# Patient Record
Sex: Female | Born: 1950 | Race: White | Hispanic: No | Smoking: Never smoker
Health system: Southern US, Community
[De-identification: ages and names within clinical notes are randomized; demographics above are authoritative.]

## PROBLEM LIST (undated history)

## (undated) DIAGNOSIS — T8859XA Other complications of anesthesia, initial encounter: Secondary | ICD-10-CM

## (undated) DIAGNOSIS — M797 Fibromyalgia: Secondary | ICD-10-CM

## (undated) DIAGNOSIS — I10 Essential (primary) hypertension: Secondary | ICD-10-CM

## (undated) DIAGNOSIS — G8929 Other chronic pain: Secondary | ICD-10-CM

## (undated) DIAGNOSIS — Z9889 Other specified postprocedural states: Secondary | ICD-10-CM

## (undated) DIAGNOSIS — M549 Dorsalgia, unspecified: Secondary | ICD-10-CM

## (undated) DIAGNOSIS — T4145XA Adverse effect of unspecified anesthetic, initial encounter: Secondary | ICD-10-CM

## (undated) DIAGNOSIS — R112 Nausea with vomiting, unspecified: Secondary | ICD-10-CM

## (undated) DIAGNOSIS — M545 Low back pain, unspecified: Secondary | ICD-10-CM

## (undated) DIAGNOSIS — M542 Cervicalgia: Secondary | ICD-10-CM

## (undated) DIAGNOSIS — D7589 Other specified diseases of blood and blood-forming organs: Secondary | ICD-10-CM

## (undated) DIAGNOSIS — K5090 Crohn's disease, unspecified, without complications: Secondary | ICD-10-CM

## (undated) DIAGNOSIS — D649 Anemia, unspecified: Secondary | ICD-10-CM

## (undated) DIAGNOSIS — K219 Gastro-esophageal reflux disease without esophagitis: Secondary | ICD-10-CM

## (undated) DIAGNOSIS — M8580 Other specified disorders of bone density and structure, unspecified site: Secondary | ICD-10-CM

## (undated) DIAGNOSIS — C4490 Unspecified malignant neoplasm of skin, unspecified: Secondary | ICD-10-CM

## (undated) HISTORY — DX: Fibromyalgia: M79.7

## (undated) HISTORY — DX: Unspecified malignant neoplasm of skin, unspecified: C44.90

## (undated) HISTORY — PX: BREAST EXCISIONAL BIOPSY: SUR124

## (undated) HISTORY — DX: Other specified disorders of bone density and structure, unspecified site: M85.80

## (undated) HISTORY — DX: Other specified diseases of blood and blood-forming organs: D75.89

## (undated) HISTORY — DX: Anemia, unspecified: D64.9

## (undated) HISTORY — DX: Low back pain: M54.5

## (undated) HISTORY — DX: Crohn's disease, unspecified, without complications: K50.90

## (undated) HISTORY — DX: Low back pain, unspecified: M54.50

## (undated) HISTORY — PX: SMALL INTESTINE SURGERY: SHX150

---

## 1988-02-12 HISTORY — PX: APPENDECTOMY: SHX54

## 2003-05-02 ENCOUNTER — Encounter: Admission: RE | Admit: 2003-05-02 | Discharge: 2003-05-02 | Payer: Self-pay | Admitting: Neurological Surgery

## 2004-08-27 ENCOUNTER — Ambulatory Visit: Payer: Self-pay | Admitting: Internal Medicine

## 2004-09-27 ENCOUNTER — Ambulatory Visit: Payer: Self-pay | Admitting: Internal Medicine

## 2005-01-29 ENCOUNTER — Ambulatory Visit: Payer: Self-pay | Admitting: Internal Medicine

## 2005-02-11 HISTORY — PX: OTHER SURGICAL HISTORY: SHX169

## 2005-04-08 ENCOUNTER — Inpatient Hospital Stay (HOSPITAL_COMMUNITY): Admission: EM | Admit: 2005-04-08 | Discharge: 2005-04-16 | Payer: Self-pay

## 2005-04-08 DIAGNOSIS — A4901 Methicillin susceptible Staphylococcus aureus infection, unspecified site: Secondary | ICD-10-CM | POA: Insufficient documentation

## 2005-04-08 DIAGNOSIS — M8618 Other acute osteomyelitis, other site: Secondary | ICD-10-CM | POA: Insufficient documentation

## 2005-04-09 ENCOUNTER — Ambulatory Visit: Payer: Self-pay | Admitting: Internal Medicine

## 2005-05-07 ENCOUNTER — Ambulatory Visit: Payer: Self-pay | Admitting: Infectious Diseases

## 2005-05-13 ENCOUNTER — Ambulatory Visit (HOSPITAL_COMMUNITY): Admission: RE | Admit: 2005-05-13 | Discharge: 2005-05-13 | Payer: Self-pay | Admitting: Anesthesiology

## 2005-06-13 ENCOUNTER — Ambulatory Visit: Payer: Self-pay | Admitting: Internal Medicine

## 2005-06-26 ENCOUNTER — Ambulatory Visit: Payer: Self-pay | Admitting: Infectious Diseases

## 2005-07-01 ENCOUNTER — Encounter: Admission: RE | Admit: 2005-07-01 | Discharge: 2005-07-01 | Payer: Self-pay | Admitting: Neurological Surgery

## 2005-08-05 ENCOUNTER — Ambulatory Visit: Payer: Self-pay | Admitting: Infectious Diseases

## 2005-08-19 ENCOUNTER — Ambulatory Visit: Payer: Self-pay | Admitting: Internal Medicine

## 2005-11-04 ENCOUNTER — Ambulatory Visit: Payer: Self-pay | Admitting: Infectious Diseases

## 2005-11-07 ENCOUNTER — Encounter: Admission: RE | Admit: 2005-11-07 | Discharge: 2005-11-07 | Payer: Self-pay | Admitting: Neurological Surgery

## 2005-11-12 ENCOUNTER — Ambulatory Visit: Payer: Self-pay | Admitting: Infectious Diseases

## 2005-11-12 DIAGNOSIS — M545 Low back pain: Secondary | ICD-10-CM

## 2005-11-19 DIAGNOSIS — IMO0001 Reserved for inherently not codable concepts without codable children: Secondary | ICD-10-CM

## 2005-11-19 DIAGNOSIS — D539 Nutritional anemia, unspecified: Secondary | ICD-10-CM

## 2005-11-19 DIAGNOSIS — R7989 Other specified abnormal findings of blood chemistry: Secondary | ICD-10-CM | POA: Insufficient documentation

## 2006-01-29 ENCOUNTER — Ambulatory Visit: Payer: Self-pay | Admitting: Infectious Diseases

## 2006-03-18 ENCOUNTER — Ambulatory Visit: Payer: Self-pay | Admitting: Infectious Diseases

## 2006-03-18 LAB — CONVERTED CEMR LAB
ALT: 20 units/L (ref 0–35)
AST: 17 units/L (ref 0–37)
Albumin: 4.1 g/dL (ref 3.5–5.2)
Alkaline Phosphatase: 117 units/L (ref 39–117)
BUN: 12 mg/dL (ref 6–23)
Basophils Absolute: 0 10*3/uL (ref 0.0–0.1)
Basophils Relative: 0 % (ref 0–1)
CO2: 27 meq/L (ref 19–32)
Calcium: 9.1 mg/dL (ref 8.4–10.5)
Chloride: 105 meq/L (ref 96–112)
Creatinine, Ser: 0.78 mg/dL (ref 0.40–1.20)
Eosinophils Absolute: 0.1 10*3/uL (ref 0.0–0.7)
Eosinophils Relative: 1 % (ref 0–5)
Glucose, Bld: 106 mg/dL — ABNORMAL HIGH (ref 70–99)
HCT: 36 % (ref 36.0–46.0)
Hemoglobin: 11.6 g/dL — ABNORMAL LOW (ref 12.0–15.0)
Lymphocytes Relative: 26 % (ref 12–46)
Lymphs Abs: 1.9 10*3/uL (ref 0.7–3.3)
MCV: 88.9 fL (ref 78.0–100.0)
Monocytes Absolute: 0.4 10*3/uL (ref 0.2–0.7)
Monocytes Relative: 6 % (ref 3–11)
Neutro Abs: 5.1 10*3/uL (ref 1.7–7.7)
Neutrophils Relative %: 68 % (ref 43–77)
Platelets: 272 10*3/uL (ref 150–400)
Potassium: 3.8 meq/L (ref 3.5–5.3)
RBC: 4.05 M/uL (ref 3.87–5.11)
RDW: 13.7 % (ref 11.5–14.0)
Sed Rate: 48 mm/hr — ABNORMAL HIGH (ref 0–22)
Sodium: 143 meq/L (ref 135–145)
Total Bilirubin: 0.9 mg/dL (ref 0.3–1.2)
Total Protein: 7.3 g/dL (ref 6.0–8.3)
WBC: 7.5 10*3/uL (ref 4.0–10.5)

## 2006-04-16 ENCOUNTER — Ambulatory Visit: Payer: Self-pay | Admitting: Infectious Diseases

## 2006-07-24 ENCOUNTER — Ambulatory Visit: Payer: Self-pay | Admitting: Infectious Diseases

## 2006-07-24 LAB — CONVERTED CEMR LAB
ALT: 14 units/L (ref 0–35)
AST: 15 units/L (ref 0–37)
Albumin: 4.1 g/dL (ref 3.5–5.2)
Alkaline Phosphatase: 98 units/L (ref 39–117)
BUN: 12 mg/dL (ref 6–23)
Basophils Absolute: 0 10*3/uL (ref 0.0–0.1)
Basophils Relative: 0 % (ref 0–1)
CO2: 27 meq/L (ref 19–32)
Calcium: 9.7 mg/dL (ref 8.4–10.5)
Chloride: 104 meq/L (ref 96–112)
Creatinine, Ser: 0.63 mg/dL (ref 0.40–1.20)
Eosinophils Absolute: 0 10*3/uL (ref 0.0–0.7)
Eosinophils Relative: 1 % (ref 0–5)
Glucose, Bld: 93 mg/dL (ref 70–99)
HCT: 36.5 % (ref 36.0–46.0)
Hemoglobin: 11.8 g/dL — ABNORMAL LOW (ref 12.0–15.0)
Lymphocytes Relative: 23 % (ref 12–46)
Lymphs Abs: 1.3 10*3/uL (ref 0.7–3.3)
MCHC: 32.3 g/dL (ref 30.0–36.0)
MCV: 91 fL (ref 78.0–100.0)
Monocytes Absolute: 0.5 10*3/uL (ref 0.2–0.7)
Monocytes Relative: 9 % (ref 3–11)
Neutro Abs: 3.9 10*3/uL (ref 1.7–7.7)
Platelets: 290 10*3/uL (ref 150–400)
Potassium: 3.7 meq/L (ref 3.5–5.3)
RBC: 4.01 M/uL (ref 3.87–5.11)
RDW: 13.9 % (ref 11.5–14.0)
Sed Rate: 41 mm/hr — ABNORMAL HIGH (ref 0–22)
Sodium: 141 meq/L (ref 135–145)
Total Bilirubin: 1.3 mg/dL — ABNORMAL HIGH (ref 0.3–1.2)
Total Protein: 7.3 g/dL (ref 6.0–8.3)
WBC: 5.8 10*3/uL (ref 4.0–10.5)

## 2006-07-30 ENCOUNTER — Ambulatory Visit: Payer: Self-pay | Admitting: Infectious Diseases

## 2006-08-21 ENCOUNTER — Ambulatory Visit: Payer: Self-pay | Admitting: Urgent Care

## 2006-09-11 ENCOUNTER — Ambulatory Visit: Payer: Self-pay | Admitting: Internal Medicine

## 2006-09-11 ENCOUNTER — Ambulatory Visit (HOSPITAL_COMMUNITY): Admission: RE | Admit: 2006-09-11 | Discharge: 2006-09-11 | Payer: Self-pay | Admitting: Internal Medicine

## 2006-09-11 ENCOUNTER — Encounter: Payer: Self-pay | Admitting: Internal Medicine

## 2006-09-11 HISTORY — PX: COLONOSCOPY: SHX174

## 2006-11-11 ENCOUNTER — Ambulatory Visit: Payer: Self-pay | Admitting: Internal Medicine

## 2006-11-11 LAB — CONVERTED CEMR LAB
Basophils Absolute: 0 10*3/uL (ref 0.0–0.1)
Basophils Relative: 0 % (ref 0–1)
Eosinophils Absolute: 0.1 10*3/uL (ref 0.0–0.7)
Eosinophils Relative: 1 % (ref 0–5)
HCT: 37.6 % (ref 36.0–46.0)
Hemoglobin: 12.3 g/dL (ref 12.0–15.0)
Lymphocytes Relative: 23 % (ref 12–46)
Lymphs Abs: 1.4 10*3/uL (ref 0.7–3.3)
MCHC: 32.7 g/dL (ref 30.0–36.0)
MCV: 91.3 fL (ref 78.0–100.0)
Monocytes Absolute: 0.5 10*3/uL (ref 0.2–0.7)
Monocytes Relative: 8 % (ref 3–11)
Neutro Abs: 4.3 10*3/uL (ref 1.7–7.7)
Neutrophils Relative %: 68 % (ref 43–77)
Platelets: 261 10*3/uL (ref 150–400)
RBC: 4.12 M/uL (ref 3.87–5.11)
RDW: 14.1 % — ABNORMAL HIGH (ref 11.5–14.0)
Sed Rate: 34 mm/hr — ABNORMAL HIGH (ref 0–22)
WBC: 6.3 10*3/uL (ref 4.0–10.5)

## 2006-12-01 ENCOUNTER — Ambulatory Visit: Payer: Self-pay | Admitting: Internal Medicine

## 2007-02-03 ENCOUNTER — Ambulatory Visit: Payer: Self-pay | Admitting: Internal Medicine

## 2007-03-12 ENCOUNTER — Ambulatory Visit: Payer: Self-pay | Admitting: Internal Medicine

## 2007-05-15 ENCOUNTER — Ambulatory Visit: Payer: Self-pay | Admitting: Internal Medicine

## 2007-06-16 ENCOUNTER — Ambulatory Visit: Payer: Self-pay | Admitting: Internal Medicine

## 2007-09-11 DIAGNOSIS — L989 Disorder of the skin and subcutaneous tissue, unspecified: Secondary | ICD-10-CM | POA: Insufficient documentation

## 2007-09-11 DIAGNOSIS — K5000 Crohn's disease of small intestine without complications: Secondary | ICD-10-CM

## 2007-09-11 DIAGNOSIS — R197 Diarrhea, unspecified: Secondary | ICD-10-CM

## 2007-09-11 DIAGNOSIS — Z8719 Personal history of other diseases of the digestive system: Secondary | ICD-10-CM | POA: Insufficient documentation

## 2007-09-11 DIAGNOSIS — G061 Intraspinal abscess and granuloma: Secondary | ICD-10-CM | POA: Insufficient documentation

## 2007-09-11 DIAGNOSIS — I10 Essential (primary) hypertension: Secondary | ICD-10-CM | POA: Insufficient documentation

## 2007-09-21 ENCOUNTER — Encounter: Payer: Self-pay | Admitting: Family Medicine

## 2008-04-29 ENCOUNTER — Ambulatory Visit: Payer: Self-pay | Admitting: Internal Medicine

## 2008-04-29 DIAGNOSIS — K219 Gastro-esophageal reflux disease without esophagitis: Secondary | ICD-10-CM

## 2008-04-29 DIAGNOSIS — M949 Disorder of cartilage, unspecified: Secondary | ICD-10-CM

## 2008-04-29 DIAGNOSIS — M899 Disorder of bone, unspecified: Secondary | ICD-10-CM | POA: Insufficient documentation

## 2008-05-03 ENCOUNTER — Encounter: Payer: Self-pay | Admitting: Internal Medicine

## 2008-05-25 ENCOUNTER — Telehealth (INDEPENDENT_AMBULATORY_CARE_PROVIDER_SITE_OTHER): Payer: Self-pay

## 2008-05-26 ENCOUNTER — Telehealth: Payer: Self-pay | Admitting: Internal Medicine

## 2008-06-20 ENCOUNTER — Encounter: Payer: Self-pay | Admitting: Gastroenterology

## 2008-10-03 ENCOUNTER — Encounter (INDEPENDENT_AMBULATORY_CARE_PROVIDER_SITE_OTHER): Payer: Self-pay

## 2008-10-12 ENCOUNTER — Encounter (INDEPENDENT_AMBULATORY_CARE_PROVIDER_SITE_OTHER): Payer: Self-pay | Admitting: *Deleted

## 2008-11-08 ENCOUNTER — Encounter: Payer: Self-pay | Admitting: Internal Medicine

## 2008-11-15 ENCOUNTER — Encounter: Payer: Self-pay | Admitting: Internal Medicine

## 2008-11-15 ENCOUNTER — Ambulatory Visit (HOSPITAL_COMMUNITY): Admission: RE | Admit: 2008-11-15 | Discharge: 2008-11-15 | Payer: Self-pay | Admitting: Internal Medicine

## 2008-11-23 ENCOUNTER — Encounter: Payer: Self-pay | Admitting: Internal Medicine

## 2008-11-23 LAB — CONVERTED CEMR LAB
Basophils Absolute: 0 10*3/uL (ref 0.0–0.1)
Basophils Relative: 0 % (ref 0–1)
Eosinophils Relative: 0 % (ref 0–5)
Lymphocytes Relative: 13 % (ref 12–46)
MCHC: 32.2 g/dL (ref 30.0–36.0)
Monocytes Absolute: 0.6 10*3/uL (ref 0.1–1.0)
Neutro Abs: 7.7 10*3/uL (ref 1.7–7.7)
Platelets: 288 10*3/uL (ref 150–400)
RDW: 14.3 % (ref 11.5–15.5)
Vitamin B-12: 331 pg/mL (ref 211–911)

## 2008-12-26 ENCOUNTER — Encounter: Payer: Self-pay | Admitting: Urgent Care

## 2008-12-28 ENCOUNTER — Encounter (INDEPENDENT_AMBULATORY_CARE_PROVIDER_SITE_OTHER): Payer: Self-pay

## 2009-01-13 ENCOUNTER — Encounter: Payer: Self-pay | Admitting: Internal Medicine

## 2009-01-18 ENCOUNTER — Telehealth (INDEPENDENT_AMBULATORY_CARE_PROVIDER_SITE_OTHER): Payer: Self-pay

## 2009-02-01 ENCOUNTER — Encounter: Payer: Self-pay | Admitting: Internal Medicine

## 2009-02-13 ENCOUNTER — Encounter: Payer: Self-pay | Admitting: Gastroenterology

## 2009-02-15 ENCOUNTER — Encounter: Payer: Self-pay | Admitting: Gastroenterology

## 2009-02-16 ENCOUNTER — Encounter: Payer: Self-pay | Admitting: Gastroenterology

## 2009-02-17 ENCOUNTER — Encounter: Payer: Self-pay | Admitting: Internal Medicine

## 2009-02-17 LAB — CONVERTED CEMR LAB
Basophils Absolute: 0 10*3/uL (ref 0.0–0.1)
Basophils Relative: 0 % (ref 0–1)
Eosinophils Absolute: 0.1 10*3/uL (ref 0.0–0.7)
Eosinophils Relative: 1 % (ref 0–5)
HCT: 32 % — ABNORMAL LOW (ref 36.0–46.0)
Lymphs Abs: 1.6 10*3/uL (ref 0.7–4.0)
MCV: 88.9 fL (ref 78.0–100.0)
Neutrophils Relative %: 63 % (ref 43–77)
Platelets: 260 10*3/uL (ref 150–400)
RDW: 14.1 % (ref 11.5–15.5)
WBC: 5.7 10*3/uL (ref 4.0–10.5)

## 2009-02-27 ENCOUNTER — Encounter: Payer: Self-pay | Admitting: Internal Medicine

## 2009-02-27 LAB — CONVERTED CEMR LAB
Ferritin: 5 ng/mL — ABNORMAL LOW (ref 10–291)
Folate: >20 ng/mL

## 2009-03-20 ENCOUNTER — Encounter: Payer: Self-pay | Admitting: Internal Medicine

## 2009-03-27 LAB — CONVERTED CEMR LAB
Basophils Absolute: 0.1 10*3/uL (ref 0.0–0.1)
Eosinophils Relative: 9 % — ABNORMAL HIGH (ref 0–5)
HCT: 31.3 % — ABNORMAL LOW (ref 36.0–46.0)
Hemoglobin: 10 g/dL — ABNORMAL LOW (ref 12.0–15.0)
Lymphocytes Relative: 22 % (ref 12–46)
Lymphs Abs: 1.4 10*3/uL (ref 0.7–4.0)
Monocytes Absolute: 0.3 10*3/uL (ref 0.1–1.0)
Monocytes Relative: 5 % (ref 3–12)
Neutro Abs: 4 10*3/uL (ref 1.7–7.7)
RBC: 3.54 M/uL — ABNORMAL LOW (ref 3.87–5.11)
RDW: 14.1 % (ref 11.5–15.5)
WBC: 6.2 10*3/uL (ref 4.0–10.5)

## 2009-03-30 ENCOUNTER — Ambulatory Visit: Payer: Self-pay | Admitting: Internal Medicine

## 2009-03-30 DIAGNOSIS — R109 Unspecified abdominal pain: Secondary | ICD-10-CM | POA: Insufficient documentation

## 2009-03-31 ENCOUNTER — Encounter: Payer: Self-pay | Admitting: Internal Medicine

## 2009-03-31 LAB — CONVERTED CEMR LAB
Chloride: 103 meq/L (ref 96–112)
Glucose, Bld: 93 mg/dL (ref 70–99)
Potassium: 3.9 meq/L (ref 3.5–5.3)
Sodium: 138 meq/L (ref 135–145)

## 2009-04-03 ENCOUNTER — Ambulatory Visit (HOSPITAL_COMMUNITY): Admission: RE | Admit: 2009-04-03 | Discharge: 2009-04-03 | Payer: Self-pay | Admitting: Internal Medicine

## 2009-04-07 ENCOUNTER — Encounter: Payer: Self-pay | Admitting: Internal Medicine

## 2009-04-07 ENCOUNTER — Telehealth (INDEPENDENT_AMBULATORY_CARE_PROVIDER_SITE_OTHER): Payer: Self-pay | Admitting: *Deleted

## 2009-04-11 ENCOUNTER — Ambulatory Visit (HOSPITAL_COMMUNITY): Admission: RE | Admit: 2009-04-11 | Discharge: 2009-04-11 | Payer: Self-pay | Admitting: Internal Medicine

## 2009-04-11 HISTORY — PX: ESOPHAGOGASTRODUODENOSCOPY: SHX1529

## 2009-04-13 ENCOUNTER — Encounter: Payer: Self-pay | Admitting: Internal Medicine

## 2009-04-14 ENCOUNTER — Encounter: Payer: Self-pay | Admitting: Internal Medicine

## 2009-04-17 ENCOUNTER — Encounter: Payer: Self-pay | Admitting: Internal Medicine

## 2009-04-21 ENCOUNTER — Ambulatory Visit: Payer: Self-pay | Admitting: Internal Medicine

## 2009-04-25 ENCOUNTER — Ambulatory Visit (HOSPITAL_COMMUNITY): Admission: RE | Admit: 2009-04-25 | Discharge: 2009-04-25 | Payer: Self-pay | Admitting: Internal Medicine

## 2009-05-11 ENCOUNTER — Encounter: Payer: Self-pay | Admitting: Internal Medicine

## 2009-05-16 ENCOUNTER — Encounter (HOSPITAL_COMMUNITY): Admission: RE | Admit: 2009-05-16 | Discharge: 2009-06-15 | Payer: Self-pay | Admitting: Internal Medicine

## 2009-05-16 LAB — CONVERTED CEMR LAB
ALT: 12 units/L (ref 0–35)
AST: 17 units/L (ref 0–37)
Albumin: 4.1 g/dL (ref 3.5–5.2)
Alkaline Phosphatase: 77 units/L (ref 39–117)
Total Bilirubin: 0.8 mg/dL (ref 0.3–1.2)

## 2009-05-25 ENCOUNTER — Encounter (INDEPENDENT_AMBULATORY_CARE_PROVIDER_SITE_OTHER): Payer: Self-pay | Admitting: *Deleted

## 2009-07-11 ENCOUNTER — Telehealth (INDEPENDENT_AMBULATORY_CARE_PROVIDER_SITE_OTHER): Payer: Self-pay | Admitting: *Deleted

## 2009-07-20 ENCOUNTER — Encounter: Payer: Self-pay | Admitting: Gastroenterology

## 2009-07-21 ENCOUNTER — Encounter (INDEPENDENT_AMBULATORY_CARE_PROVIDER_SITE_OTHER): Payer: Self-pay

## 2009-08-16 ENCOUNTER — Encounter: Payer: Self-pay | Admitting: Gastroenterology

## 2009-08-29 LAB — CONVERTED CEMR LAB
ALT: 14 units/L (ref 0–35)
Basophils Absolute: 0 10*3/uL (ref 0.0–0.1)
Eosinophils Absolute: 0.1 10*3/uL (ref 0.0–0.7)
Eosinophils Relative: 2 % (ref 0–5)
HCT: 37 % (ref 36.0–46.0)
Lymphocytes Relative: 24 % (ref 12–46)
MCV: 96.6 fL (ref 78.0–100.0)
Neutrophils Relative %: 65 % (ref 43–77)
Platelets: 253 10*3/uL (ref 150–400)
RDW: 14.1 % (ref 11.5–15.5)
Total Protein: 7.2 g/dL (ref 6.0–8.3)

## 2009-09-12 ENCOUNTER — Ambulatory Visit: Payer: Self-pay | Admitting: Internal Medicine

## 2009-12-29 ENCOUNTER — Encounter (INDEPENDENT_AMBULATORY_CARE_PROVIDER_SITE_OTHER): Payer: Self-pay

## 2010-01-29 ENCOUNTER — Encounter: Payer: Self-pay | Admitting: Internal Medicine

## 2010-01-29 LAB — CONVERTED CEMR LAB
ALT: 14 units/L (ref 0–35)
AST: 15 units/L (ref 0–37)
BUN: 17 mg/dL (ref 6–23)
Basophils Relative: 0 % (ref 0–1)
Bilirubin, Direct: 0.4 mg/dL — ABNORMAL HIGH (ref 0.0–0.3)
CO2: 26 meq/L (ref 19–32)
Chloride: 103 meq/L (ref 96–112)
Glucose, Bld: 96 mg/dL (ref 70–99)
Lymphocytes Relative: 21 % (ref 12–46)
Lymphs Abs: 1.2 10*3/uL (ref 0.7–4.0)
Monocytes Relative: 10 % (ref 3–12)
Neutro Abs: 4 10*3/uL (ref 1.7–7.7)
Neutrophils Relative %: 68 % (ref 43–77)
Potassium: 3.8 meq/L (ref 3.5–5.3)
RBC: 3.76 M/uL — ABNORMAL LOW (ref 3.87–5.11)
WBC: 5.8 10*3/uL (ref 4.0–10.5)

## 2010-02-15 ENCOUNTER — Encounter: Payer: Self-pay | Admitting: Gastroenterology

## 2010-03-13 ENCOUNTER — Encounter (INDEPENDENT_AMBULATORY_CARE_PROVIDER_SITE_OTHER): Payer: Self-pay | Admitting: *Deleted

## 2010-03-13 NOTE — Letter (Signed)
Summary: Recall, Labs Needed  [facility]  [address]   [place], [place] [address]   Phone: [phone]  Fax: [phone]    December 29, 2009  Sheri [name] [address] [place], [place]  [address] 1950/07/16   Dear Ms. Fatheree,   [place] records indicate it is time to repeat your blood work.  You can take the enclosed form to the lab on or near the date indicated.  Please make note of the new location of the lab:   [place], 2nd floor   [place] office will call you within a week to ten business days with the results.  If you do not hear from [place] in 10 business days, you should call the office.  If you have any questions regarding this, call the office at [phone], and ask for the nurse.  Labs are due on 01/15/2010.   Sincerely,    Burnadette Peter LPN  [facility] Ph: [phone]   Fax: [phone]

## 2010-03-13 NOTE — Progress Notes (Signed)
  Phone Note From Other Clinic   Summary of Call: pt showed up at 1145 and said she had a 1130 appt w/RMR. Her appt was at 1045 and was marked as a no show. Pt argued that it was at 1130 and she had to wait 7 weeks to see RMR. KJ offered to see her at 2pm today, but patient said no. I was getting ready to offer her appt with RMR for tomorrow, but patient got mad and left. Initial call taken by: Zeb Comfort,  Jul 11, 2009 12:03 PM

## 2010-03-13 NOTE — Medication Information (Signed)
Summary: Visual merchandiser   Imported By: Zeb Comfort 02/15/2009 08:34:01  _____________________________________________________________________  External Attachment:    Type:   Image     Comment:   External Document  Appended Document: RX Folder azathiopurine    Prescriptions: AZATHIOPRINE 50 MG TABS (AZATHIOPRINE) take 2 by mouth daily  #60 x 5   Entered and Authorized by:   Laureen Ochs. Bernarda Caffey   Signed by:   Laureen Ochs Bernarda Caffey on 02/15/2009   Method used:   Electronically to        [facility]* (retail)       [address], [place]  [address]       Ph: [phone]       Fax: [phone]   RxID:   [id number]

## 2010-03-13 NOTE — Letter (Signed)
Summary: RELEASE OF INFO  RELEASE OF INFO   Imported By: Zeb Comfort 03/31/2009 14:13:36  _____________________________________________________________________  External Attachment:    Type:   Image     Comment:   External Document

## 2010-03-13 NOTE — Miscellaneous (Signed)
Summary: Orders Update  Clinical Lists Changes  Problems: Added new problem of ENCOUNTER FOR LONG-TERM USE OF OTHER MEDICATIONS (ICD-V58.69) Orders: Added new Test order of T-CBC w/Diff [phone]) - Signed Added new Test order of T-Hepatic Function [phone]) - Signed

## 2010-03-13 NOTE — Letter (Signed)
Summary: Recall, Labs Needed  [facility]  [address]   [place], [place] [address]   Phone: [phone]  Fax: [phone]    July 21, 2009  Sheri [name] [address] [place], [place]  [address] November 24, 1950   Dear [name],   [place] records indicate it is time to repeat your blood work.  You can take the enclosed form to the lab on or near the date indicated.  Please make note of the new location of the lab:   [place], 2nd floor   [place] office will call you within a week to ten business days with the results.  If you do not hear from [place] in 10 business days, you should call the office.  If you have any questions regarding this, call the office at [phone], and ask for the nurse.  Labs are due on 07/27/2009.   Sincerely,    Burnadette Peter LPN  [facility] Ph: [phone]   Fax: [phone]

## 2010-03-13 NOTE — Letter (Signed)
Summary: PROMETHEUS TPMT  PROMETHEUS TPMT   Imported By: Zeb Comfort 04/14/2009 09:14:46  _____________________________________________________________________  External Attachment:    Type:   Image     Comment:   External Document

## 2010-03-13 NOTE — Medication Information (Signed)
Summary: Visual merchandiser   Imported By: Zeb Comfort 02/16/2009 09:47:04  _____________________________________________________________________  External Attachment:    Type:   Image     Comment:   External Document  Appended Document: RX Folder I gave 11 RFs on 02/13/09.  Please find out what's going on with this. This is third request.  Appended Document: RX Folder spoke to pharmacist- he has already filled this for pt, not sure why we keep getting request, but he will check on it.

## 2010-03-13 NOTE — Progress Notes (Signed)
Summary: EGD procedure   ---- Converted from flag ---- ---- 04/06/2009 12:41 PM, Sheri Meuse FNP-BC wrote: PLease set up EGD w/ RMR EV:[id number] nausea, anemia, hx Crohn's.THX ------------------------------

## 2010-03-13 NOTE — Assessment & Plan Note (Signed)
Summary: fu ov with RMR per LSL,monitor lft's,hx crohn's/ams   Visit Type:  Follow-up Visit Primary Care Provider:  Dr. Rowe Pavy  Chief Complaint:  F/U crohn's.  History of Present Illness: 60 year old with ileocolonic Crohn's disease now in remission on azathioprine and Pentasa. Has had some nausea and right-sided abdominal pain workup with ultrasound and HIDA both negative EGD also negative (non-critical  Schatzki's ring) doing very well now having about 4 nonbloody bowel movements daily; no abdominal pain; no vomiting no fever no chills. Reflux symptoms well-controlled with Prilosec 20 mg orally daily    Current Medications (verified): 1)  Percocet 10-325 Mg Tabs (Oxycodone-Acetaminophen) .... One or Two Every 6 Hours As Needed 2)  Pentasa 500 Mg  Cpcr (Mesalamine) .... Take 2 Capsules By Mouth Four Times A Day 3)  Azathioprine 50 Mg Tabs (Azathioprine) .... Take 2 By Mouth Daily 4)  Prilosec 20 Mg  Cpdr (Omeprazole) .... Take 1 Capsule By Mouth Once A Day 5)  Flexeril 10 Mg  Tabs (Cyclobenzaprine Hcl) .... Take 1 Tablet By Mouth Three Times A Day As Needed 6)  Celexa 20 Mg  Tabs (Citalopram Hydrobromide) .... Take 1 Tablet By Mouth Two Times A Day 7)  B12 Injection .... Once Every Month 8)  Fish Oil   Oil (Fish Oil) .... Once Daily 9)  Zinc 50 Mg  Tabs (Zinc) .... Once Daily 10)  Alprazolam 1 Mg  Tabs (Alprazolam) .... Take 1 Tablet By Mouth Two Times A Day 11)  Alendronate Sodium 70 Mg  Tabs (Alendronate Sodium) .... Every Week 12)  Calcium 600 Mg With D .... Take 1 Tablet Twice Daily 13)  Lisinopril-Hydrochlorothiazide 20-12.5 Mg  Tabs (Lisinopril-Hydrochlorothiazide) .... Take 1 Tablet By Mouth Once A Day 14)  Florajen 3 .... Once Daily 15)  Loratadine 10 Mg Tabs (Loratadine) .... Once Daily 16)  Multi Vitamin For Women Over 98 .... Take 1 Tablet By Mouth Once A Day 17)  Vitamin E .... Once Daily 18)  Zofran 4 Mg Tabs (Ondansetron Hcl) .... One By Mouth Q4-6 Hrs As Needed  N/v  Allergies (verified): 1)  ! Codeine Sulfate (Codeine Sulfate)  Past History:  Past Medical History: Last updated: May 29, 2008 Crohn's Disease Anemia-NOS Low back pain Fibromyalgia  Past Surgical History: Last updated: May 29, 2008 Status post small bowel resection in 1984 and 1995 Status post C-section in 1990 and 1992 Status post appendectomy in 1990 Status post drainage of back abscess on 2007  Family History: Last updated: 29-May-2008 Father: deceased  MI Mother: deceased  Stomach cancer Siblings: 0  Social History: Last updated: 2008-05-29 Marital Status: separated Children: 2 Occupation: Disabled Patient is a former smoker.  Alcohol Use - yes  Vital Signs:  Patient profile:   59 year old female Height:      70 inches Weight:      200.50 pounds BMI:     28.87 Temp:     98.7 degrees F oral Pulse rate:   64 / minute BP sitting:   140 / 78  (left arm) Cuff size:   regular  Vitals Entered By: Waldon Merl LPN (September 12, 4429 11:07 AM)  Physical Exam  General:  pleasant alert lady in no acute distress Eyes:  no scleral icterus Lungs:  clear to auscultation Heart:  regular rate and rhythm without murmur gallop rub Abdomen:  nondistended positive bowel sounds soft nontender without appreciable mass or organomegaly  Impression & Recommendations: Impression: Ileocolonic Crohn's disease in remission on current regimen.  Intermittent nausea  about 2 days out of the month - nonspecific, but overall doing well. GERD symptoms well controlled on Prilosec negative gallbladder evaluation  Recommendations: Continue azathioprine and Pentasa  Continue Prilosec  labs in 4 months; office visit in 6 months.  Other Orders: Future Orders: T-Hepatic Function [phone]) ... 01/15/2010 T-CBC w/Diff ([id number]) ... 82/99/3716 T-Basic Metabolic Panel ([id number]) ... 01/15/2010  Appended Document: Orders Update    Clinical Lists Changes  Orders: Added new  Service order of Est. Patient Level III ([address]) - Signed      Appended Document: fu ov with RMR per LSL,monitor lft's,hx crohn's/ams reminder in computer

## 2010-03-13 NOTE — Miscellaneous (Signed)
Summary: Orders Update  Clinical Lists Changes  Orders: Added new Test order of T-CBC w/Diff [phone]) - Signed

## 2010-03-13 NOTE — Letter (Signed)
Summary: ABD U/S ORDER  ABD U/S ORDER   Imported By: Sofie Rower 04/17/2009 09:47:19  _____________________________________________________________________  External Attachment:    Type:   Image     Comment:   External Document

## 2010-03-13 NOTE — Miscellaneous (Signed)
Summary: Orders Update  Clinical Lists Changes  Orders: Added new Test order of T-Hepatic Function ([id number]) - Signed 

## 2010-03-13 NOTE — Letter (Signed)
Summary: EGD ORDER  EGD ORDER   Imported By: Sofie Rower 04/07/2009 11:35:41  _____________________________________________________________________  External Attachment:    Type:   Image     Comment:   External Document

## 2010-03-13 NOTE — Letter (Signed)
Summary: CT SCAN ORDER  CT SCAN ORDER   Imported By: Sofie Rower 03/30/2009 12:31:51  _____________________________________________________________________  External Attachment:    Type:   Image     Comment:   External Document

## 2010-03-13 NOTE — Medication Information (Signed)
Summary: Visual merchandiser   Imported By: Sherry Ruffing 07/20/2009 13:32:28  _____________________________________________________________________  External Attachment:    Type:   Image     Comment:   External Document  Appended Document: RX Folder-azathioprine    Prescriptions: AZATHIOPRINE 50 MG TABS (AZATHIOPRINE) take 2 by mouth daily  #60 x 5   Entered and Authorized by:   Laureen Ochs. Bernarda Caffey   Signed by:   Laureen Ochs Bernarda Caffey on 07/20/2009   Method used:   Electronically to        [facility] (retail)       [address], [place]  [address]       Ph: [phone]       Fax: [phone]   RxID:   [id number]   Needs CBC, LFTs for monitoring (high risk med) Needs OV with RMR  Appended Document: RX Folder lab order mailed to pt  Appended Document: RX Folder Ov sch for 09/12/2009 w/ RMR.  Pt aware.  AS

## 2010-03-13 NOTE — Letter (Signed)
Summary: bone density  bone density   Imported By: Stephan Minister 03/20/2009 15:06:07  _____________________________________________________________________  External Attachment:    Type:   Image     Comment:   External Document

## 2010-03-13 NOTE — Letter (Signed)
Summary: HIDA SCAN ORDER  HIDA SCAN ORDER   Imported By: Sofie Rower 05/11/2009 14:06:16  _____________________________________________________________________  External Attachment:    Type:   Image     Comment:   External Document

## 2010-03-13 NOTE — Medication Information (Signed)
Summary: Visual merchandiser   Imported By: Zeb Comfort 02/13/2009 09:00:29  _____________________________________________________________________  External Attachment:    Type:   Image     Comment:   External Document  Appended Document: RX Folder - pentasa    Prescriptions: PENTASA 500 MG  CPCR (MESALAMINE) Take 2 capsules by mouth four times a day  #240 x 11   Entered and Authorized by:   Laureen Ochs. Bernarda Caffey   Signed by:   Laureen Ochs Bernarda Caffey on 02/14/2009   Method used:   Electronically to        [facility] (retail)       [address], [place]  [address]       Ph: [phone]       Fax: [phone]   RxID:   [id number]

## 2010-03-13 NOTE — Miscellaneous (Signed)
Summary: Orders Update  Clinical Lists Changes  Orders: Added new Test order of T-B12, Serum Total Only [phone]) - Signed Added new Test order of T-Ferritin [phone]) - Signed Added new Test order of T-Folate [id number]) - Signed Added new Test order of T-Iron [phone]) - Signed Added new Test order of T-Iron Binding Capacity (TIBC) ([id number]) - Signed

## 2010-03-13 NOTE — Medication Information (Signed)
Summary: Visual merchandiser   Imported By: Orma Flaming 08/16/2009 09:09:50  _____________________________________________________________________  External Attachment:    Type:   Image     Comment:   External Document

## 2010-03-13 NOTE — Assessment & Plan Note (Signed)
Summary: fu in one month with extenders/anemia/ss   Visit Type:  Follow-up Visit Primary Care Provider:  Rowe Pavy  Chief Complaint:  F/U anemia.  History of Present Illness: 60 y/o caucasian female w/ hx ileocolonic crohn's.  c/o fatigue.  Hgb 10 grams, down from 10.4.  BM 4-6 per day, loose to partially formed.  No blood or mucous.  c/o extreme bloating.  Chronic pain RLQ 3-5/10, intermittant.  About 2 weeks ago, 2 week hx of mid-abd pain 10/10.  6 month hx worsening,  c/o nausea, denies vomiting.  Appetite ok.  Weight  stable.  Denies fever. Taking pentasa &  azathioprine.  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 One or Two Every 6 Hours As Needed 2)  Pentasa 500 Mg  Cpcr (Mesalamine) .... Take 2 Capsules By Mouth Four Times A Day 3)  Azathioprine 50 Mg Tabs (Azathioprine) .... Take 2 By Mouth Daily 4)  Prilosec 20 Mg  Cpdr (Omeprazole) .... Take 1 Capsule By Mouth Once A Day 5)  Flexeril 10 Mg  Tabs (Cyclobenzaprine Hcl) .... Take 1 Tablet By Mouth Three Times A Day As Needed 6)  Celexa 20 Mg  Tabs (Citalopram Hydrobromide) .... Take 1 Tablet By Mouth Two Times A Day 7)  B12 Injection .... Once Every Month 8)  Fish Oil   Oil (Fish Oil) .... Once Daily 9)  Zinc 50 Mg  Tabs (Zinc)  .... Once Daily 10)  Alprazolam 1 Mg  Tabs (Alprazolam) .... Take 1 Tablet By Mouth Two Times A Day 11)  Alendronate Sodium 70 Mg  Tabs (Alendronate Sodium) .... Every Week 12)  Calcium 600 Mg With D .... Take 1 Tablet Twice Daily 13)  Lisinopril-Hydrochlorothiazide 20-12.5 Mg  Tabs (Lisinopril-Hydrochlorothiazide) .... Take 1 Tablet By Mouth Once A Day 14)  Florajen 3 .... Once Daily 15)  Loratadine 10 Mg Tabs (Loratadine) .... Once Daily 16)  Multi Vitamin For Women Over 70 .... Take 1 Tablet By Mouth Once A Day 17)  Vitamin E .... Once Daily 18)  Hemocyte 324 Mg Tabs (Ferrous Fumarate) .... One By Mouth Daily  Allergies (verified): 1)  ! Codeine Sulfate (Codeine Sulfate)  Past History:  Past Medical History: Last updated: 04/29/2008 Crohn's Disease Anemia-NOS Low back pain Fibromyalgia  Past Surgical History: Last updated: 04/29/2008 Status post small bowel resection in 1984 and 1995 Status post C-section in 1990 and 1992 Status post appendectomy in 1990 Status post drainage of back abscess on 2007  Review of Systems      See HPI General:  Complains of fatigue; denies fever, chills, sweats, anorexia, weakness, malaise, weight loss, and sleep disorder. CV:  Denies chest pains, angina, palpitations, syncope, dyspnea on exertion, orthopnea, PND, peripheral edema, and claudication. Resp:  Denies dyspnea at rest, dyspnea with exercise, cough,  sputum, wheezing, coughing up blood, and pleurisy. GI:  Complains of gas/bloating; denies jaundice, constipation, and fecal incontinence. GU:  Denies urinary burning, blood in urine, nocturnal urination, urinary frequency, and urinary incontinence. Derm:  Denies rash, itching, dry skin, hives, moles, warts, and unhealing ulcers. Psych:  Denies depression, anxiety, memory loss, suicidal ideation, hallucinations, paranoia, phobia, and confusion. Heme:  Denies bruising and enlarged lymph nodes.  Vital Signs:  Patient profile:   60 year old  female Height:      70 inches Weight:      199 pounds BMI:     28.66 Temp:     98.7 degrees F oral Pulse rate:   56 / minute BP sitting:   120 / 62  (left arm) Cuff size:   regular  Vitals Entered By: Waldon Merl LPN (March 30, 3417 11:43 AM)  Physical Exam  General:  Well developed, well nourished, no acute distress. Head:  Normocephalic and atraumatic. Eyes:  Sclera clear, no icterus. Ears:  Normal auditory acuity. Nose:  No deformity, discharge,  or lesions. Mouth:  No deformity or lesions, dentition normal. Neck:  Supple; no masses or thyromegaly. Heart:  Regular rate and rhythm; no murmurs, rubs,  or bruits. Abdomen:  normal bowel sounds, RLQ tenderness, without guarding, without rebound, no masses, no hepatomegally or splenomegaly, and periumbilical tenderness.   Msk:  Symmetrical with no gross deformities. Normal posture. Pulses:  Normal pulses noted. Extremities:  No clubbing, cyanosis, edema or deformities noted. Neurologic:  Alert and  oriented x4;  grossly normal neurologically. Skin:  Intact without significant lesions or rashes. Cervical Nodes:  No significant cervical adenopathy. Psych:  Alert and cooperative. Normal mood and affect.  Impression & Recommendations:  Problem # 1:  CROHN'S DISEASE, SMALL INTESTINE (ICD-555.0)  Recent worsening of symptoms w/ RLQ to mid-abd pain.  Anemia hgb 10g, with steady decline. ? Crohn's flare.  ?appropriate dosing azathioprine.  Proceed w/ CT abd/pelvis w/ IV/oral contrast for further evaluation.  Check TPMT enzyme activity.  Orders: Est. Patient Level III ([address]902)  Problem # 2:  ANEMIA, NORMOCHROMIC (ICD-281.9) See #1 Orders: T-Basic Metabolic Panel ([id number]) Est. Patient Level III ([address])  Problem # 3:  ABDOMINAL PAIN (ICD-789.00) see #1  Patient Instructions: 1)  I will call you w/ CT & labwork results 2)  Go get your labs today 3)  Begin iron 325 mg daily 4)  Continue calcium, vit D, Multivitamin &  fosamax 5)  The medication list was reviewed and reconciled.  All changed / newly prescribed medications were explained.  A complete medication list was provided to the patient / caregiver. Prescriptions: HEMOCYTE 324 MG TABS (FERROUS FUMARATE) one by mouth daily  #31 x 2   Entered and Authorized by:   Andria Meuse FNP-BC   Signed by:   Andria Meuse FNP-BC on 03/30/2009   Method used:   Electronically to        [facility] (retail)       [address], [place]  [address]       Ph: [phone]       Fax: [phone]   RxID:   [id number]   Appended Document: fu in one month with extenders/anemia/ss Pt CT ok.  With continued nausea.  Disc w/ Dr Gala Romney.  Given zofran.  Plan EGD w/ possible SB Bx CH:YIFOYD, nausea, Crohn's.  Appended Document: fu in one month  with extenders/anemia/ss EGD to be performed by Dr. Bridgette Habermann in the near future.  I have discussed risks and benefits which include, but are not limited to, bleeding, infection, perforation, or medication reaction.  The patient agrees with this plan and consent will be obtained.

## 2010-03-13 NOTE — Letter (Signed)
Summary: Appointment Reminder  [facility]  [address]   [place], [place] [address]   Phone: [phone]  Fax: [phone]       May 25, 2009   Sheri [name] [address] [facility] [place], [place]  [address] 1950/02/22    Dear Ms. Pogue,  We have been unable to reach you by phone to schedule a follow up   appointment that was recommended for you by Dr. Gala Romney. It is very   important that we reach you to schedule an appointment. We hope that you  allow [place] to participate in your health care needs. Please contact us at  [phone] at your earliest convenience to schedule your appointment.  Sincerely,    [facility] R. Garfield Cornea, M.D.    Barney Drain, M.D. Vickey Huger, FNP-BC    Neil Crouch, PA-C Phone: [phone]    Fax: [phone]

## 2010-03-13 NOTE — Medication Information (Signed)
Summary: Visual merchandiser   Imported By: Zeb Comfort 02/15/2009 08:35:35  _____________________________________________________________________  External Attachment:    Type:   Image     Comment:   External Document  Appended Document: RX Folder done yesterday

## 2010-03-15 NOTE — Miscellaneous (Signed)
Summary: Orders Update  Clinical Lists Changes  Orders: Added new Test order of T-CBC w/Diff [id number]) - Signed Added new Test order of T-Hepatic Function [phone]) - Signed

## 2010-03-15 NOTE — Medication Information (Signed)
Summary: AZATHIOPRINE 50MG  AZATHIOPRINE 50MG   Imported By: Hoy Morn 02/15/2010 08:46:53  _____________________________________________________________________  External Attachment:    Type:   Image     Comment:   External Document  Appended Document: AZATHIOPRINE 50MG was refilled in early December X 5. pleas inform pharmacy  Appended Document: AZATHIOPRINE 50MG pharmacy aware

## 2010-03-15 NOTE — Medication Information (Signed)
Summary: PENTASA 500MG  PENTASA 500MG   Imported By: Hoy Morn 02/15/2010 08:45:58  _____________________________________________________________________  External Attachment:    Type:   Image     Comment:   External Document  Appended Document: PENTASA 500MG refilled X 11 in early December. Please inform pharmacy.  Appended Document: PENTASA 500MG pharmacy aware

## 2010-03-21 NOTE — Letter (Signed)
Summary: Recall Office Visit  [facility]  [address]   [place], [place] [address]   Phone: [phone]  Fax: [phone]      March 13, 2010   Sheri [name] [address] [facility] [place], [place]  [address] Jul 20, 1950   Dear Ms. Pinedo,   According to our records, it is time for you to schedule a follow-up office visit with [place].   At your convenience, please call [phone] to schedule an office visit. If you have any questions, concerns, or feel that this letter is in error, we would appreciate your call.   Sincerely,    [facility] Ph: [phone]   Fax: [phone]

## 2010-03-30 ENCOUNTER — Encounter: Payer: Self-pay | Admitting: Internal Medicine

## 2010-04-04 NOTE — Letter (Signed)
Summary: DISABILITY DETERMINATION  DISABILITY DETERMINATION   Imported By: Hoy Morn 03/30/2010 11:20:36  _____________________________________________________________________  External Attachment:    Type:   Image     Comment:   External Document

## 2010-04-06 ENCOUNTER — Encounter: Payer: Self-pay | Admitting: Internal Medicine

## 2010-04-10 NOTE — Letter (Signed)
Summary: DISABILITY DETERMINATION SERVICES  DISABILITY DETERMINATION SERVICES   Imported By: Hoy Morn 04/06/2010 11:45:16  _____________________________________________________________________  External Attachment:    Type:   Image     Comment:   External Document

## 2010-04-12 ENCOUNTER — Encounter (INDEPENDENT_AMBULATORY_CARE_PROVIDER_SITE_OTHER): Payer: Self-pay | Admitting: *Deleted

## 2010-04-13 ENCOUNTER — Encounter: Payer: Self-pay | Admitting: Gastroenterology

## 2010-04-17 ENCOUNTER — Ambulatory Visit: Payer: Self-pay | Admitting: Gastroenterology

## 2010-04-18 ENCOUNTER — Encounter: Payer: Self-pay | Admitting: Gastroenterology

## 2010-04-18 ENCOUNTER — Ambulatory Visit (INDEPENDENT_AMBULATORY_CARE_PROVIDER_SITE_OTHER): Payer: Medicare Other | Admitting: Gastroenterology

## 2010-04-18 DIAGNOSIS — Z79899 Other long term (current) drug therapy: Secondary | ICD-10-CM

## 2010-04-18 DIAGNOSIS — K219 Gastro-esophageal reflux disease without esophagitis: Secondary | ICD-10-CM

## 2010-04-18 DIAGNOSIS — Z8719 Personal history of other diseases of the digestive system: Secondary | ICD-10-CM

## 2010-04-18 DIAGNOSIS — K5000 Crohn's disease of small intestine without complications: Secondary | ICD-10-CM

## 2010-04-19 NOTE — Letter (Signed)
Summary: Recall, Labs Needed  [facility]  [address]   [place], [place] [address]   Phone: [phone]  Fax: [phone]    April 12, 2010  Sheri [name] [address] [place], [place]  [address] 1951/01/09   Dear Ms. Mikelson,   [facility] records indicate it is time to repeat your blood work.  You can take the enclosed form to the lab on or near the date indicated.  Please make note of the new location of the lab:   [place], 2nd floor   [place] office will call you within a week to ten business days with the results.  If you do not hear from [place] in 10 business days, you should call the office.  If you have any questions regarding this, call the office at [phone], and ask for the nurse.  Labs are due on 30 April 2010  Sincerely,    [facility] Ph: [phone]   Fax: [phone]

## 2010-04-24 NOTE — Assessment & Plan Note (Addendum)
Summary: FU CROHNS   Vital Signs:  Patient profile:   60 year old female Height:      70 inches Weight:      208 pounds BMI:     29.95 Temp:     97.6 degrees F oral Pulse rate:   92 / minute BP sitting:   122 / 70  (left arm)  Vitals Entered By: Loney Loh LPN (April 17, 7060 3:76 PM)  Visit Type:  f/u Primary Care Provider:  none  Chief Complaint:  Crohn's.  History of Present Illness: Sheri [name] is here for f/u of Crohn's. She was last seen in 8/11. She has been doing well overall. BMs at baseline from 3-5 daily and rarely more. Two weeks ago she had few episodes of brbpr on toilet tissue. RLQ abd pain mild and intermittent. No n/v, heartburn is controlled. No weight loss. She is mostly concerned that she has not received her B12 shot since 11/11 when Dr. Rowe Pavy closed his practice. She has been unable to establish care with PCP. She is going to try to see Dr. Octavio Graves. She is also almost out of several RXs.   Current Medications (verified): 1)  Percocet 10-325 Mg Tabs (Oxycodone-Acetaminophen) .... One or Two Every 6 Hours As Needed 2)  Pentasa 500 Mg  Cpcr (Mesalamine) .... Take 2 Capsules By Mouth Four Times A Day 3)  Azathioprine 50 Mg Tabs (Azathioprine) .... Take 2 By Mouth Daily 4)  Prilosec 20 Mg  Cpdr (Omeprazole) .... Take 1 Capsule By Mouth Once A Day 5)  Flexeril 10 Mg  Tabs (Cyclobenzaprine Hcl) .... Take 1 Tablet By Mouth Three Times A Day As Needed 6)  Celexa 20 Mg  Tabs (Citalopram Hydrobromide) .... Take 1 Tablet By Mouth Two Times A Day 7)  B12 Injection .... Once Every Month 8)  Fish Oil   Oil (Fish Oil) .... Once Daily 9)  Zinc 50 Mg  Tabs (Zinc) .... Once Daily 10)  Alprazolam 1 Mg  Tabs (Alprazolam) .... Take 1 Tablet By Mouth Two Times A Day 11)  Alendronate Sodium 70 Mg  Tabs (Alendronate Sodium) .... Every Week 12)  Calcium 600 Mg With D .... Take 1 Tablet Twice Daily 13)  Lisinopril-Hydrochlorothiazide 20-12.5 Mg  Tabs  (Lisinopril-Hydrochlorothiazide) .... Take 1 Tablet By Mouth Once A Day 14)  Loratadine 10 Mg Tabs (Loratadine) .... Once Daily 15)  Multi Vitamin For Women Over 43 .... Take 1 Tablet By Mouth Once A Day 16)  Vitamin E .... Once Daily 17)  Zofran 4 Mg Tabs (Ondansetron Hcl) .... One By Mouth Q4-6 Hrs As Needed N/v  Allergies: 1)  ! Codeine Sulfate (Codeine Sulfate)  Review of Systems      See HPI  Physical Exam  General:  Well developed, well nourished, no acute distress. Head:  Normocephalic and atraumatic. Eyes:  sclera nonicteric Mouth:  op moist Abdomen:  Mild RLQ tenderness. No rebound or guarding. No HSM or masses. No abd bruit or hernia. Rectal:  hemocult negative.  Skin tag. Small palpable lesion within anal canal likely hemorrhoid. Extremities:  No clubbing, cyanosis, edema or deformities noted. Neurologic:  Alert and  oriented x4;  grossly normal neurologically. Skin:  Intact without significant lesions or rashes. Psych:  Alert and cooperative. Normal mood and affect.   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 Continue Imuran and Pentasa. She will have her CBC and LFTs today  for three month check.  Orders: Est. Patient Level III (37902) Hemoccult Guaiac-1 spec.(in office) ([address]2270)  Problem # 2:  RECTAL BLEEDING, HX OF (ICD-V12.79)  Likely from hemorrhoids. Last TCS 2008. Will discuss with Dr. Gala Romney regarding timing of next TCS. Treat hemorrhoids.   Orders: Est. Patient Level III (40973) Hemoccult Guaiac-1 spec.(in office) (82270)  Problem # 3:  GERD (ICD-530.81)  Well-controlled.  Orders: Est. Patient Level III (53299)  Problem # 4:  ENCOUNTER FOR LONG-TERM USE OF OTHER MEDICATIONS (ICD-V58.69)  She is out of her chronic meds. No PCP yet. Will give her one time refill. She also needs her B12 injection. She will check with [facility] to see if they can assist with injection otherwise  she let us know if she needs further help.  Orders: Est. Patient Level III (24268) Prescriptions: HYDROCORTISONE ACETATE 25 MG SUPP (HYDROCORTISONE ACETATE) one pr at bedtime X 14 days  #14 x 0   Entered and Authorized by:   Sheri [name]   Signed by:   Sheri Ochs Bernarda [name] on 04/18/2010   Method used:   Electronically to        [facility] (retail)       [address], [place]  [address]       Ph: [phone]       Fax: [phone]   RxID:   [id number] ALENDRONATE SODIUM 70 MG  TABS (ALENDRONATE SODIUM) once every week  #4 x 0   Entered and Authorized by:   Sheri [name]   Signed by:   Sheri Ochs Bernarda [name] on 04/18/2010   Method used:   Electronically to        [facility] (retail)       [address], [place]  [address]       Ph: [phone]       Fax: [phone]   RxID:   [id number] LISINOPRIL-HYDROCHLOROTHIAZIDE 20-12.5 MG  TABS (LISINOPRIL-HYDROCHLOROTHIAZIDE) Take 1 tablet by mouth once a day  #30 x 0   Entered and Authorized by:   Sheri [name]   Signed by:   Sheri Ochs Bernarda [name] on 04/18/2010   Method used:   Electronically to        [facility] (retail)       [address], [place]  [address]       Ph: [phone]       Fax: [phone]   RxID:   [id number] CELEXA 20 MG  TABS (CITALOPRAM HYDROBROMIDE) Take 1 tablet by mouth two times a day  #60 x 0   Entered and Authorized by:   Sheri [name]   Signed by:   Sheri Ochs Bernarda [name] on 04/18/2010   Method used:   Electronically to        [facility] (retail)       [address], [place]  [address]       Ph: [phone]       Fax: [phone]   RxID:   [id number] PRILOSEC 20 MG  CPDR (OMEPRAZOLE) Take 1 capsule by mouth  once a day  #30 x 11   Entered and Authorized by:   Sheri Ochs.  Bernarda [name]   Signed by:   Sheri Ochs Bernarda [name] on 04/18/2010   Method used:   Electronically to        [facility] (retail)       [address], [place]  [address]       Ph: [phone]       Fax: [phone]   RxID:   [id number] ALPRAZOLAM 1 MG  TABS (ALPRAZOLAM) Take 1 tablet by mouth two times a day  #60 x 0   Entered and Authorized by:   Sheri [name]   Signed by:   Sheri Ochs Luby Seamans PA-C on 04/18/2010   Method used:   Print then Give to Patient   RxID:   [id number]    Orders Added: 1)  Est. Patient Level III [50388] 2)  Hemoccult Guaiac-1 spec.(in office) [82270]  Appended Document: FU CROHNS See when she plans to have labs done. She also needs f/u ov in 4 months  Appended Document: FU CROHNS reminder in epic to fu with RMR in Sept 2012

## 2010-04-30 ENCOUNTER — Encounter: Payer: Self-pay | Admitting: Internal Medicine

## 2010-04-30 LAB — CONVERTED CEMR LAB
ALT: 16 units/L (ref 0–35)
AST: 19 units/L (ref 0–37)
Albumin: 4.2 g/dL (ref 3.5–5.2)
Eosinophils Absolute: 0.1 10*3/uL (ref 0.0–0.7)
Eosinophils Relative: 1 % (ref 0–5)
HCT: 36.2 % (ref 36.0–46.0)
Lymphs Abs: 1.4 10*3/uL (ref 0.7–4.0)
MCHC: 34 g/dL (ref 30.0–36.0)
MCV: 94.5 fL (ref 78.0–100.0)
Monocytes Relative: 9 % (ref 3–12)
Platelets: 231 10*3/uL (ref 150–400)
Total Bilirubin: 1.1 mg/dL (ref 0.3–1.2)
WBC: 6.1 10*3/uL (ref 4.0–10.5)

## 2010-05-10 NOTE — Letter (Signed)
Summary: Normal Results Letter  [facility]  [address]   [place], [place] [address]   Phone: [phone]  Fax: [phone]    April 30, 2010  Sheri [name] [address] [place], [place]  [address]  [place] 1950/02/27   Dear [name],    We just wanted to inform you that all of your tests were normal.  Please repeat in 6 months. We will send you that order in the mail. If you have any questions, please call the office at [phone].   Thank you,    Burnadette Peter, LPN [facility], [facility] Ph: [phone]   Fax: [phone]

## 2010-05-10 NOTE — Miscellaneous (Signed)
Summary: Orders Update  Clinical Lists Changes  Orders: Added new Test order of T-CBC w/Diff [phone]) - Signed Added new Test order of T-Hepatic Function [phone]) - Signed

## 2010-05-16 ENCOUNTER — Other Ambulatory Visit: Payer: Self-pay | Admitting: Gastroenterology

## 2010-05-16 ENCOUNTER — Telehealth: Payer: Self-pay

## 2010-05-16 NOTE — Telephone Encounter (Signed)
See prior note

## 2010-05-16 NOTE — Telephone Encounter (Signed)
RMR pt. Pt needs to see PCP about meds.

## 2010-05-16 NOTE — Telephone Encounter (Signed)
Pt called- stated [facility] refilled all her medications at her last ov, with the understanding pt was to find a new pcp. Pt said she is going to have pharmacy fax refill request to [place] because she is in the process of getting established with Dr. Melina Copa and is requesting we refill one more time. She said she has already filled out forms and is waiting for an appt at Dr. Marisa Hua office. Pt also said [facility] has been giving her the B-12 injections.

## 2010-05-17 MED ORDER — ALPRAZOLAM 1 MG PO TABS: 1.0000 mg | ORAL_TABLET | Freq: Two times a day (BID) | ORAL | Status: DC

## 2010-05-17 MED ORDER — CITALOPRAM HYDROBROMIDE 20 MG PO TABS: 20.0000 mg | ORAL_TABLET | Freq: Two times a day (BID) | ORAL | Status: DC

## 2010-05-17 MED ORDER — LISINOPRIL-HYDROCHLOROTHIAZIDE 20-12.5 MG PO TABS: 1.0000 | ORAL_TABLET | Freq: Every day | ORAL | Status: DC

## 2010-05-17 MED ORDER — ALENDRONATE SODIUM 70 MG PO TABS: 70.0000 mg | ORAL_TABLET | ORAL | Status: DC

## 2010-05-17 NOTE — Telephone Encounter (Signed)
Refills in [facility] from pharmacy. [facility] aware and will take care of.

## 2010-05-17 NOTE — Telephone Encounter (Signed)
Please let me know which medications she is in need of.

## 2010-06-19 ENCOUNTER — Telehealth: Payer: Self-pay

## 2010-06-19 ENCOUNTER — Other Ambulatory Visit: Payer: Self-pay | Admitting: Gastroenterology

## 2010-06-19 MED ORDER — CITALOPRAM HYDROBROMIDE 20 MG PO TABS: 20.0000 mg | ORAL_TABLET | Freq: Two times a day (BID) | ORAL | Status: DC

## 2010-06-19 MED ORDER — LISINOPRIL-HYDROCHLOROTHIAZIDE 20-12.5 MG PO TABS: 1.0000 | ORAL_TABLET | Freq: Every day | ORAL | Status: AC

## 2010-06-19 MED ORDER — ALPRAZOLAM 1 MG PO TABS: 1.0000 mg | ORAL_TABLET | Freq: Two times a day (BID) | ORAL | Status: DC

## 2010-06-19 MED ORDER — ALENDRONATE SODIUM 70 MG PO TABS: 70.0000 mg | ORAL_TABLET | ORAL | Status: DC

## 2010-06-19 NOTE — Telephone Encounter (Signed)
Please see separate note.

## 2010-06-19 NOTE — Telephone Encounter (Signed)
Pt called and stated she just got her medicaid card changed to Dr. Marisa Hua name and should have an appt soon. And is requesting that [facility] refill her meds "just one more time".  I called Dr. Marisa Hua office to see what was going on and they stated they have never heard from pt, that they do not have any paperwork either in their new pt file or their denied new pt file. They do not have any records on this pt at all. Please advise.

## 2010-06-19 NOTE — Telephone Encounter (Signed)
Please inform pt that she needs to get Rx for the above 4 meds from PCP/Dr Melina Copa. Thanks

## 2010-06-19 NOTE — Telephone Encounter (Signed)
Please let patient know that this is the last time. She needs to contact Dr. Carmie End office and clarify what is going on, sounds like there is no upcoming appt.

## 2010-06-19 NOTE — Telephone Encounter (Signed)
Pt aware- stated she got the paperwork and filled it out but did not turn it in. Said she was waiting for her updated medicaid card. Informed her she would not get anymore refills from [place] and that she needed to find a pcp soon.

## 2010-06-20 ENCOUNTER — Other Ambulatory Visit: Payer: Self-pay | Admitting: Gastroenterology

## 2010-06-21 ENCOUNTER — Other Ambulatory Visit: Payer: Self-pay | Admitting: Gastroenterology

## 2010-06-21 NOTE — Telephone Encounter (Signed)
Looks like this is a duplicate. [facility] already printed RX right?

## 2010-06-21 NOTE — Telephone Encounter (Signed)
Called [facility] they did not get rx electronic. Gave them refill that [facility] did and informed them that we will not be refilling any more of these meds for pt because she has to find a PCP.

## 2010-06-21 NOTE — Telephone Encounter (Signed)
It should have went electronic

## 2010-06-26 NOTE — Assessment & Plan Note (Signed)
NAME:  NORVELL, URESTE                  CHART#:  [id number]   DATE:                                   DOB:  [birth year]   Ms. Groft has occasional diarrhea and episodes of right lower quadrant  abdominal pain, perhaps three weekly, as she reports.  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  She has been on Imuran which we now know had her less than  therapeutic base with a 6TGN at 113.  However, it has been difficult to  contemplate increasing the Imuran, given ongoing Staphylococcus aureus  back abscess.  She is also on Pentasa 500 mg two tablets q.i.d.  She has  not had CBC or sedimentation rate recently.  Reflux symptoms are well-  controlled on Prilosec 20 mg orally daily.   CURRENT MEDICATIONS:  See updated list.   ALLERGIES:  CODEINE.   PHYSICAL EXAMINATION:  She appears for baseline.  Weight stable at 191, height 5 feet 10 inches, temperature 98.3, blood  pressure 130/82, pulse 80.  SKIN:  Warm and dry.  HEENT:  There is scleral icterus.  CHEST:  Lungs are clear to auscultation.  CARDIOVASCULAR:  Regular rate and rhythm without murmur, gallop, or rub.  ABDOMEN:  Flat.  Positive bowel sounds.  Soft.  She has right lower  quadrant tenderness to deep palpation.  No appreciable mass,  organomegaly, or hepatosplenomegaly.  EXTREMITIES:  No edema.   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  Subtherapeutic Imuran level in the setting of  Staphylococcus back abscess.  At this point it would really be good to  get some tertiary guidance in terms of which way to go with Imuran  versus another agent in the setting of a significant preexisting  infection.  I do feel that if we bumped her Imuran up into the  therapeutic range we would get a better result from a standpoint of her  Crohn's disease, but there are risks of producing what may be a  clinically significant immunosuppression.  To  the end I have taken the  liberty of recommending to Ms. Wojnarowski she see Dr. Rickard Rhymes over  at [facility] the first of the year at least in consultation  to get his input.  At this point we will not change her medical regimen  in any way.  Will check a CBC with differential and a sedimentation rate today and  make further recommendations in the very near future.       Bridgette Habermann, M.D.  Electronically Signed     RMR/MEDQ  D:  02/03/2007  T:  02/03/2007  Job:  [id number]   cc:   [name], MD

## 2010-06-26 NOTE — Assessment & Plan Note (Signed)
NAME:  Sheri [name], Sheri [name]                  CHART#:  [id number]   DATE:  12/01/2006                       DOB:  23-Sep-1950   REASON FOR VISIT:  Followup active ileocolonic Crohn's disease.   HISTORY OF PRESENT ILLNESS:  Ms. Werden returns. She has seen Dr. Jenny Reichmann  ________, her new ID physician down in [place] for staphylococcal  spine abscess. Apparently, I do have communication from him. It is felt  the abscess is likely under control and on a road to resolution but he  did recommend another 3 months worth of Keflex and okayed me to adjust  upward her dose of Imuran. Recent ileo-colonoscopy by me demonstrated  endoscopic and histologic active disease. There is no evidence of CMV.  She has not been taking any non-steroidal agents. She tells me that  without any change in her Crohn's regimen, her bowel dysfunction and  abdominal pain have actually improved. She is having less right lower  quadrant abdominal pain, having 4 to 7 non-bloody bowel movements daily.  She had been on a probiotic (Florajen) for a month but was not able to  afford it after that and has been off of it approximately a month and  feels that her symptoms are somewhat worsening. She does have some back  pain from time to time, sometimes related to bowel movements and others  not. Her Crohn's regimen at this time includes Imuran 75 mg daily and  Pentasa 1 gram q.i.d. We did do Prom[employer] TPMT enzyme activity and  enzyme level is normal at 34.9 EU. In addition, her 6 TGN level came  back at 113 with a normal 230 to 400, implying a lower likelihood of  response with this level of metabolite. She has not been on Anticort or  prednisone. She has never been given Entocort per her report. She feels  probiotic therapy helps her as much as anything did recently.   CURRENT MEDICATIONS:  See updated list. Reflux symptoms are well  controlled on Prilosec 20 mg daily.   ALLERGIES:  CODEINE.   PHYSICAL EXAMINATION:  GENERAL:  She  appears well today.  VITAL SIGNS:  Weight is 190. It was 194 back on August 21, 2006.  CHEST:  Lungs are clear to auscultation.  CARDIAC:  Regular rate and rhythm without murmur, rub, or gallop.  ABDOMEN:  Flat. Positive bowel sounds. Soft. She has very minimal right  lower quadrant tenderness to deep palpation. I do not appreciate a mass  or other organomegaly.  EXTREMITIES:  No edema.  BACK:  There is no percussion to the lower spine. Has a well healed  surgical scar, lumbar spine area.   IMPRESSION:  Active ileocolonic Crohn's disease, clinically more  improved over the past couple of months. Her 6 TG level is below the  therapeutic range. She has normal TPMT enzyme activity. I feel  clinically, her Crohn's disease is stable right now, although she may  not truly be in a good remission. Concern that I have at this point, is  she has been on Imuran since before coming to see me back in 2005 and  would have to be concerned that even though her 6 TG levels are low,  Imuran may be producing some degree of immunosuppression, although Dr.  Megan Salon has okayed it for me to bump  up her Imuran but I am somewhat  hesitant to do so, as he has still recommended continued antibiotic  treatment for another 3 months in dealing with her spine abscess. Latest  information coming out on Pentasa with primary ileal Crohn's disease is  that it does not appear to be particularly efficacious as well, although  Ms. Daniele strongly feels that this agent has helped her over the years.  At this point, I told Ms. Gheen that I would not increase her Imuran, as  I feel the risk of immunosuppression outweighs the risk. Alternatively,  I told her that we could consider stopping the Imuran and starting some  Entocort. Even though this is a corticosteroid, it has a significant  first pass effect and may well have very limited systemic  immunosuppressive properties. I do think it would be a good idea to get  her back on  Probiotic and will give her some samples from the office. I  will seek out a telephone consultation with one of our local experts at  the tertiary center, to get another opinion as to which way to go with  this nice lady. But overall, as she seems to be doing fairly well at  this time, I think we have some decision making time. Will plan to see  this nice lady back in 6 weeks.       Bridgette Habermann, M.D.  Electronically Signed     RMR/MEDQ  D:  12/01/2006  T:  12/01/2006  Job:  [id number]   cc:   [name], MD  Michel Bickers, M.D.

## 2010-06-26 NOTE — Op Note (Signed)
NAME:  Sheri [name], Sheri [name]                 ACCOUNT NO.:  [id number]   MEDICAL RECORD NO.:  [id number]          PATIENT TYPE:  AMB   LOCATION:  DAY                           FACILITY:  [facility]   PHYSICIAN:  Sheri [name], M.D. DATE OF BIRTH:  1950-06-06   DATE OF PROCEDURE:  09/11/2006  DATE OF DISCHARGE:                               OPERATIVE REPORT   PROCEDURE:  Colonoscopy with biopsy and stool sampling.   INDICATIONS FOR PROCEDURE:  The patient is a 60 year old lady with  ileocolonic Crohn's disease status post resection x2 previously with  worsening diarrhea and blood per rectum recently.  She has not been  taking any non-steroidal agents.  Initial stool studies came back  negative through our office.  She is on Imuran and Pentasa.  She is also  on antibiotics for a spinal abscess followed by Dr. Dellia [name] down in  [place].  Colonoscopy is now being done to further evaluate blood per  rectum and the increased diarrhea.  This approach has been discussed  with the patient at length.  The potential risks, benefits, and  alternatives have been reviewed, and questions answered.  Please see  documentation in the medical record.   DESCRIPTION OF PROCEDURE:  O2 saturation, blood pressure, and pulse rate  were monitored throughout the entire procedure.  Conscious sedation with  Versed 4 mg IV and Demerol 100 mg IV in divided doses.   INSTRUMENT USED:  Pentax video chip system.   FINDINGS:  Digital rectal examination revealed no abnormalities.  Endoscopic findings:  The prep was adequate.  Colon:  The colonic mucosa was surveyed from the rectosigmoid colon  through the left, transverse, right colon, to the area of the  anastomosis with the small bowel.  Examination of the anastomosis to the  small bowel was undertaken.  The patient had a couple of areas of what  appeared to be suture granulomas.  One was just at the orifice with the  anastomosis to small bowel.  There were some ulcerations  and a couple of  scattered tiny ulcerations on the opposite wall of the colon from the  anastomosis.  There was a polypoid area just at the mouth of the small  bowel opening where I did not see any suture protruding, it was somewhat  hard to palpation but likely a suture granuloma, please see photos.  The  aperture to the small bowel is approximately 3-4 mm and would not admit  the scope.  This area was biopsied for histologic study and the polypoid  area adjacent to it was biopsied separately.  From this level, the scope  was slowly withdrawn.  All previously mentioned mucosal surfaces were  again seen.  The patient had some scattered sigmoid diverticula;  however, the colonic mucosa all the way to the anastomosis appeared  normal.  The scope was pulled down in the rectum where a thorough  examination of the rectal mucosa including a retroflex view of the anal  verge demonstrated a 1.5 cm sessile pale appearing polyp just inside the  anal verge (no more than 3 cm)  that had what appeared to be some mucosal  retraction, although the surface had more features of a hyperplastic  appearing pale polyp than an adenoma, please see multiple photographs  taken.  There was also a single prominent external hemorrhoidal tag.  This lesion was engaged with the snare and completely resected and when  past the snare cautery, it was recovered.  The patient tolerated the  procedure well and was reacted in endoscopy.   IMPRESSION:  1.5 cm distal rectal polyp resected as described above.  Prominent external hemorrhoid tag (1).  Otherwise, normal rectum.  Left  sided diverticula.  Status post right hemicolectomy with ulceration and  stricturing of the neoterminal ileum consistent with active Crohn's  disease.  Polypoid area adjacent to this likely representing a suture  granuloma, biopsied separately.   Although the patient had a respectable polyp in the distal rectum, I  suspect the bright red blood per  rectum has come more from hemorrhoids  in the setting of worsening diarrhea.  She has active ileocolonic  Crohn's disease endoscopically.  Will follow up on biopsies.   Her normal intestinal flora has likely been disrupted with ongoing  antibiotic therapy for her spinal abscess.  Will double check her C.  diff status.  I am going to go ahead and give her some Anusol  suppositories for hemorrhoids and diverticulosis literature.   I will also add a Probiotic in the way of Alinia 1 capsule daily.   At some point, she ought to have imaging of her residual small bowel to  assess the degree of stricturing upstream.   Also consider doing a TNPT phenotype assay to see where we stand in  terms of azathioprine levels.   Further recommendations to follow.      Sheri [name], M.D.  Electronically Signed     RMR/MEDQ  D:  09/11/2006  T:  09/11/2006  Job:  [id number]   cc:   Sheri Cong, MD   Sheri [name], M.D.

## 2010-06-26 NOTE — Assessment & Plan Note (Signed)
NAME:  Sheri [name], Sheri [name]                  CHART#:  [id number]   DATE:  08/21/2006                       DOB:  08-Nov-1950   CHIEF COMPLAINT:  Rectal bleeding.   SUBJECTIVE:  The patient is a 60 year old female with history of  ileocolonic Crohn's disease.  She tells me approximately 2 weeks ago she  had a 4 to 5 day history of loose watery stools with bright red blood.  She was having about 8 to 10 stools a day.  She was having bilateral  lower quadrant abdominal pain, right greater than left.  She rates the  pain 3 out of 10 on pain scale currently.  The pain was at worse 6 out  of 10.  She has not seen any further blood in her stools.  She is on  Imuran 75 mg daily and potassium 1 gram q.i.d.  She is taking Percocet  for chronic pain.  She has been on Keflex with a history of spinal  abscess since February 2007.  She has history of osteopenia on recent  bone density test.   CURRENT MEDICATIONS:  See the list from August 21, 2006.   ALLERGIES:  CODEINE.   OBJECTIVE:  VITAL SIGNS:  Weight 194 pounds.  Height 69 inches.  Temp  98.1.  Blood pressure 132/88 and pulse 88.  GENERAL:  She is a well-developed, well-nourished Caucasian female in no  acute distress.  HEENT:  Sclera were clear, conjunctivae pink.  Oropharynx pink and moist  without any lesions.  NECK:  Supple without any masses or thyromegaly.  CHEST:  Heart regular rate and rhythm.  Normal S1, S2 without any  murmurs, rubs or gallops.  LUNGS:  Clear to auscultation bilaterally.  ABDOMEN:  Positive bowel sounds x4.  No bruits auscultated.  Soft,  nontender, nondistended without palpable masses or hepatosplenomegaly.  No rebound, tenderness or guarding.  EXTREMITIES:  Without clubbing or edema bilaterally.  SKIN:  Pink, warm and dry without any rash or jaundice.   ASSESSMENT:  The patient is a 60 year old female with history of  ileocolonic Crohn's disease.  She had a 4 to 5 day history of bloody  diarrhea.  Symptoms have  slacked off.  She is still having 2 to 5 bowel  movements a day; however, no blood in her stools.  She is on chronic  antibiotic therapy and therefore is at risk for C. diff colitis or  pseudomembranous colitis.  Recent lab work from Dr. Ruffin Frederick office  shows a mild anemia with a hemoglobin of 11.3, a sed rate of 41, and a  total bilirubin of 1.3, otherwise normal labs.  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  Her continued abdominal pain could be related to  pseudomembranous colitis, Crohn's flare and biliary etiology should be  ruled out as well given hyperbilirubinemia, although her symptoms are  definitely atypical, and this does not explain her rectal bleeding.   PLAN:  1. We will recheck LFTs today.  2. A full set of stool studies.  3. Pentasa 1 gram q.i.d.  4. Continue Imuran 75 mg daily.  5. If stool studies are negative and diarrhea persists, she is going      to need further evaluation with colonoscopy by Dr. Gala Romney.  6. If you will note, she is requesting refill on her  Percocet.  She      tells me she has a refill at Dr. Ruffin Frederick office in [place]      waiting; however, she does not want to have to drive to [facility]      to pick this up and is requesting I refill this today.  I have      asked her to follow up with Dr. Quentin Cornwall, as I would prefer she get      her chronic pain medications from one physician.       Vickey Huger, N.P.  Electronically Signed     R. Garfield Cornea, M.D.  Electronically Signed    KJ/MEDQ  D:  08/21/2006  T:  08/22/2006  Job:  [id number]   cc:   [name], MD  Arelia Longest. Michelle Nasuti., M.D.

## 2010-06-26 NOTE — Assessment & Plan Note (Signed)
NAME:  Sheri [name], COACHMAN                  CHART#:  [id number]   DATE:  05/15/2007                       DOB:  1950/05/11   FOLLOWUP:  Small bowel Crohn's disease now on Imuran 100 mg orally  daily.  This was bumped up to 75.  Saw Dr. Koleen Distance.  TMT enzyme  activity was normal at 34.9 e.u.  A 6-thioguanine level was recently  checked and found to be 113 with normals 203 to 400.  Dr. 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  She is having four to six bowel movements  daily, which she considers to be very good.  She is rarely having any  abdominal pain, no rectal bleeding, no nausea or vomiting.  Reflux  symptoms well controlled on Prilosec 20 mg early daily.  Overall she is  feeling better.  She has some small tiny little skin lesions that she is  concerned about and is using  a tanning bed for her fibromyalgia.  She  saw Dr. Rowe Pavy recently and he did some blood work.  We do not have those  for review.  He started lisinopril for hypertension.   CURRENT MEDICATIONS:  See updated list.  She also,in addition to the  above agents, takes Pentasa 1 gram q.i.d.   ALLERGIES:  CODEINE.   EXAMINATION:  She is tan.  She appears in no acute distress.  Weight  192.  Height 5 feet 9 inches.  Temperature 98.4.  Blood pressure 102/66,  pulse 88.  SKIN:  Warm and dry.  Tanned.  Pustular lesions in a couple of places on  her arms.  ABDOMEN:  With no rebound, tenderness or guarding.  Nondistended.  Positive bowel sounds.  Entirely nontender without appreciable mass,  splenomegaly.   ASSESSMENT:  Small bowel Crohn's disease now on an increasing dose of  Imuran.  Not mentioned above, Dr. Megan Salon, infectious disease, stopped  her antibiotics and her spinal abscess felt to be resolved.  We will  retrieve Dr. Inda Castle  labs to see what her CBC looks like and we will  plan to bump up her Imuran in step wise fashion, and make further  recommendations in the very near future.  As far as the little pustular  lesions on her skin are concerned, I recommended that she see a  dermatologist.      Bridgette Habermann, M.D.  Electronically Signed    RMR/MEDQ  D:  05/15/2007  T:  05/15/2007  Job:  [id number]   cc:   [name], MD

## 2010-06-29 NOTE — Discharge Summary (Signed)
NAME:  Sheri [name], Sheri [name] NO.:  [id number]   MEDICAL RECORD NO.:  [id number]          PATIENT TYPE:  INP   LOCATION:  5409                         FACILITY:  [facility]   PHYSICIAN:  Eustace Moore, MD     DATE OF BIRTH:  [birth year]   DATE OF ADMISSION:  04/08/2005  DATE OF DISCHARGE:  04/16/2005                                 DISCHARGE SUMMARY   ADMITTING DIAGNOSIS:  Lumbar epidural abscess.   HISTORY OF PRESENT ILLNESS:  Ms. Carta is a 60 year old female with a  history of Crohn's disease who presented to the emergency department at  [facility] on April 08, 2005, complaining of severe back  pain with right leg pain. She denied any significant numbness, tingling or  weakness in her legs but her pain was getting significantly worse. She had  been to the emergency department twice in previous days with similar  symptoms. She had MRI done with contrast which showed epidural abscess at L4-  L5 and L5-S1.  She was transferred for neurosurgical evaluation. She was  brought to the holding room expecting to undergo an operative procedure for  epidural abscess. She had been placed on Cipro for a urinary tract infection  over the previous week and mentioned a history of a respiratory tract  infection a few weeks before admission which was treated with antibiotics.  She denied any bowel or bladder changes or significant weakness, numbness or  tingling. On exam, she seemed to have good strength and fairly normal  reflexes to an in bed exam. The MRI showed a significant epidural abscess L4-  L5 and L5-S1 on the right and she was consented for urgent operative  intervention.   HOSPITAL COURSE:  The patient was taken to the operating room urgently for  evacuation of epidural abscess L4-L5 and L5-S1 on the right. For details of  the operative procedure, please see the dictated operative note. The  patient's hospital course after that was fairly routine without  significant  complication. She was placed on a PCA for pain control immediately  postoperatively which she kept for two days. She was afebrile  postoperatively and had good strength in her lower extremities. Her incision  was clean, dry, intact. Physical and occupational therapy were ordered. She  was placed on vancomycin and Fortaz empirically until cultures returned.  Infectious disease was consulted on postop day one.  They discontinued the  Fortaz at that time and continued the vancomycin pending cultures.  She  continued along this course.  She was transferred to the neurosurgical floor  on April 10, 2005, where she remained afebrile. Her incision remained  clean, dry, intact. Her Hemovac was removed on postop day two.  She had no  nuchal rigidity. She continued on her vancomycin. Her PCA was discontinued  on that day. Her culture showed Staph aureus. Sensitivities were pending. I  felt she was making the expected progress with PT and OT. She did have  significant pain but was well-controlled on oral pain medications. Her  vancomycin was discontinued on April 11, 2005,  by  infectious disease and she  was started on Ancef as she grew methicillin-sensitive Staph aureus. She  continued on this course. She remained afebrile with stable vital signs with  good dorsi and plantar flexion. She began ambulating in the halls without  difficulty.  She did complain of some continued pain which needed to be  treated but she continued to make progress and was discharged home in stable  condition on April 16, 2005.   DISCHARGE MEDICATIONS:  Ancef 1 gram IV q.8 hours per infectious disease  recommendations. She was also placed on Percocet 1-2 p.o. q.4-6h. p.r.n.  pain.   FOLLOW-UP:  Two weeks with Dr. Ronnald Ramp.   FINAL DIAGNOSIS:  Status post lumbar laminectomy for epidural abscess.      Eustace Moore, MD  Electronically Signed     DSJ/MEDQ  D:  05/31/2005  T:  06/01/2005  Job:  [id number]

## 2010-06-29 NOTE — Assessment & Plan Note (Signed)
NAME:  Sheri [name], Sheri [name]                  CHART#:  [id number]   DATE:  10/14/2006                       DOB:  08/21/1950   Follow-up of ileocolonic Crohn's disease.  Imuran levels came back.  Her  6 thioguanine level is 113 with normal range 230 to 400 on 75 mg daily.  Her TPMT enzyme activity is 34.9 international units which is in the  normal range.  I spoke to Dr. Megan Salon, [place] down in [place].  She  previously saw Dr. Everlene Balls but he is retiring, about the need for  her to have her dose of Imuran bumped up to get a better clinical  response, however she is known to have a spinal abscess and in between  her follow-up appointments in the ID clinic down there.  She continues  to take Keflex.  Dr. Megan Salon said he would be sure to get her in, in  the very near future and go everything and get back with me in regards  to recommendations about going up on the Imuran.  There is certainly a  risk of getting her Crohn's under better control but this could  certainly undermine our efforts at getting the abscess resolved.       Bridgette Habermann, M.D.  Electronically Signed     RMR/MEDQ  D:  10/14/2006  T:  10/14/2006  Job:  [id number]   cc:   [name], MD

## 2010-06-29 NOTE — Op Note (Signed)
NAME:  Sheri [name], Sheri [name] NO.:  [id number]   MEDICAL RECORD NO.:  [id number]          PATIENT TYPE:  INP   LOCATION:  2314                         FACILITY:  [facility]   PHYSICIAN:  Eustace Moore, MD     DATE OF BIRTH:  [birth year]   DATE OF PROCEDURE:  04/08/2005  DATE OF DISCHARGE:                                 OPERATIVE REPORT   PREOPERATIVE DIAGNOSIS:  Lumbar epidural abscess L4-5, L5-S1 on the right.   POSTOPERATIVE DIAGNOSIS:  Lumbar epidural abscess L4-5, L5-S1 on the right.   PROCEDURE:  Lumbar decompressive hemilaminectomy, medial facetectomy,  foraminotomies L4-5, L5-S1 on the right side for evacuation of spinal  epidural abscess and phlegmon.   SURGEON:  Eustace Moore, M.D.   ANESTHESIA:  General endotracheal.   COMPLICATIONS:  None apparent.   INDICATIONS FOR PROCEDURE:  Sheri [name] is a 60 year old white female with a  history of Crohn's disease who presented [facility]  with several-day history of significant back pain with right leg pain. She  had an MRI with contrast which showed an epidural enhancing mass at L4-5 and  L5-S1 on the right side consistent with epidural abscess. She was  transferred to [facility] for neurosurgical evaluation.  Upon arrival, seemed to have good strength in her lower extremities with  good muscle tone and bulk, but was in significant pain especially with any  movement.  Her MRI indeed showed an enhancing epidural mass at L4-5 and L5-  S1 on the right side with significant compression of the thecal sac and the  exiting nerve roots.  I  recommended a lumbar exploration with evacuation of  the epidural abscess. She understood the risks, benefits and expected  outcome and wished to proceed.   DESCRIPTION OF PROCEDURE:  The patient was taken to operating room where  after induction of adequate generalized endotracheal anesthesia, she was  rolled into the prone position on the  Wilson frame. All pressure points were  padded. Her lumbar region was prepped with DuraPrep and draped in the usual  sterile fashion.  We injected 10 mL of local anesthesia and then a dorsal  midline incision was made and carried down to the lumbosacral fascia. The  fascia on the patient's right side was opened taken down in the  subperiosteal fashion to expose L4-5 and L5-S1 on the right. Intraoperative  x-ray confirmed our level. The high-speed drill and Kerrison punch was then  used to perform a hemilaminectomy, medial facetectomy and foraminotomy at L4-  5 and L5-S1 on the right side.  The yellow ligament was removed. I was able  to find some liquid pus adjacent to the L5 pedicle. This was removed, it was  also cultured and sent for Gram stain and culture.  We then gave vancomycin  and Fortaz after the culture was taken.  The patient had been treated with  Cipro prior to surgery for presumed UTI.  I then was able to dissect down  and identify the L5 pedicle and a S1 pedicle. However, I still saw what  appeared to be a large phlegmon on the outside of the dura and did not find  what I felt was dura.  Therefore I stuck a nerve hook in this was able to  open this was able to get into the phlegmon, identify the back of the L5  body, identify the L5-S1 the interspace, was able to perform a diskectomy at  L5-S1 on the right side, removing what appeared to be abnormal disk.  Epidural phlegmon was removed from the axilla of the L5 nerve root and then  I was able to identify the underside of the L5 nerve root. I continued to  decompress into the lateral recesses to assure adequate decompression of the  thecal sac.  I also performed a sublaminar decompression to try to  decompress the central canal.  Once this was completed, I inspected the  nerve roots once again as best I could through the phlegmon.  I then  irrigated with saline solution containing bacitracin and then placed a  medium Hemovac  drain through a separate stab incision, lined the dura with  Gelfoam, dried all bleeding points and closed the fascia with interrupted #1  Vicryl, closing subcutaneous and subcuticular tissues with 2-0 and 3-0  Vicryl and closed skin with Benzoin and Steri-Strips. The drapes removed.  Sterile dressing was applied. The patient was awakened from general  anesthesia and transferred to the recovery room in stable condition.  At the  end of the procedure all sponge, needle and instrument counts were correct.      Eustace Moore, MD  Electronically Signed     DSJ/MEDQ  D:  04/08/2005  T:  04/09/2005  Job:  [id number]

## 2010-06-29 NOTE — Op Note (Signed)
NAME:  Sheri [name], Sheri [name] NO.:  [id number]   MEDICAL RECORD NO.:  [id number]          PATIENT TYPE:  INP   LOCATION:  NA                           FACILITY:  [facility]   PHYSICIAN:  Eustace Moore, MD     DATE OF BIRTH:  1951-02-06   DATE OF PROCEDURE:  DATE OF DISCHARGE:                                 OPERATIVE REPORT   CHIEF COMPLAINT:  Lumbar epidural abscess.   HISTORY OF PRESENT ILLNESS:  Ms. Sheri [name] is a 60 year old female with a  history of Crohn's disease who presented to the emergency department at  [facility] earlier today complaining of progressively  worsening low back pain that radiated into the right buttock and leg.  She  denied any significant numbness, tingling, or significant weakness but her  pain was getting significantly worse.  She had been to the emergency  department twice in the last few days with similar symptoms.  She had an MRI  done with contrast that showed epidural abscess at L4-5, L5-S1 and  neurosurgical consultation was recommended.  She was transferred to [facility] for neurosurgical evaluation.  She was brought to the holding  room expecting to have to go to the operating room emergently for epidural  abscess removal.  She had been placed on Cipro for a urinary tract infection  last week and she mentioned some history of a respiratory tract infection in  the last few weeks which was treated with antibiotics.  She is on Vicodin  for pain.  She denies any bowel and bladder changes or significant weakness  or numbness or tingling.   PAST MEDICAL HISTORY:  1.  Crohn's disease.  2.  Fibromyalgia.  3.  Nephrolithiasis.   MEDICATIONS:  1.  Pentasa.  2.  Imuran.  3.  Prilosec.  4.  Celexa.  5.  Vicodin.   ALLERGIES:  CODEINE.   SOCIAL HISTORY:  She denies use of tobacco products.   REVIEW OF SYSTEMS:  She has had some recent fevers and she has had some  myalgias.  No chest pain.  No difficulty  breathing.   PHYSICAL EXAMINATION:  VITAL SIGNS:  Temperature 101.8, respirations 16.  GENERAL:  Patient appears to be in some discomfort, but not in severe pain.  She is lying flat in a stretcher.  HEENT:  Normocephalic, atraumatic.  Extraocular movements are intact.  Her  lips are quite dry and chapped.  NECK:  Supple.  HEART:  Regular rate and rhythm.  EXTREMITIES:  No clubbing, cyanosis, edema.  NEUROLOGIC:  She is awake and alert.  She is oriented x4.  No aphasia.  Her  attention span is good.  She has no facial asymmetry.  Her tongue protrudes  in midline.  She has good shoulder shrug.  Her gait is not tested but she  seems to have good strength to an in bed examination in both lower  extremities with fairly normal reflexes.  She does have positive straight  leg raise test on the right side.  Sensation is grossly intact throughout.   IMAGING  STUDIES:  MRI of the lumbar spine I have reviewed as well as its  report.  I have also gone over the medical records from the outside  hospital.  The MRI shows some signal change within the posterior vertebral  bodies of L4 and L5.  There is then epidural enhancing lesion extending into  the L4-5 and L5-S1 foramina on the right side and causing some compression  of the thecal sac and right-sided lateral recess stenosis.  The disks  actually look okay.  I do not see any significant enhancement of the disks  to suggest diskitis.  There is some change within the joints at L4-5 and L5-  S1 on the right side.   LABORATORY DATA:  (From the outside hospital)  She is hypokalemic and  hyponatremic.  Her urinalysis actually looks fairly good without signs of  significant infection.  Her white count is normal.   ASSESSMENT/PLAN:  This is a 60 year old female with a history of Crohn's  disease who now has what looks like epidural abscess at L4-5 and L5-S1 on  the right side causing some canal stenosis.  The source of this infection is  unclear at this  point through there is a history of an upper respiratory  tract infection and a recent urinary tract infection per the patient's  report.  She is on Cipro at this point.  She is going to need to be on  vancomycin, Fortaz, and Flagyl.  Will do intraoperative cultures.  Have  recommended an urgent lumbar laminectomy at L4-5 and L5-S1 on the right side  for evacuation of the epidural abscess and decompression of the nerve roots.  This will also give [place] a chance to culture the bacteria.  Will get  infectious disease involved and try to find a primary source for this  infection.  She will likely need six weeks of IV antibiotics followed by six  weeks of oral antibiotics.      Eustace Moore, MD  Electronically Signed     DSJ/MEDQ  D:  04/08/2005  T:  04/09/2005  Job:  [id number]

## 2010-08-14 ENCOUNTER — Other Ambulatory Visit: Payer: Self-pay

## 2010-08-14 MED ORDER — AZATHIOPRINE 50 MG PO TABS: ORAL_TABLET | ORAL | Status: DC

## 2010-08-21 ENCOUNTER — Other Ambulatory Visit: Payer: Self-pay

## 2010-08-22 NOTE — Telephone Encounter (Signed)
Please call pt.  She needs OV prior to RFs.

## 2010-08-23 NOTE — Telephone Encounter (Signed)
PT informed. She will call to schedule. She is with daughter in [place] office now.

## 2010-08-27 NOTE — Telephone Encounter (Signed)
PT HAD AN APPT SCHEDULED W/LSL FOR 7/17 BUT CALLED /7/16 AND CANCELLED

## 2010-08-28 ENCOUNTER — Ambulatory Visit: Payer: Medicare Other | Admitting: Gastroenterology

## 2010-08-28 ENCOUNTER — Telehealth: Payer: Self-pay | Admitting: Gastroenterology

## 2010-08-28 NOTE — Telephone Encounter (Signed)
Patient needs f/u with RMR only regarding Crohn's and to determine when next TCS needs to be. Make appt for9/2012.

## 2010-08-28 NOTE — Telephone Encounter (Signed)
Called patient and left message.

## 2010-08-28 NOTE — Telephone Encounter (Signed)
Made patient an appointment for 10-26-10 to see Dr.Rourk

## 2010-09-03 ENCOUNTER — Encounter: Payer: Self-pay | Admitting: Internal Medicine

## 2010-09-12 NOTE — Telephone Encounter (Signed)
Noted  

## 2010-10-22 ENCOUNTER — Encounter: Payer: Self-pay | Admitting: Internal Medicine

## 2010-10-22 ENCOUNTER — Other Ambulatory Visit: Payer: Self-pay | Admitting: Gastroenterology

## 2010-10-22 ENCOUNTER — Ambulatory Visit (INDEPENDENT_AMBULATORY_CARE_PROVIDER_SITE_OTHER): Payer: Medicare Other | Admitting: Internal Medicine

## 2010-10-22 VITALS — BP 116/77 | HR 102 | Temp 98.1°F | Ht 70.0 in | Wt 203.2 lb

## 2010-10-22 DIAGNOSIS — K50813 Crohn's disease of both small and large intestine with fistula: Secondary | ICD-10-CM

## 2010-10-22 DIAGNOSIS — K5080 Crohn's disease of both small and large intestine without complications: Secondary | ICD-10-CM | POA: Insufficient documentation

## 2010-10-22 DIAGNOSIS — K625 Hemorrhage of anus and rectum: Secondary | ICD-10-CM

## 2010-10-22 NOTE — Assessment & Plan Note (Addendum)
30 year history of ileocolonic Crohn's disease. Last colonoscopy 2008. Intermittent rectal bleeding likely secondary to a benign anorectal source. However, with the longevity of her disease, interval surveillance/diagnostic colonoscopy now. I reviewed this approach with the patient at some length. The risks, benefits, limitations, alternatives and imponderables have been reviewed with the patient. Questions have been answered. All parties are agreeable.  Colonoscopy will be set up at the hospital in the near future.  She is also due to have a hepatic function profile and a CBC at this time.

## 2010-10-22 NOTE — Progress Notes (Signed)
Primary Care Physician:  Michel Bickers, MD, MD Primary Gastroenterologist:  Dr.   Pre-Procedure History & Physical: HPI:  Sheri [name] is a 60 y.o. female here for followup. History of ileocolonic Crohn's disease x30 years. Last colonoscopy 2008 she had benign polyps and active mucosal disease. She's been on Imuran and Pentasa. She's done fairly well; she has occasional diarrhea; intermittent rectal bleeding which has been felt to be duel to hemorrhoids.  Past Medical History  Diagnosis Date  . Crohn disease   . Anemia   . Low back pain   . Fibromyalgia     Past Surgical History  Procedure Date  . Small intestine surgery [id number]  . Cesarean section [id number]  . Appendectomy 1990  . Drainage of back abscess 2007  . Colonoscopy 09/11/2006  . Esophagogastroduodenoscopy 04/2009    Prior to Admission medications   Medication Sig Start Date End Date Taking? Authorizing Provider  alendronate (FOSAMAX) 70 MG tablet Take 1 tablet (70 mg total) by mouth once a week. Take with a full glass of water on an empty stomach. 06/19/10  Yes Neil Crouch, PA  ALPRAZolam Duanne Moron) 1 MG tablet Take 1 tablet (1 mg total) by mouth 2 (two) times daily. Take one tablet by mouth two times a day 06/19/10  Yes Neil Crouch, PA  azaTHIOprine [facility]) 50 MG tablet Take 2 by mouth daily 08/14/10  Yes Neil Crouch, PA  Calcium Carbonate-Vitamin D (CALCIUM 600+D PO) Take by mouth 2 (two) times daily.     Yes Historical Provider, MD  citalopram (CELEXA) 20 MG tablet Take 1 tablet (20 mg total) by mouth 2 (two) times daily. 06/19/10  Yes Neil Crouch, PA  Cyanocobalamin (VITAMIN B-12 IJ) Inject as directed every 30 (thirty) days.     Yes Historical Provider, MD  cyclobenzaprine (FLEXERIL) 10 MG tablet Take 10 mg by mouth as needed.    Yes Historical Provider, MD  Fish Oil OIL by Does not apply route daily.     Yes Historical Provider, MD  hydrocortisone (ANUSOL-HC) 25 MG suppository Place 25 mg rectally 2 (two) times daily as  needed.    Yes Historical Provider, MD  lisinopril-hydrochlorothiazide (PRINZIDE,ZESTORETIC) 20-12.5 MG per tablet Take 1 tablet by mouth daily. 06/19/10  Yes Neil Crouch, PA  Loratadine 10 MG CAPS Take by mouth daily.     Yes Historical Provider, MD  mesalamine (PENTASA) 500 MG CR capsule Take 500 mg by mouth 4 (four) times daily. TAKE 2 CAPSULES BY MOUTH FOUR TIMES A DAY    Yes Historical Provider, MD  multivitamin [facility]) per tablet Take 1 tablet by mouth daily.     Yes Historical Provider, MD  omeprazole (PRILOSEC) 20 MG capsule Take 20 mg by mouth daily.     Yes Historical Provider, MD  ondansetron (ZOFRAN) 4 MG tablet Take 4 mg by mouth. ONE BY MOUTH EVERY 4-6 HOURS AS NEEDED FOR NAUSEA AND VOMITING    Yes Historical Provider, MD  oxyCODONE-acetaminophen (PERCOCET) 10-325 MG per tablet Take 1 tablet by mouth. TAKE ONE OR TWO TABLETS BY MOUTH EVERY 6 HOURS AS NEEDED    Yes Historical Provider, MD  vitamin E 1000 UNIT capsule Take 1,000 Units by mouth daily.     Yes Historical Provider, MD  Zinc 50 MG TABS Take by mouth daily.     Yes Historical Provider, MD    Allergies as of 10/22/2010 - Review Complete 10/22/2010  Allergen Reaction Noted  . Codeine sulfate      No  family history on file.  History   Social History  . Marital Status: Legally Separated    Spouse Name: N/A    Number of Children: N/A  . Years of Education: N/A   Occupational History  . Not on file.   Social History Main Topics  . Smoking status: Never Smoker   . Smokeless tobacco: Not on file  . Alcohol Use: Yes     frozen drinks here and there  . Drug Use: No  . Sexually Active: Not on file   Other Topics Concern  . Not on file   Social History Narrative  . No narrative on file    Review of Systems: See HPI, otherwise negative ROS  Physical Exam: BP 116/77  Pulse 102  Temp(Src) 98.1 F (36.7 C) (Temporal)  Ht 5' 10"  (1.778 m)  Wt 203 lb 3.2 oz (92.171 kg)  BMI 29.16 kg/m2 General:    Alert,  Well-developed, well-nourished, pleasant and cooperative in NAD Head:  Normocephalic and atraumatic. Eyes:  Sclera clear, no icterus.   Conjunctiva pink. Ears:  Normal auditory acuity. Nose:  No deformity, discharge,  or lesions. Mouth:  No deformity or lesions, dentition normal. Neck:  Supple; no masses or thyromegaly. Lungs:  Clear throughout to auscultation.   No wheezes, crackles, or rhonchi. No acute distress. Heart:  Regular rate and rhythm; no murmurs, clicks, rubs,  or gallops. Abdomen:  Multiple surgical scars Soft, nontender and nondistended. No masses, hepatosplenomegaly or hernias noted. Normal bowel sounds, without guarding, and without rebound.   Msk:  Symmetrical without gross deformities. Normal posture. Pulses:  Normal pulses noted. Extremities:  Without clubbing or edema. Neurologic:  Alert and  oriented x4;  grossly normal neurologically. Skin:  Intact without significant lesions or rashes. Cervical Nodes:  No significant cervical adenopathy. Psych:  Alert and cooperative. Normal mood and affect.

## 2010-10-22 NOTE — Patient Instructions (Signed)
We will make an appointment for a colonoscopy today.  You're due to have a CBC and hepatic function profile

## 2010-10-23 LAB — CBC WITH DIFFERENTIAL/PLATELET
Eosinophils Absolute: 0.1 10*3/uL (ref 0.0–0.7)
Eosinophils Relative: 2 % (ref 0–5)
Hemoglobin: 12.7 g/dL (ref 12.0–15.0)
Lymphocytes Relative: 25 % (ref 12–46)
Lymphs Abs: 1.6 10*3/uL (ref 0.7–4.0)
MCH: 32.6 pg (ref 26.0–34.0)
MCV: 93.8 fL (ref 78.0–100.0)
Monocytes Relative: 9 % (ref 3–12)
RBC: 3.9 MIL/uL (ref 3.87–5.11)
WBC: 6.3 10*3/uL (ref 4.0–10.5)

## 2010-10-23 LAB — HEPATIC FUNCTION PANEL
Bilirubin, Direct: 0.6 mg/dL — ABNORMAL HIGH (ref 0.0–0.3)
Indirect Bilirubin: 1 mg/dL — ABNORMAL HIGH (ref 0.0–0.9)
Total Bilirubin: 1.6 mg/dL — ABNORMAL HIGH (ref 0.3–1.2)

## 2010-10-25 ENCOUNTER — Other Ambulatory Visit: Payer: Self-pay | Admitting: Internal Medicine

## 2010-10-25 DIAGNOSIS — R945 Abnormal results of liver function studies: Secondary | ICD-10-CM

## 2010-10-25 DIAGNOSIS — K5080 Crohn's disease of both small and large intestine without complications: Secondary | ICD-10-CM

## 2010-10-26 ENCOUNTER — Ambulatory Visit: Payer: Medicare Other | Admitting: Internal Medicine

## 2010-10-30 ENCOUNTER — Ambulatory Visit (HOSPITAL_COMMUNITY)
Admission: RE | Admit: 2010-10-30 | Discharge: 2010-10-30 | Disposition: A | Discharge status: Home / Self Care | Payer: Medicare Other | Source: Ambulatory Visit | Attending: Internal Medicine | Admitting: Internal Medicine

## 2010-10-30 ENCOUNTER — Encounter (HOSPITAL_COMMUNITY)
Admission: RE | Disposition: A | Discharge status: Home / Self Care | Payer: Self-pay | Source: Ambulatory Visit | Attending: Internal Medicine

## 2010-10-30 ENCOUNTER — Other Ambulatory Visit: Payer: Self-pay | Admitting: Internal Medicine

## 2010-10-30 DIAGNOSIS — K5730 Diverticulosis of large intestine without perforation or abscess without bleeding: Secondary | ICD-10-CM

## 2010-10-30 DIAGNOSIS — K644 Residual hemorrhoidal skin tags: Secondary | ICD-10-CM | POA: Insufficient documentation

## 2010-10-30 DIAGNOSIS — K921 Melena: Secondary | ICD-10-CM

## 2010-10-30 DIAGNOSIS — K5080 Crohn's disease of both small and large intestine without complications: Secondary | ICD-10-CM

## 2010-10-30 DIAGNOSIS — K625 Hemorrhage of anus and rectum: Secondary | ICD-10-CM | POA: Insufficient documentation

## 2010-10-30 DIAGNOSIS — K5289 Other specified noninfective gastroenteritis and colitis: Secondary | ICD-10-CM | POA: Insufficient documentation

## 2010-10-30 DIAGNOSIS — K648 Other hemorrhoids: Secondary | ICD-10-CM

## 2010-10-30 DIAGNOSIS — Z9049 Acquired absence of other specified parts of digestive tract: Secondary | ICD-10-CM | POA: Insufficient documentation

## 2010-10-30 HISTORY — PX: COLONOSCOPY: SHX5424

## 2010-10-30 LAB — MISCELLANEOUS TEST: Miscellaneous Test: 3200

## 2010-10-30 LAB — THIOPURINE METHYLTRANSFERASE (TPMT), RBC
6-MMPN Metaboilte: 843
6-TGN Metabolite: 282

## 2010-10-30 SURGERY — COLONOSCOPY
Anesthesia: Moderate Sedation

## 2010-10-30 MED ORDER — MIDAZOLAM HCL 5 MG/5ML IJ SOLN
INTRAMUSCULAR | Status: DC | PRN
  Administered 2010-10-30 (×4): 1 mg via INTRAVENOUS
  Administered 2010-10-30: 2 mg via INTRAVENOUS
  Administered 2010-10-30 (×2): 1 mg via INTRAVENOUS

## 2010-10-30 MED ORDER — MEPERIDINE HCL 100 MG/ML IJ SOLN
INTRAMUSCULAR | Status: DC | PRN
  Administered 2010-10-30 (×2): 25 mg
  Administered 2010-10-30: 50 mg

## 2010-10-30 MED ORDER — MEPERIDINE HCL 100 MG/ML IJ SOLN
INTRAMUSCULAR | Status: AC
  Filled 2010-10-30: qty 1

## 2010-10-30 MED ORDER — MIDAZOLAM HCL 5 MG/5ML IJ SOLN
INTRAMUSCULAR | Status: DC
Start: 2010-10-30 — End: 2010-10-30
  Filled 2010-10-30: qty 10

## 2010-10-30 NOTE — H&P (Signed)
[name], MD  10/22/2010  3:01 PM  Signed Primary Care Physician:  Michel Bickers, MD, MD Primary Gastroenterologist:  Dr.    Pre-Procedure History & Physical: HPI:  Sheri [name] is a 60 y.o. female here for followup. History of ileocolonic Crohn's disease x30 years. Last colonoscopy 2008 she had benign polyps and active mucosal disease. She's been on Imuran and Pentasa. She's done fairly well; she has occasional diarrhea; intermittent rectal bleeding which has been felt to be duel to hemorrhoids.    Past Medical History   Diagnosis  Date   .  Crohn disease     .  Anemia     .  Low back pain     .  Fibromyalgia         Past Surgical History   Procedure  Date   .  Small intestine surgery  [id number]   .  Cesarean section  [id number]   .  Appendectomy  1990   .  Drainage of back abscess  2007   .  Colonoscopy  09/11/2006   .  Esophagogastroduodenoscopy  04/2009       Prior to Admission medications    Medication  Sig  Start Date  End Date  Taking?  Authorizing Provider   alendronate (FOSAMAX) 70 MG tablet  Take 1 tablet (70 mg total) by mouth once a week. Take with a full glass of water on an empty stomach.  06/19/10    Yes  Neil Crouch, PA   ALPRAZolam Duanne Moron) 1 MG tablet  Take 1 tablet (1 mg total) by mouth 2 (two) times daily. Take one tablet by mouth two times a day  06/19/10    Yes  Neil Crouch, PA   azaTHIOprine [facility]) 50 MG tablet  Take 2 by mouth daily  08/14/10    Yes  Neil Crouch, PA   Calcium Carbonate-Vitamin D (CALCIUM 600+D PO)  Take by mouth 2 (two) times daily.        Yes  Historical Provider, MD   citalopram (CELEXA) 20 MG tablet  Take 1 tablet (20 mg total) by mouth 2 (two) times daily.  06/19/10    Yes  Neil Crouch, PA   Cyanocobalamin (VITAMIN B-12 IJ)  Inject as directed every 30 (thirty) days.        Yes  Historical Provider, MD   cyclobenzaprine (FLEXERIL) 10 MG tablet  Take 10 mg by mouth as needed.       Yes  Historical Provider, MD   Fish Oil OIL  by Does not  apply route daily.        Yes  Historical Provider, MD   hydrocortisone (ANUSOL-HC) 25 MG suppository  Place 25 mg rectally 2 (two) times daily as needed.       Yes  Historical Provider, MD   lisinopril-hydrochlorothiazide (PRINZIDE,ZESTORETIC) 20-12.5 MG per tablet  Take 1 tablet by mouth daily.  06/19/10    Yes  Neil Crouch, PA   Loratadine 10 MG CAPS  Take by mouth daily.        Yes  Historical Provider, MD   mesalamine (PENTASA) 500 MG CR capsule  Take 500 mg by mouth 4 (four) times daily. TAKE 2 CAPSULES BY MOUTH FOUR TIMES A DAY       Yes  Historical Provider, MD   multivitamin [facility]) per tablet  Take 1 tablet by mouth daily.        Yes  Historical Provider, MD   omeprazole (PRILOSEC) 20 MG  capsule  Take 20 mg by mouth daily.        Yes  Historical Provider, MD   ondansetron (ZOFRAN) 4 MG tablet  Take 4 mg by mouth. ONE BY MOUTH EVERY 4-6 HOURS AS NEEDED FOR NAUSEA AND VOMITING       Yes  Historical Provider, MD   oxyCODONE-acetaminophen (PERCOCET) 10-325 MG per tablet  Take 1 tablet by mouth. TAKE ONE OR TWO TABLETS BY MOUTH EVERY 6 HOURS AS NEEDED       Yes  Historical Provider, MD   vitamin E 1000 UNIT capsule  Take 1,000 Units by mouth daily.        Yes  Historical Provider, MD   Zinc 50 MG TABS  Take by mouth daily.        Yes  Historical Provider, MD         Allergies as of 10/22/2010 - Review Complete 10/22/2010   Allergen  Reaction  Noted   .  Codeine sulfate          No family history on file.    History       Social History   .  Marital Status:  Legally Separated       Spouse Name:  N/A       Number of Children:  N/A   .  Years of Education:  N/A       Occupational History   .  Not on file.       Social History Main Topics   .  Smoking status:  Never Smoker    .  Smokeless tobacco:  Not on file   .  Alcohol Use:  Yes         frozen drinks here and there   .  Drug Use:  No   .  Sexually Active:  Not on file       Other Topics  Concern   .  Not on file        Social History Narrative   .  No narrative on file      Review of Systems: See HPI, otherwise negative ROS   Physical Exam: BP 116/77  Pulse 102  Temp(Src) 98.1 F (36.7 C) (Temporal)  Ht 5' 10"  (1.778 m)  Wt 203 lb 3.2 oz (92.171 kg)  BMI 29.16 kg/m2 General:   Alert,  Well-developed, well-nourished, pleasant and cooperative in NAD Head:  Normocephalic and atraumatic. Eyes:  Sclera clear, no icterus.   Conjunctiva pink. Ears:  Normal auditory acuity. Nose:  No deformity, discharge,  or lesions. Mouth:  No deformity or lesions, dentition normal. Neck:  Supple; no masses or thyromegaly. Lungs:  Clear throughout to auscultation.   No wheezes, crackles, or rhonchi. No acute distress. Heart:  Regular rate and rhythm; no murmurs, clicks, rubs,  or gallops. Abdomen:  Multiple surgical scars Soft, nontender and nondistended. No masses, hepatosplenomegaly or hernias noted. Normal bowel sounds, without guarding, and without rebound.    Msk:  Symmetrical without gross deformities. Normal posture. Pulses:  Normal pulses noted. Extremities:  Without clubbing or edema. Neurologic:  Alert and  oriented x4;  grossly normal neurologically. Skin:  Intact without significant lesions or rashes. Cervical Nodes:  No significant cervical adenopathy. Psych:  Alert and cooperative. Normal mood and affect.           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 Last colonoscopy 2008. Intermittent rectal bleeding likely secondary to a benign anorectal source. However, with the longevity of her disease, interval surveillance/diagnostic colonoscopy now. I reviewed this approach with the patient at some length. The risks, benefits, limitations, alternatives and imponderables have been reviewed with the patient. Questions have been answered. All parties are agreeable.  Colonoscopy will  be set up at the hospital in the near future.    [name], MD  10/22/2010  3:01 PM  Signed Primary Care Physician:  Michel Bickers, MD, MD Primary Gastroenterologist:  Dr.    Pre-Procedure History & Physical: HPI:  Sheri [name] is a 60 y.o. female here for followup. History of ileocolonic Crohn's disease x30 years. Last colonoscopy 2008 she had benign polyps and active mucosal disease. She's been on Imuran and Pentasa. She's done fairly well; she has occasional diarrhea; intermittent rectal bleeding which has been felt to be duel to hemorrhoids.    Past Medical History   Diagnosis  Date   .  Crohn disease     .  Anemia     .  Low back pain     .  Fibromyalgia         Past Surgical History   Procedure  Date   .  Small intestine surgery  [id number]   .  Cesarean section  [id number]   .  Appendectomy  1990   .  Drainage of back abscess  2007   .  Colonoscopy  09/11/2006   .  Esophagogastroduodenoscopy  04/2009       Prior to Admission medications    Medication  Sig  Start Date  End Date  Taking?  Authorizing Provider   alendronate (FOSAMAX) 70 MG tablet  Take 1 tablet (70 mg total) by mouth once a week. Take with a full glass of water on an empty stomach.  06/19/10    Yes  Neil Crouch, PA   ALPRAZolam Duanne Moron) 1 MG tablet  Take 1 tablet (1 mg total) by mouth 2 (two) times daily. Take one tablet by mouth two times a day  06/19/10    Yes  Neil Crouch, PA   azaTHIOprine [facility]) 50 MG tablet  Take 2 by mouth daily  08/14/10    Yes  Neil Crouch, PA   Calcium Carbonate-Vitamin D (CALCIUM 600+D PO)  Take by mouth 2 (two) times daily.        Yes  Historical Provider, MD   citalopram (CELEXA) 20 MG tablet  Take 1 tablet (20 mg total) by mouth 2 (two) times daily.  06/19/10    Yes  Neil Crouch, PA   Cyanocobalamin (VITAMIN B-12 IJ)  Inject as directed every 30 (thirty) days.        Yes  Historical Provider, MD   cyclobenzaprine (FLEXERIL) 10 MG tablet  Take 10 mg by mouth as needed.       Yes   Historical Provider, MD   Fish Oil OIL  by Does not apply route daily.        Yes  Historical Provider, MD   hydrocortisone (ANUSOL-HC) 25 MG suppository  Place 25 mg rectally 2 (two) times daily as needed.       Yes  Historical Provider, MD   lisinopril-hydrochlorothiazide (PRINZIDE,ZESTORETIC) 20-12.5 MG per tablet  Take 1 tablet by mouth daily.  06/19/10    Yes  Neil Crouch, PA   Loratadine 10 MG CAPS  Take by mouth daily.  Yes  Historical Provider, MD   mesalamine (PENTASA) 500 MG CR capsule  Take 500 mg by mouth 4 (four) times daily. TAKE 2 CAPSULES BY MOUTH FOUR TIMES A DAY       Yes  Historical Provider, MD   multivitamin [facility]) per tablet  Take 1 tablet by mouth daily.        Yes  Historical Provider, MD   omeprazole (PRILOSEC) 20 MG capsule  Take 20 mg by mouth daily.        Yes  Historical Provider, MD   ondansetron (ZOFRAN) 4 MG tablet  Take 4 mg by mouth. ONE BY MOUTH EVERY 4-6 HOURS AS NEEDED FOR NAUSEA AND VOMITING       Yes  Historical Provider, MD   oxyCODONE-acetaminophen (PERCOCET) 10-325 MG per tablet  Take 1 tablet by mouth. TAKE ONE OR TWO TABLETS BY MOUTH EVERY 6 HOURS AS NEEDED       Yes  Historical Provider, MD   vitamin E 1000 UNIT capsule  Take 1,000 Units by mouth daily.        Yes  Historical Provider, MD   Zinc 50 MG TABS  Take by mouth daily.        Yes  Historical Provider, MD         Allergies as of 10/22/2010 - Review Complete 10/22/2010   Allergen  Reaction  Noted   .  Codeine sulfate          No family history on file.    History       Social History   .  Marital Status:  Legally Separated       Spouse Name:  N/A       Number of Children:  N/A   .  Years of Education:  N/A       Occupational History   .  Not on file.       Social History Main Topics   .  Smoking status:  Never Smoker    .  Smokeless tobacco:  Not on file   .  Alcohol Use:  Yes         frozen drinks here and there   .  Drug Use:  No   .  Sexually Active:  Not on  file       Other Topics  Concern   .  Not on file       Social History Narrative   .  No narrative on file      Review of Systems: See HPI, otherwise negative ROS   Physical Exam: BP 116/77  Pulse 102  Temp(Src) 98.1 F (36.7 C) (Temporal)  Ht 5' 10"  (1.778 m)  Wt 203 lb 3.2 oz (92.171 kg)  BMI 29.16 kg/m2 General:   Alert,  Well-developed, well-nourished, pleasant and cooperative in NAD Head:  Normocephalic and atraumatic. Eyes:  Sclera clear, no icterus.   Conjunctiva pink. Ears:  Normal auditory acuity. Nose:  No deformity, discharge,  or lesions. Mouth:  No deformity or lesions, dentition normal. Neck:  Supple; no masses or thyromegaly. Lungs:  Clear throughout to auscultation.   No wheezes, crackles, or rhonchi. No acute distress. Heart:  Regular rate and rhythm; no murmurs, clicks, rubs,  or gallops. Abdomen:  Multiple surgical scars Soft, nontender and nondistended. No masses, hepatosplenomegaly or hernias noted. Normal bowel sounds, without guarding, and without rebound.    Msk:  Symmetrical without gross deformities. Normal posture. Pulses:  Normal pulses noted. Extremities:  Without clubbing or edema. Neurologic:  Alert and  oriented x4;  grossly normal neurologically. Skin:  Intact without significant lesions or rashes. Cervical Nodes:  No significant cervical adenopathy. Psych:  Alert and cooperative. Normal mood and affect.           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 Last colonoscopy 2008. Intermittent rectal bleeding likely secondary to a benign anorectal source. However, with the longevity of her disease, interval surveillance/diagnostic colonoscopy now. I reviewed this approach with the patient at some length. The risks, benefits, limitations, alternatives and imponderables have been reviewed with the patient. Questions have been  answered. All parties are agreeable.  Colonoscopy will be set up at the hospital in the near future.     I have seen the patient prior to the procedure(s) today and reviewed the history and physical / consultation from 10/22/10.  There have been no changes. After consideration of the risks, benefits, alternatives and imponderables, the patient has consented to the procedure(s).

## 2010-10-30 NOTE — Op Note (Signed)
NAME:  Sheri [name], Sheri [name]                 ACCOUNT NO.:  [id number]  MEDICAL RECORD NO.:  [id number]  LOCATION:  [facility]                          FACILITY:  [facility]  PHYSICIAN:  R. Garfield Cornea, MD FACP FACGDATE OF BIRTH:  Apr 25, 1950  DATE OF PROCEDURE:  10/30/2010 DATE OF DISCHARGE:                              OPERATIVE REPORT   INDICATIONS FOR PROCEDURE:  A 60 year old lady with longstanding ileocolonic Crohn's disease with intermittent rectal bleeding here for colonoscopy.  She has intermittent bouts of diarrhea, treated with Pentasa and Imuran.  Risks, benefits, limitations, alternatives, possible approach have been reviewed.  Questions have been answered. Please see the documentation medical record.  PROCEDURE NOTE:  O2 saturation, blood pressure, pulse respirations monitored throughout the entire procedure.  Conscious sedation, Versed 8 mg IV, Demerol 100 mg IV in divided doses.  INSTRUMENT:  Pentax video chip system.  FINDINGS:  Digital rectal exam revealed no abnormalities aside from external hemorrhoidal tag.  The prep was adequate.  Colonic mucosa was surveyed from the rectosigmoid junction all around to the anastomosis from prior surgery with small bowel.  There is no inflammatory changes about the mucosal surface at the anastomosis.  The lumen of the anastomosis was quite stenotic and was dilated with traversal with the adult colonoscope.  There were ulcerations through the anastomosis and distal ileum was dilated with focal areas of all ulceration.  Please see photos.  From this level, scope was slowly and cautiously withdrawn. All previous mentioned mucosal surfaces were again seen.  The patient had sigmoid and descending diverticula.  The remainder of the residual colon appeared normal.  Biopsies of the neoterminal ileum were taken for histologic study.  Scope was pulled down the rectum.  Careful examination of rectal mucosa including retroflex view of anal verge and view of  the anal canal demonstrated only external and internal hemorrhoids, (which is likely source of hematochezia).  Patient tolerated the procedure, was doing well postop.  IMPRESSION: 1. External and internal hemorrhoids, likely source of hematochezia,     otherwise normal rectum. 2. Left-sided diverticula, status post prior right hemicolectomy with     a friable, inflamed, stenotic surgical anastomosis with upstream     dilation of the neoterminal ileum. 3. Crohn's noted, status post biopsy.  RECOMMENDATIONS: 1. Hemorrhoid literature provided to the patient, attending course of     Anusol HC suppositories one per rectum bedtime. 2. She is to continue Florajen 3 as a probiotic supplement. 3. Continue Pentasa and Imuran for time being.  We would like to see     if we get more efficacy from Imuran.  We will draw 6-thioguanine     level (60 G) to see if we have any need to increase the dose of     Imuran, which she is historically tolerated well.  Followup on path     to make further recommendations in the very near future.     Bridgette Habermann, MD FACP [facility]     RMR/MEDQ  D:  10/30/2010  T:  10/30/2010  Job:  [id number]

## 2010-11-06 ENCOUNTER — Encounter (HOSPITAL_COMMUNITY): Payer: Self-pay | Admitting: Internal Medicine

## 2010-11-06 ENCOUNTER — Encounter: Payer: Self-pay | Admitting: Internal Medicine

## 2010-11-19 NOTE — Progress Notes (Signed)
Quick Note:  Pt aware, she will call when she needs a refill d/t increased dose. Lab order on file. ______

## 2010-11-19 NOTE — Progress Notes (Signed)
Quick Note:  Tried to call pt- LMOM ______

## 2010-11-20 ENCOUNTER — Other Ambulatory Visit: Payer: Self-pay | Admitting: Internal Medicine

## 2010-11-20 DIAGNOSIS — Z79899 Other long term (current) drug therapy: Secondary | ICD-10-CM

## 2010-11-26 LAB — OVA AND PARASITE EXAMINATION

## 2010-11-26 LAB — STOOL CULTURE

## 2010-11-26 LAB — CLOSTRIDIUM DIFFICILE EIA

## 2010-12-05 ENCOUNTER — Ambulatory Visit (INDEPENDENT_AMBULATORY_CARE_PROVIDER_SITE_OTHER): Payer: Medicare Other | Admitting: Gastroenterology

## 2010-12-05 ENCOUNTER — Encounter: Payer: Self-pay | Admitting: Gastroenterology

## 2010-12-05 DIAGNOSIS — K5000 Crohn's disease of small intestine without complications: Secondary | ICD-10-CM

## 2010-12-05 DIAGNOSIS — M949 Disorder of cartilage, unspecified: Secondary | ICD-10-CM

## 2010-12-05 DIAGNOSIS — K50813 Crohn's disease of both small and large intestine with fistula: Secondary | ICD-10-CM

## 2010-12-05 DIAGNOSIS — K5080 Crohn's disease of both small and large intestine without complications: Secondary | ICD-10-CM

## 2010-12-05 DIAGNOSIS — M899 Disorder of bone, unspecified: Secondary | ICD-10-CM

## 2010-12-05 NOTE — Assessment & Plan Note (Signed)
Due for Bone density DEXA at any time. We will arrange for her.

## 2010-12-05 NOTE — Assessment & Plan Note (Signed)
Recent increase in Imuran. Too soon to see benefits. Crohn's symptoms stable with some increase in pain since decreasing her oxycodone. Will get copy of ov notes from Dr. Jeanie Cooks as patient believes her urine test did not show oxycodone due to malabsorption issues.   She is due for CBC at this time.

## 2010-12-05 NOTE — Patient Instructions (Addendum)
I will review your old records and determine when your next bone density study is due. I will request records from Dr. Jeanie Cooks and review with Dr. Gala Romney.

## 2010-12-05 NOTE — Progress Notes (Signed)
Please schedule her for a Dexa bone density study. I wasn't sure which xray option to choose under orders.  Please remind patient to have her CBC done. She may have had it in [place].

## 2010-12-05 NOTE — Progress Notes (Signed)
Primary Care Physician: Monico Blitz, MD  Primary Gastroenterologist:  Garfield Cornea, MD   Chief Complaint  Patient presents with  . Advice Only    about medication with crohns    HPI: Sheri [name] is a 60 y.o. female here with numerous questions about medications. She recently had her imuran dose increased to138m daily. She is due CBC at this time. Overall she states she has been stable with regards to Crohn's symptoms. She notes increased stress related to a murder of a 60y/o boy she knew. She also recently had urine testing at her pain [facility] and "failed" the test. She states there was no evidence of oxycodone in her urine. She states they are not going to provide her with new prescriptions and she doesn't know what to do. She believes she is malabsorbing her medications and therefore had a negative urine screen.   She admits to decreasing her oxycodone dose on days she has to be more active, stating she doesn't want to be sedated. She denies going more than 24 hours without pain meds.   She also wants to know when her next Bone Density Study is due. Her dentist wants her to come off of Fosamax for three months after she has a tooth extracted.  Difficult to focus patient today. She states she has had some increase in rlq pain as she has been lowering her dose of oxycodone. She has tapered her dose over the last two weeks b/c she does not want to go thru withdrawal. Her next appt with Dr. BBrien Fewis in 01/2011.     Current Outpatient Prescriptions  Medication Sig Dispense Refill  . alendronate (FOSAMAX) 70 MG tablet Take 1 tablet (70 mg total) by mouth once a week. Take with a full glass of water on an empty stomach.  4 tablet  0  . ALPRAZolam (XANAX) 1 MG tablet Take 1 tablet (1 mg total) by mouth 2 (two) times daily. Take one tablet by mouth two times a day  60 tablet  0  . azaTHIOprine (IMURAN) 50 MG tablet Take 125 mg by mouth daily.        .  Calcium Carbonate-Vitamin D (CALCIUM 600+D PO) Take by mouth 2 (two) times daily.        . citalopram (CELEXA) 20 MG tablet Take 1 tablet (20 mg total) by mouth 2 (two) times daily.  60 tablet  0  . Cyanocobalamin (VITAMIN B-12 IJ) Inject as directed every 30 (thirty) days.        . cyclobenzaprine (FLEXERIL) 10 MG tablet Take 10 mg by mouth as needed.       . Fish Oil OIL by Does not apply route daily.        . fish oil-omega-3 fatty acids 1000 MG capsule Take 2 g by mouth daily.        . hydrocortisone (ANUSOL-HC) 25 MG suppository Place 25 mg rectally 2 (two) times daily as needed.       .Marland Kitchenlisinopril-hydrochlorothiazide (PRINZIDE,ZESTORETIC) 20-12.5 MG per tablet Take 1 tablet by mouth daily.  30 tablet  0  . Loratadine 10 MG CAPS Take by mouth daily.        . mesalamine (PENTASA) 500 MG CR capsule Take 1,000 mg by mouth 4 (four) times daily. TAKE 2 CAPSULES BY MOUTH FOUR TIMES A DAY      . multivitamin (THERAGRAN) per tablet Take 1 tablet by mouth daily.        .Marland Kitchen  omeprazole (PRILOSEC) 20 MG capsule Take 20 mg by mouth daily.        Marland Kitchen oxyCODONE (ROXICODONE) 15 MG immediate release tablet Take 15 mg by mouth every 4 (four) hours as needed.       . vitamin E 1000 UNIT capsule Take 1,000 Units by mouth daily.        . Zinc 50 MG TABS Take by mouth daily.        Marland Kitchen zinc gluconate 50 MG tablet Take 50 mg by mouth daily.        . ondansetron (ZOFRAN) 4 MG tablet Take 4 mg by mouth. ONE BY MOUTH EVERY 4-6 HOURS AS NEEDED FOR NAUSEA AND VOMITING         Allergies as of 12/05/2010 - Review Complete 12/05/2010  Allergen Reaction Noted  . Codeine sulfate      ROS:  General: Negative for anorexia, weight loss, fever, chills, fatigue, weakness. ENT: Negative for hoarseness, difficulty swallowing , nasal congestion. CV: Negative for chest pain, angina, palpitations, dyspnea on exertion, peripheral edema.  Respiratory: Negative for dyspnea at rest, dyspnea on exertion, cough, sputum, wheezing.  GI:  See history of present illness. GU:  Negative for dysuria, hematuria, urinary incontinence, urinary frequency, nocturnal urination.  Endo: Negative for unusual weight change.    Physical Examination:   BP 119/73  Pulse 90  Temp(Src) 97.4 F (36.3 C) (Temporal)  Ht 5' 10"  (1.778 m)  Wt 200 lb (90.719 kg)  BMI 28.70 kg/m2  General: Well-nourished, well-developed in no acute distress.  Eyes: No icterus. Mouth: Oropharyngeal mucosa moist and pink , no lesions erythema or exudate. Lungs: Clear to auscultation bilaterally.  Heart: Regular rate and rhythm, no murmurs rubs or gallops.  Abdomen: Bowel sounds are normal, RLQ tenderness to deep palpation, nondistended, no hepatosplenomegaly or masses, no abdominal bruits or hernia , no rebound or guarding.   Extremities: No lower extremity edema. No clubbing or deformities. Neuro: Alert and oriented x 4   Skin: Warm and dry, no jaundice.   Psych: Alert and cooperative, normal mood and affect. Tearful at times.

## 2010-12-06 NOTE — Progress Notes (Signed)
Cc to PCP 

## 2010-12-10 LAB — CBC WITH DIFFERENTIAL/PLATELET
HCT: 37 %
Hemoglobin: 13 g/dL (ref 12.0–16.0)
WBC: 5.8
platelet count: 244

## 2010-12-13 NOTE — Progress Notes (Signed)
Labs on LSL cart.

## 2010-12-13 NOTE — Progress Notes (Signed)
Pt aware, ok to schedule dexa scan. Pt just had cbc done this week at the [facility] center. Will get results faxed to [place].

## 2010-12-17 ENCOUNTER — Other Ambulatory Visit: Payer: Self-pay | Admitting: Internal Medicine

## 2010-12-17 DIAGNOSIS — K5080 Crohn's disease of both small and large intestine without complications: Secondary | ICD-10-CM

## 2010-12-19 ENCOUNTER — Other Ambulatory Visit: Payer: Self-pay | Admitting: Gastroenterology

## 2010-12-19 DIAGNOSIS — M899 Disorder of bone, unspecified: Secondary | ICD-10-CM

## 2010-12-19 DIAGNOSIS — M8580 Other specified disorders of bone density and structure, unspecified site: Secondary | ICD-10-CM

## 2010-12-19 NOTE — Progress Notes (Signed)
Pt is scheduled for Bone density scan on 11/12/ @ 10:00- pt is aware

## 2010-12-24 ENCOUNTER — Other Ambulatory Visit (HOSPITAL_COMMUNITY): Payer: Medicare Other

## 2010-12-24 ENCOUNTER — Ambulatory Visit (HOSPITAL_COMMUNITY)
Admission: RE | Admit: 2010-12-24 | Discharge: 2010-12-24 | Disposition: A | Discharge status: Home / Self Care | Payer: Medicare Other | Source: Ambulatory Visit | Attending: Gastroenterology | Admitting: Gastroenterology

## 2010-12-24 DIAGNOSIS — M8580 Other specified disorders of bone density and structure, unspecified site: Secondary | ICD-10-CM

## 2010-12-24 DIAGNOSIS — Z780 Asymptomatic menopausal state: Secondary | ICD-10-CM | POA: Insufficient documentation

## 2010-12-24 DIAGNOSIS — M899 Disorder of bone, unspecified: Secondary | ICD-10-CM | POA: Insufficient documentation

## 2011-01-07 NOTE — Progress Notes (Signed)
Quick Note:  Patient should also be due for a cbc  ______

## 2011-01-07 NOTE — Progress Notes (Signed)
Quick Note:  Bone density study shows improvement of bone mineral density of the AP spine. Would recommend continuing Fosamax, calcium 1200 mg daily with vitamin D 400-800 international units daily. Repeat bone density DEXA scan in 2 years. ______

## 2011-01-08 NOTE — Progress Notes (Signed)
Quick Note:  Pt aware ______ 

## 2011-01-09 ENCOUNTER — Encounter: Payer: Self-pay | Admitting: Gastroenterology

## 2011-01-09 ENCOUNTER — Other Ambulatory Visit: Payer: Self-pay | Admitting: Internal Medicine

## 2011-01-09 ENCOUNTER — Ambulatory Visit (INDEPENDENT_AMBULATORY_CARE_PROVIDER_SITE_OTHER): Payer: Medicare Other | Admitting: Gastroenterology

## 2011-01-09 VITALS — BP 99/67 | HR 84 | Temp 98.4°F | Ht 70.0 in | Wt 197.6 lb

## 2011-01-09 DIAGNOSIS — K5080 Crohn's disease of both small and large intestine without complications: Secondary | ICD-10-CM

## 2011-01-09 DIAGNOSIS — K50813 Crohn's disease of both small and large intestine with fistula: Secondary | ICD-10-CM

## 2011-01-09 DIAGNOSIS — R300 Dysuria: Secondary | ICD-10-CM | POA: Insufficient documentation

## 2011-01-09 DIAGNOSIS — M545 Low back pain: Secondary | ICD-10-CM

## 2011-01-09 DIAGNOSIS — R197 Diarrhea, unspecified: Secondary | ICD-10-CM

## 2011-01-09 MED ORDER — RESTORA PO CAPS: 1.0000 | ORAL_CAPSULE | Freq: Every day | ORAL | Status: DC

## 2011-01-09 NOTE — Progress Notes (Signed)
Primary Care Physician: Monico Blitz, MD  Primary Gastroenterologist:  Garfield Cornea, MD    Chief Complaint  Patient presents with  . Crohn's Disease    HPI: Sheri [name] is a 60 y.o. female here for complaints of flare of her Crohn's disease. She was last seen about one month ago for followup. She was doing well at that time. Shortly after that visit she had to take amoxicillin for tooth extraction surgery. After being on amoxicillin for one week she developed diarrhea.  Now five weeks into episode and maybe some better. 12/13/10 dental surgery. Two teeth extractions under sedation. Two weeks very bad diarrhea. After two weeks a little consistency of her stool. Stools still not her normal. BM 15-20 daily initially. BM 8-12 now. Normal 6-8. No blood in stool. RLQ pain and towards back. Pain increased from her baseline. States the pain in the right lower back is different from her chronic back pain.  Appetite okay. No dysuria, hematuria. No n/v.  She has an appointment to see her pain management doctor in 3-4 weeks. She is still concerned about the fact that her oxycodone did not show up in her urine drug screen despite the fact that she has been taking it. She believes it is due to her Crohn's disease and rapid transit through her bowels.  Since her last office visit she did have a bone density study which showed improvement. She has been on Fosamax chronically but tells me her dentist recently asked her to hold it for several months due to recent tooth extraction surgery.  Current Outpatient Prescriptions  Medication Sig Dispense Refill  . ALPRAZolam (XANAX) 1 MG tablet Take 1 tablet (1 mg total) by mouth 2 (two) times daily. Take one tablet by mouth two times a day  60 tablet  0  . azaTHIOprine (IMURAN) 50 MG tablet Take 125 mg by mouth daily.        . Calcium Carbonate-Vitamin D (CALCIUM 600+D PO) Take by mouth 2 (two) times daily.        . citalopram (CELEXA) 20 MG tablet Take 1 tablet (20  mg total) by mouth 2 (two) times daily.  60 tablet  0  . Cyanocobalamin (VITAMIN B-12 IJ) Inject as directed every 30 (thirty) days.        . cyclobenzaprine (FLEXERIL) 10 MG tablet Take 10 mg by mouth as needed.       . Fish Oil OIL by Does not apply route daily.        . fish oil-omega-3 fatty acids 1000 MG capsule Take 2 g by mouth daily.        . hydrocortisone (ANUSOL-HC) 25 MG suppository Place 25 mg rectally 2 (two) times daily as needed.       Marland Kitchen lisinopril-hydrochlorothiazide (PRINZIDE,ZESTORETIC) 20-12.5 MG per tablet Take 1 tablet by mouth daily.  30 tablet  0  . Loratadine 10 MG CAPS Take by mouth daily.        . mesalamine (PENTASA) 500 MG CR capsule Take 1,000 mg by mouth 4 (four) times daily. TAKE 2 CAPSULES BY MOUTH FOUR TIMES A DAY      . multivitamin (THERAGRAN) per tablet Take 1 tablet by mouth daily.        Marland Kitchen omeprazole (PRILOSEC) 20 MG capsule Take 20 mg by mouth daily.        . ondansetron (ZOFRAN) 4 MG tablet Take 4 mg by mouth. ONE BY MOUTH EVERY 4-6 HOURS AS NEEDED FOR NAUSEA AND VOMITING       .  oxyCODONE (ROXICODONE) 15 MG immediate release tablet Take 15 mg by mouth every 4 (four) hours as needed.       . vitamin E 1000 UNIT capsule Take 1,000 Units by mouth daily.        . Zinc 50 MG TABS Take by mouth daily.        Marland Kitchen zinc gluconate 50 MG tablet Take 50 mg by mouth daily.        Marland Kitchen alendronate (FOSAMAX) 70 MG tablet Take 1 tablet (70 mg total) by mouth once a week. Take with a full glass of water on an empty stomach.  4 tablet  0    Allergies as of 01/09/2011 - Review Complete 01/09/2011  Allergen Reaction Noted  . Codeine sulfate      ROS:  General: Negative for anorexia, weight loss, fever, chills, fatigue, weakness. ENT: Negative for hoarseness, difficulty swallowing , nasal congestion. CV: Negative for chest pain, angina, palpitations, dyspnea on exertion, peripheral edema.  Respiratory: Negative for dyspnea at rest, dyspnea on exertion, cough, sputum,  wheezing.  GI: See history of present illness. GU:  Negative for dysuria, hematuria, urinary incontinence, urinary frequency, nocturnal urination.  Endo: Negative for unusual weight change.    Physical Examination:   BP 99/67  Pulse 84  Temp(Src) 98.4 F (36.9 C) (Temporal)  Ht 5' 10"  (1.778 m)  Wt 197 lb 9.6 oz (89.631 kg)  BMI 28.35 kg/m2  General: Well-nourished, well-developed in no acute distress.  Eyes: No icterus. Mouth: Oropharyngeal mucosa moist and pink , no lesions erythema or exudate. Lungs: Clear to auscultation bilaterally.  Heart: Regular rate and rhythm, no murmurs rubs or gallops.  Abdomen: Bowel sounds are normal, mild right lower quadrant tenderness, nondistended, no hepatosplenomegaly or masses, no abdominal bruits or hernia , no rebound or guarding.   Extremities: No lower extremity edema. No clubbing or deformities. Neuro: Alert and oriented x 4   Skin: Warm and dry, no jaundice.   Psych: Alert and cooperative, normal mood and affect.

## 2011-01-09 NOTE — Patient Instructions (Signed)
Please collect stool and urine. Please have your lab work. Please take Restora one daily. This is a probiotic. Samples provided. Take for one month.

## 2011-01-10 ENCOUNTER — Encounter: Payer: Self-pay | Admitting: Gastroenterology

## 2011-01-10 LAB — CBC WITH DIFFERENTIAL/PLATELET
Basophils Absolute: 0 10*3/uL (ref 0.0–0.1)
Basophils Relative: 0 % (ref 0–1)
HCT: 34.7 % — ABNORMAL LOW (ref 36.0–46.0)
Lymphocytes Relative: 22 % (ref 12–46)
MCHC: 34.3 g/dL (ref 30.0–36.0)
Monocytes Absolute: 0.5 10*3/uL (ref 0.1–1.0)
Neutro Abs: 4.3 10*3/uL (ref 1.7–7.7)
Neutrophils Relative %: 69 % (ref 43–77)
RDW: 14 % (ref 11.5–15.5)
WBC: 6.2 10*3/uL (ref 4.0–10.5)

## 2011-01-10 LAB — CLOSTRIDIUM DIFFICILE BY PCR: Toxigenic C. Difficile by PCR: NOT DETECTED

## 2011-01-10 LAB — URINALYSIS W MICROSCOPIC + REFLEX CULTURE
Bacteria, UA: NONE SEEN
Bilirubin Urine: NEGATIVE
Casts: NONE SEEN
Hgb urine dipstick: NEGATIVE
Ketones, ur: NEGATIVE mg/dL
Nitrite: NEGATIVE
Urobilinogen, UA: 0.2 mg/dL (ref 0.0–1.0)
pH: 5.5 (ref 5.0–8.0)

## 2011-01-10 NOTE — Progress Notes (Signed)
Cc to PCP 

## 2011-01-10 NOTE — Assessment & Plan Note (Signed)
Check urinalysis for dysuria and low back pain.

## 2011-01-10 NOTE — Progress Notes (Signed)
Quick Note:  Urine looks good. Await stool test and CBC. ______

## 2011-01-10 NOTE — Assessment & Plan Note (Signed)
Chronic pain, managed by Dr. Brien Few. She has asked that we provide some sort of written opinion regarding the possibility that her urine drug screen was negative because of her Crohn's disease. To discuss with Dr. Gala Romney.

## 2011-01-10 NOTE — Progress Notes (Signed)
Quick Note:  No CDiff. Start prednisone for Crohn's flare. Please call in prednisone 44m tablets. Take three (335m po daily for 5 days, then 2 (2014mpo daily for 5 days, then 1.5 (54m6mo daily for 5 days, 1 (10mg60m daily for five days then off. Call in quantity sufficient and no refills. She should call if no better. ______

## 2011-01-10 NOTE — Assessment & Plan Note (Signed)
Acute on chronic diarrhea, five-week history of increased number of stools but daily. Symptoms occurred one week after starting amoxicillin. Need to rule out C. difficile. If no evidence of infection, consider short course of prednisone to regain remission. We'll also check CBC, she is due for followup because of her Imuran.

## 2011-01-11 NOTE — Progress Notes (Signed)
Pt informed and Rx called to [place] at [facility]

## 2011-01-11 NOTE — Progress Notes (Signed)
Pt aware of results 

## 2011-01-11 NOTE — Progress Notes (Signed)
Quick Note:  Pt aware of results. ______ 

## 2011-01-11 NOTE — Progress Notes (Signed)
Quick Note:  Pt was informed and Rx called to [place] at [facility] ______

## 2011-01-14 ENCOUNTER — Other Ambulatory Visit: Payer: Self-pay | Admitting: Internal Medicine

## 2011-01-14 DIAGNOSIS — D649 Anemia, unspecified: Secondary | ICD-10-CM

## 2011-01-14 NOTE — Progress Notes (Signed)
Quick Note:  Lab order on file  ______

## 2011-02-11 ENCOUNTER — Other Ambulatory Visit: Payer: Self-pay

## 2011-02-11 MED ORDER — AZATHIOPRINE 50 MG PO TABS: 125.0000 mg | ORAL_TABLET | Freq: Every day | ORAL | Status: DC

## 2011-02-22 ENCOUNTER — Other Ambulatory Visit: Payer: Self-pay

## 2011-02-22 MED ORDER — MESALAMINE ER 500 MG PO CPCR: 1000.0000 mg | ORAL_CAPSULE | Freq: Four times a day (QID) | ORAL | Status: DC

## 2011-03-07 ENCOUNTER — Encounter: Payer: Self-pay | Admitting: Internal Medicine

## 2011-04-03 LAB — COMPREHENSIVE METABOLIC PANEL
ALT: 19 U/L (ref 7–35)
Albumin: 4
TSH: 1.78 u[IU]/mL (ref 0.41–5.90)
Total bilirubin, fluid: 1.6

## 2011-04-03 LAB — CBC WITH DIFFERENTIAL/PLATELET
HCT: 37 %
Hemoglobin: 12.6 g/dL (ref 12.0–16.0)
WBC: 6.1

## 2011-04-09 ENCOUNTER — Other Ambulatory Visit: Payer: Self-pay | Admitting: Gastroenterology

## 2011-04-09 DIAGNOSIS — K5080 Crohn's disease of both small and large intestine without complications: Secondary | ICD-10-CM

## 2011-04-09 NOTE — Progress Notes (Signed)
Quick Note:  CBC in 3 months. ______

## 2011-04-09 NOTE — Progress Notes (Signed)
Quick Note:  Lab order on file. ______

## 2011-06-10 ENCOUNTER — Other Ambulatory Visit: Payer: Self-pay

## 2011-06-10 DIAGNOSIS — K5080 Crohn's disease of both small and large intestine without complications: Secondary | ICD-10-CM

## 2011-06-26 NOTE — Progress Notes (Signed)
LMOM to call back

## 2011-07-22 LAB — CBC WITH DIFFERENTIAL/PLATELET
Eosinophils Absolute: 0.1 10*3/uL (ref 0.0–0.7)
Hemoglobin: 12.7 g/dL (ref 12.0–15.0)
Lymphocytes Relative: 21 % (ref 12–46)
Lymphs Abs: 1.5 10*3/uL (ref 0.7–4.0)
Monocytes Relative: 7 % (ref 3–12)
Neutro Abs: 4.9 10*3/uL (ref 1.7–7.7)
Neutrophils Relative %: 71 % (ref 43–77)
Platelets: 253 10*3/uL (ref 150–400)
RBC: 3.82 MIL/uL — ABNORMAL LOW (ref 3.87–5.11)
WBC: 7 10*3/uL (ref 4.0–10.5)

## 2011-07-23 ENCOUNTER — Other Ambulatory Visit: Payer: Self-pay | Admitting: Gastroenterology

## 2011-07-23 DIAGNOSIS — D649 Anemia, unspecified: Secondary | ICD-10-CM

## 2011-07-23 NOTE — Progress Notes (Signed)
Quick Note:  CBC looks good. Next CBC in 3 months. Nonurgent OV with RMR 10/2011. Patient should have her labs before ov. ______

## 2011-08-19 ENCOUNTER — Other Ambulatory Visit: Payer: Self-pay

## 2011-08-19 MED ORDER — AZATHIOPRINE 50 MG PO TABS: 125.0000 mg | ORAL_TABLET | Freq: Every day | ORAL | Status: DC

## 2011-08-27 NOTE — Telephone Encounter (Signed)
Open in error

## 2011-09-25 ENCOUNTER — Other Ambulatory Visit: Payer: Self-pay

## 2011-09-25 DIAGNOSIS — D649 Anemia, unspecified: Secondary | ICD-10-CM

## 2011-10-21 ENCOUNTER — Telehealth: Payer: Self-pay | Admitting: Internal Medicine

## 2011-10-21 NOTE — Telephone Encounter (Signed)
Pt is due to have CBC prior to OV with RMR

## 2011-10-21 NOTE — Telephone Encounter (Signed)
I mailed out a letter and the orders for her to complete the blood work in Sept.

## 2011-10-25 ENCOUNTER — Telehealth: Payer: Self-pay | Admitting: Gastroenterology

## 2011-10-25 NOTE — Telephone Encounter (Signed)
Patient supposed to have OV with RMR this month with labs a couple of days before. Please arrange.

## 2011-10-25 NOTE — Telephone Encounter (Signed)
Lab order has already been mailed to pt.

## 2011-10-30 LAB — CBC WITH DIFFERENTIAL/PLATELET
Basophils Absolute: 0 10*3/uL (ref 0.0–0.1)
Eosinophils Relative: 2 % (ref 0–5)
HCT: 35.6 % — ABNORMAL LOW (ref 36.0–46.0)
Lymphocytes Relative: 26 % (ref 12–46)
Lymphs Abs: 1.7 10*3/uL (ref 0.7–4.0)
MCV: 94.9 fL (ref 78.0–100.0)
Monocytes Absolute: 0.7 10*3/uL (ref 0.1–1.0)
Neutro Abs: 4 10*3/uL (ref 1.7–7.7)
Platelets: 250 10*3/uL (ref 150–400)
RBC: 3.75 MIL/uL — ABNORMAL LOW (ref 3.87–5.11)
RDW: 13.5 % (ref 11.5–15.5)
WBC: 6.5 10*3/uL (ref 4.0–10.5)

## 2011-10-31 NOTE — Telephone Encounter (Signed)
Needs OV with RMR.

## 2011-10-31 NOTE — Progress Notes (Signed)
Quick Note:  Please make her f/u OV with RMR only first available.  CBC stable. Recheck in 4 months. ______

## 2011-11-01 ENCOUNTER — Other Ambulatory Visit: Payer: Self-pay

## 2011-11-01 DIAGNOSIS — D649 Anemia, unspecified: Secondary | ICD-10-CM

## 2011-11-01 NOTE — Telephone Encounter (Signed)
Pt is aware of OV with RMR on 10/11 @ 11

## 2011-11-22 ENCOUNTER — Ambulatory Visit: Payer: Medicare Other | Admitting: Internal Medicine

## 2011-12-17 ENCOUNTER — Encounter: Payer: Self-pay | Admitting: Internal Medicine

## 2011-12-18 ENCOUNTER — Ambulatory Visit (INDEPENDENT_AMBULATORY_CARE_PROVIDER_SITE_OTHER): Payer: Medicare Other | Admitting: Gastroenterology

## 2011-12-18 ENCOUNTER — Encounter: Payer: Self-pay | Admitting: Gastroenterology

## 2011-12-18 VITALS — BP 111/69 | HR 82 | Temp 98.6°F | Ht 70.0 in | Wt 193.0 lb

## 2011-12-18 DIAGNOSIS — K5080 Crohn's disease of both small and large intestine without complications: Secondary | ICD-10-CM

## 2011-12-18 DIAGNOSIS — K50813 Crohn's disease of both small and large intestine with fistula: Secondary | ICD-10-CM

## 2011-12-18 DIAGNOSIS — K219 Gastro-esophageal reflux disease without esophagitis: Secondary | ICD-10-CM

## 2011-12-18 MED ORDER — AZATHIOPRINE 50 MG PO TABS: 125.0000 mg | ORAL_TABLET | Freq: Every day | ORAL | Status: DC

## 2011-12-18 NOTE — Patient Instructions (Addendum)
You will be due for labs in 03/2012. Office visit with Dr. Gala Romney in 06/2012.

## 2011-12-18 NOTE — Progress Notes (Signed)
Primary Care Physician: Monico Blitz, MD  Primary Gastroenterologist:  Garfield Cornea, MD   Chief Complaint  Patient presents with  . Follow-up    HPI: Sheri [name] is a 61 y.o. female here for f/u of Crohn's disease. She was last seen in November 2012. At that time she was having a mild flare. Overall she states she's been doing very well. Bm 3-5 per day, normally. Couple of times per month maybe a little more. [place] daily. Denies melena or rectal bleeding. Denies abdominal pain, anorexia, vomiting, heartburn. She remains on Imuran and Pentasa for her Crohn's disease. She is on omeprazole for her GERD.  She continues to have chronic back pain and is followed by pain management.  Current Outpatient Prescriptions  Medication Sig Dispense Refill  . alendronate (FOSAMAX) 70 MG tablet Take 1 tablet (70 mg total) by mouth once a week. Take with a full glass of water on an empty stomach.  4 tablet  0  . ALPRAZolam (XANAX) 1 MG tablet Take 1 tablet (1 mg total) by mouth 2 (two) times daily. Take one tablet by mouth two times a day  60 tablet  0  . azaTHIOprine (IMURAN) 50 MG tablet Take 2.5 tablets (125 mg total) by mouth daily.  75 tablet  3  . Calcium Carbonate-Vitamin D (CALCIUM 600+D PO) Take by mouth 2 (two) times daily.        . citalopram (CELEXA) 20 MG tablet Take 1 tablet (20 mg total) by mouth 2 (two) times daily.  60 tablet  0  . Cyanocobalamin (VITAMIN B-12 IJ) Inject as directed every 30 (thirty) days.        . cyclobenzaprine (FLEXERIL) 10 MG tablet Take 10 mg by mouth as needed.       . fish oil-omega-3 fatty acids 1000 MG capsule Take 2 g by mouth daily.        Marland Kitchen lisinopril-hydrochlorothiazide (PRINZIDE,ZESTORETIC) 20-12.5 MG per tablet Take 1 tablet by mouth daily.  30 tablet  0  . Loratadine 10 MG CAPS Take by mouth daily.        . mesalamine (PENTASA) 500 MG CR capsule Take 2 capsules (1,000 mg total) by mouth 4 (four) times daily.  240 capsule  11  . multivitamin (THERAGRAN)  per tablet Take 1 tablet by mouth daily.        Marland Kitchen omeprazole (PRILOSEC) 20 MG capsule Take 20 mg by mouth daily.        . ondansetron (ZOFRAN) 4 MG tablet Take 4 mg by mouth. ONE BY MOUTH EVERY 4-6 HOURS AS NEEDED FOR NAUSEA AND VOMITING       . oxyCODONE (ROXICODONE) 15 MG immediate release tablet Take 15 mg by mouth every 4 (four) hours as needed.       . Probiotic Product (RESTORA) CAPS Take 1 capsule by mouth daily.  28 capsule  0  . vitamin E 1000 UNIT capsule Take 1,000 Units by mouth daily.        Marland Kitchen zinc gluconate 50 MG tablet Take 50 mg by mouth daily.          Allergies as of 12/18/2011 - Review Complete 12/18/2011  Allergen Reaction Noted  . Codeine sulfate      ROS:  General: Negative for anorexia, weight loss, fever, chills, fatigue, weakness. ENT: Negative for hoarseness, difficulty swallowing , nasal congestion. CV: Negative for chest pain, angina, palpitations, dyspnea on exertion, peripheral edema.  Respiratory: Negative for dyspnea at rest, dyspnea on exertion, cough, sputum,  wheezing.  GI: See history of present illness. GU:  Negative for dysuria, hematuria, urinary incontinence, urinary frequency, nocturnal urination.  Endo: Negative for unusual weight change.    Physical Examination:   BP 111/69  Pulse 82  Temp 98.6 F (37 C) (Temporal)  Ht 5' 10"  (1.778 m)  Wt 193 lb (87.544 kg)  BMI 27.69 kg/m2  General: Well-nourished, well-developed in no acute distress.  Eyes: No icterus. Mouth: Oropharyngeal mucosa moist and pink , no lesions erythema or exudate. Lungs: Clear to auscultation bilaterally.  Heart: Regular rate and rhythm, no murmurs rubs or gallops.  Abdomen: Bowel sounds are normal, nontender, nondistended, no hepatosplenomegaly or masses, no abdominal bruits or hernia , no rebound or guarding.   Extremities: No lower extremity edema. No clubbing or deformities. Neuro: Alert and oriented x 4   Skin: Warm and dry, no jaundice.   Psych: Alert and  cooperative, normal mood and affect.  Labs:  Lab Results  Component Value Date   WBC 6.5 10/30/2011   HGB 12.6 10/30/2011   HCT 35.6* 10/30/2011   MCV 94.9 10/30/2011   PLT 250 10/30/2011    Imaging Studies: No results found.

## 2011-12-19 ENCOUNTER — Other Ambulatory Visit: Payer: Self-pay | Admitting: Gastroenterology

## 2011-12-19 ENCOUNTER — Ambulatory Visit: Payer: Medicare Other | Admitting: Gastroenterology

## 2011-12-19 DIAGNOSIS — K5090 Crohn's disease, unspecified, without complications: Secondary | ICD-10-CM

## 2011-12-20 ENCOUNTER — Ambulatory Visit: Payer: Medicare Other | Admitting: Urgent Care

## 2011-12-20 ENCOUNTER — Ambulatory Visit: Payer: Medicare Other | Admitting: Internal Medicine

## 2011-12-20 ENCOUNTER — Encounter: Payer: Self-pay | Admitting: Gastroenterology

## 2011-12-20 NOTE — Assessment & Plan Note (Signed)
Clinically doing well. Continue current regimen. Due LFTs/CBC in 03/2012. Office visit with Dr. Gala Romney only in 06/2012.

## 2011-12-20 NOTE — Assessment & Plan Note (Signed)
Continue prilosec as before.

## 2011-12-20 NOTE — Progress Notes (Signed)
Faxed to PCP

## 2012-01-14 ENCOUNTER — Other Ambulatory Visit: Payer: Self-pay

## 2012-01-14 DIAGNOSIS — D649 Anemia, unspecified: Secondary | ICD-10-CM

## 2012-02-10 ENCOUNTER — Other Ambulatory Visit: Payer: Self-pay

## 2012-02-10 ENCOUNTER — Other Ambulatory Visit: Payer: Self-pay | Admitting: Gastroenterology

## 2012-02-28 ENCOUNTER — Telehealth: Payer: Self-pay | Admitting: *Deleted

## 2012-02-28 NOTE — Telephone Encounter (Signed)
Ms Wilcoxen called today. She has sores in her mouth again and she would like a prescription for mouth wash to help her. Please follow up. Thank you.

## 2012-03-02 ENCOUNTER — Other Ambulatory Visit: Payer: Self-pay

## 2012-03-02 DIAGNOSIS — K5090 Crohn's disease, unspecified, without complications: Secondary | ICD-10-CM

## 2012-03-03 NOTE — Telephone Encounter (Signed)
I called Pt today to follow up about the mouth wash. She contacted her PCP and they called in the Rx for the mouth wash.

## 2012-03-12 LAB — CBC WITH DIFFERENTIAL/PLATELET
HCT: 35.9 % — ABNORMAL LOW (ref 36.0–46.0)
Hemoglobin: 12.6 g/dL (ref 12.0–15.0)
Lymphs Abs: 1.8 10*3/uL (ref 0.7–4.0)
Monocytes Absolute: 0.5 10*3/uL (ref 0.1–1.0)
Monocytes Relative: 6 % (ref 3–12)
Neutro Abs: 5.9 10*3/uL (ref 1.7–7.7)
Neutrophils Relative %: 72 % (ref 43–77)
RBC: 3.74 MIL/uL — ABNORMAL LOW (ref 3.87–5.11)

## 2012-03-13 NOTE — Progress Notes (Signed)
Quick Note:  Please call lab to find out if LFTS were drawn? If not, can we add on? Thanks ______

## 2012-03-23 NOTE — Progress Notes (Signed)
Quick Note:  Please let pt know her blood ct is normal, however lab left off LFTs. Unfortunately, we will need to re-draw at her earliest convenience unless she has had done elsewhere. Please apologize for lab error. Thanks RJ:PVGK,KDPTEL, MD  ______

## 2012-04-08 NOTE — Progress Notes (Signed)
Results faxed to PCP

## 2012-05-12 ENCOUNTER — Other Ambulatory Visit: Payer: Self-pay

## 2012-05-12 MED ORDER — MESALAMINE ER 500 MG PO CPCR: ORAL_CAPSULE | ORAL | Status: DC

## 2012-06-27 ENCOUNTER — Other Ambulatory Visit: Payer: Self-pay | Admitting: Gastroenterology

## 2012-06-29 NOTE — Telephone Encounter (Signed)
Patient needs updated CBC, LFTs.

## 2012-07-01 ENCOUNTER — Other Ambulatory Visit: Payer: Self-pay | Admitting: Gastroenterology

## 2012-07-01 ENCOUNTER — Other Ambulatory Visit: Payer: Self-pay

## 2012-07-01 DIAGNOSIS — K50813 Crohn's disease of both small and large intestine with fistula: Secondary | ICD-10-CM

## 2012-07-01 NOTE — Telephone Encounter (Signed)
Mailed letter and lab order to pt.

## 2012-07-07 ENCOUNTER — Encounter: Payer: Self-pay | Admitting: General Practice

## 2012-08-12 ENCOUNTER — Other Ambulatory Visit: Payer: Self-pay | Admitting: Gastroenterology

## 2012-08-12 NOTE — Telephone Encounter (Signed)
She is due ov with RMR only. She is known to reschedule OV multiple times. Please make sure she sees RMR next OV. Thanks.

## 2012-08-28 ENCOUNTER — Telehealth: Payer: Self-pay | Admitting: Gastroenterology

## 2012-08-28 NOTE — Telephone Encounter (Signed)
Please remind patient to have her bloodwork done.

## 2012-08-31 ENCOUNTER — Other Ambulatory Visit: Payer: Self-pay | Admitting: Gastroenterology

## 2012-09-01 NOTE — Telephone Encounter (Signed)
Patient needs to have labs done as requested in Feb 2014.  I refilled Imuran X 1.  She is overdue for f/u with RMR.

## 2012-09-01 NOTE — Telephone Encounter (Signed)
Routing to JL, RMR pt.

## 2012-09-01 NOTE — Telephone Encounter (Signed)
Pt is aware.  Sheri [name], please schedule ov with RMR

## 2012-09-01 NOTE — Telephone Encounter (Signed)
Lab orders faxed to the lab.

## 2012-09-01 NOTE — Telephone Encounter (Signed)
Pt aware and lab orders faxed to the lab.

## 2012-09-01 NOTE — Telephone Encounter (Signed)
Pt is scheduled to see RMR 8/8 at 11:30am

## 2012-09-04 LAB — CBC WITH DIFFERENTIAL/PLATELET
Eosinophils Relative: 2 % (ref 0–5)
HCT: 35.9 % — ABNORMAL LOW (ref 36.0–46.0)
Hemoglobin: 11.9 g/dL — ABNORMAL LOW (ref 12.0–15.0)
Lymphocytes Relative: 22 % (ref 12–46)
Lymphs Abs: 1.5 10*3/uL (ref 0.7–4.0)
MCH: 33.3 pg (ref 26.0–34.0)
MCV: 100.6 fL — ABNORMAL HIGH (ref 78.0–100.0)
Monocytes Absolute: 0.6 10*3/uL (ref 0.1–1.0)
Monocytes Relative: 8 % (ref 3–12)
RBC: 3.57 MIL/uL — ABNORMAL LOW (ref 3.87–5.11)
WBC: 6.8 10*3/uL (ref 4.0–10.5)

## 2012-09-04 LAB — HEPATIC FUNCTION PANEL
ALT: 16 U/L (ref 0–35)
AST: 17 U/L (ref 0–37)
Alkaline Phosphatase: 83 U/L (ref 39–117)
Indirect Bilirubin: 0.8 mg/dL (ref 0.0–0.9)
Total Protein: 6.8 g/dL (ref 6.0–8.3)

## 2012-09-16 NOTE — Progress Notes (Signed)
Quick Note:  Recommend repeating CBC in 6 weeks.  HFP reviewed: very mild elevation of Tbili and Direct bilirubin. Looks like she had similar pattern in Sept 2012. Otherwise, LFTs look good.  Would repeat at time of CBC in 6 weeks.  How is patient? Has an appt on 8/8 with RMR, but can we get a PR now. ______

## 2012-09-18 ENCOUNTER — Encounter: Payer: Self-pay | Admitting: Internal Medicine

## 2012-09-18 ENCOUNTER — Ambulatory Visit (INDEPENDENT_AMBULATORY_CARE_PROVIDER_SITE_OTHER): Payer: Medicare Other | Admitting: Internal Medicine

## 2012-09-18 VITALS — BP 128/74 | HR 85 | Temp 98.3°F | Ht 70.0 in | Wt 207.8 lb

## 2012-09-18 DIAGNOSIS — K219 Gastro-esophageal reflux disease without esophagitis: Secondary | ICD-10-CM

## 2012-09-18 DIAGNOSIS — K5080 Crohn's disease of both small and large intestine without complications: Secondary | ICD-10-CM

## 2012-09-18 MED ORDER — HYOSCYAMINE SULFATE 0.125 MG SL SUBL: 0.1250 mg | SUBLINGUAL_TABLET | Freq: Three times a day (TID) | SUBLINGUAL | Status: DC

## 2012-09-18 MED ORDER — AZATHIOPRINE 50 MG PO TABS: 50.0000 mg | ORAL_TABLET | Freq: Every day | ORAL | Status: DC

## 2012-09-18 NOTE — Patient Instructions (Signed)
Continue Imuran and Pentasa  CBC and LFT's in 6 weeks  Try Levsin 0.125 mg tablets as directed for cramps and diarrhea  Office visit in 6 months  (stool for occult blood testing just before next office visit )

## 2012-09-18 NOTE — Progress Notes (Signed)
Primary Care Physician:  Monico Blitz, MD Primary Gastroenterologist:  Dr. Gala Romney  Pre-Procedure History & Physical: HPI:  Sheri [name] is a 62 y.o. female here for followup ileocolonic Crohn's disease.Marland Kitchen History of GERD. Doing well she states that 70% of time the other part of the time she may have abdominal cramps and diarrhea for couple days.  She's not passing any blood. She's gained a significant amount of weight over the past year. Tolerating Imuran and Pentasa very well. Minimal bump in total bilirubin and direct component. Transaminases normal. Borderline anemia. Last colonoscopy 2012-active Crohn's disease.  On B12 supplementation.  Past Medical History  Diagnosis Date  . Crohn disease   . Anemia   . Low back pain   . Fibromyalgia   . Osteopenia     Last DEXA 12/2010 was normal, on fosamax    Past Surgical History  Procedure Laterality Date  . Small intestine surgery  [id number]  . Cesarean section  [id number]  . Appendectomy  1990  . Drainage of back abscess  2007  . Colonoscopy  09/11/2006  . Esophagogastroduodenoscopy  04/2009    noncritical schatzki's ring, small hh, sb bx negative  . Colonoscopy  10/30/2010    RMR: External and internal hemorrhoids, likely source of hematochezia  otherwise normal rectum/ Left-sided diverticula, status post prior right hemicolectomy with a friable, inflamed, stenotic surgical anastomosis with upstream dilation of the neoterminal ileum/ Crohn's noted, status post biopsy    Prior to Admission medications   Medication Sig Start Date End Date Taking? Authorizing Provider  ALPRAZolam Duanne Moron) 1 MG tablet Take 1 tablet (1 mg total) by mouth 2 (two) times daily. Take one tablet by mouth two times a day 06/19/10  Yes Mahala Menghini, PA-C  azaTHIOprine (IMURAN) 50 MG tablet TAKE 2&1/2 TABLETS ONCE DAILY. 08/31/12  Yes Orvil Feil, NP  baclofen (LIORESAL) 10 MG tablet Take 10 mg by mouth 3 (three) times daily.   Yes Historical Provider, MD  Calcium  Carbonate-Vitamin D (CALCIUM 600+D PO) Take by mouth 2 (two) times daily.     Yes Historical Provider, MD  citalopram (CELEXA) 20 MG tablet Take 1 tablet (20 mg total) by mouth 2 (two) times daily. 06/19/10  Yes Mahala Menghini, PA-C  Cyanocobalamin (VITAMIN B-12 IJ) Inject as directed every 30 (thirty) days.     Yes Historical Provider, MD  lisinopril-hydrochlorothiazide (PRINZIDE,ZESTORETIC) 20-12.5 MG per tablet Take 1 tablet by mouth daily. 06/19/10  Yes Mahala Menghini, PA-C  Loratadine 10 MG CAPS Take by mouth daily.     Yes Historical Provider, MD  multivitamin [facility]) per tablet Take 1 tablet by mouth daily.     Yes Historical Provider, MD  omeprazole (PRILOSEC) 20 MG capsule Take 20 mg by mouth daily.     Yes Historical Provider, MD  ondansetron (ZOFRAN) 4 MG tablet Take 4 mg by mouth. ONE BY MOUTH EVERY 4-6 HOURS AS NEEDED FOR NAUSEA AND VOMITING    Yes Historical Provider, MD  oxyCODONE (ROXICODONE) 15 MG immediate release tablet Take 15 mg by mouth every 4 (four) hours as needed.  11/12/10  Yes Historical Provider, MD  PENTASA 500 MG CR capsule TAKE (2) CAPSULES FOUR TIMES DAILY. 08/12/12  Yes Mahala Menghini, PA-C  Probiotic Product (RESTORA) CAPS Take 1 capsule by mouth daily. 01/09/11  Yes Mahala Menghini, PA-C  vitamin E 1000 UNIT capsule Take 1,000 Units by mouth daily.     Yes Historical Provider, MD  zinc gluconate 50 MG  tablet Take 50 mg by mouth daily.     Yes Historical Provider, MD  alendronate (FOSAMAX) 70 MG tablet Take 1 tablet (70 mg total) by mouth once a week. Take with a full glass of water on an empty stomach. 06/19/10   Mahala Menghini, PA-C  cyclobenzaprine (FLEXERIL) 10 MG tablet Take 10 mg by mouth as needed.     Historical Provider, MD  fish oil-omega-3 fatty acids 1000 MG capsule Take 2 g by mouth daily.      Historical Provider, MD    Allergies as of 09/18/2012 - Review Complete 09/18/2012  Allergen Reaction Noted  . Codeine sulfate      Family History  Problem  Relation Age of Onset  . Crohn's disease Mother     succumbed to stomach cancer in her 57s  . Colon cancer Neg Hx     History   Social History  . Marital Status: Legally Separated    Spouse Name: N/A    Number of Children: 2  . Years of Education: N/A   Occupational History  . disability    Social History Main Topics  . Smoking status: Never Smoker   . Smokeless tobacco: Not on file  . Alcohol Use: Yes     Comment: frozen drinks here and there  . Drug Use: No  . Sexually Active: Not on file   Other Topics Concern  . Not on file   Social History Narrative  . No narrative on file    Review of Systems: See HPI, otherwise negative ROS  Physical Exam: BP 128/74  Pulse 85  Temp(Src) 98.3 F (36.8 C) (Oral)  Ht 5' 10"  (1.778 m)  Wt 207 lb 12.8 oz (94.257 kg)  BMI 29.82 kg/m2 General:   Alert,  Well-developed, well-nourished, obese pleasant and cooperative in NAD Skin:  Intact without significant lesions or rashes. Eyes:  Sclera clear, no icterus.   Conjunctiva pink. Ears:  Normal auditory acuity. Nose:  No deformity, discharge,  or lesions. Mouth:  No deformity or lesions. Neck:  Supple; no masses or thyromegaly. No significant cervical adenopathy. Lungs:  Clear throughout to auscultation.   No wheezes, crackles, or rhonchi. No acute distress. Heart:  Regular rate and rhythm; no murmurs, clicks, rubs,  or gallops. Abdomen: Non-distended, normal bowel sounds.  Soft and nontender without appreciable mass or hepatosplenomegaly.  Pulses:  Normal pulses noted. Extremities:  Without clubbing or edema.  Impression/Plan:  Ileocolonic Crohn's disease  -  doing fairly well. She does have issues about 30% of the time. Otherwise, she is doing well. Borderline elevated bilirubin borderline anemia of uncertain significance. GERD symptoms well controlled on omeprazole. Tolerating her medication regimen  well.      Recommendations: Continue omeprazole. Continue Pentasa and Imuran.  We'll repeat LFTs and CBC in 6 weeks. We'll try Levsin sublingually 0.125 mg a.c. and at bedtime when necessary abdominal cramps. Continue B12 supplementation. Office visit 6 months.

## 2012-09-22 ENCOUNTER — Other Ambulatory Visit: Payer: Self-pay

## 2012-09-22 DIAGNOSIS — D539 Nutritional anemia, unspecified: Secondary | ICD-10-CM

## 2012-09-22 DIAGNOSIS — K50813 Crohn's disease of both small and large intestine with fistula: Secondary | ICD-10-CM

## 2012-10-01 ENCOUNTER — Telehealth: Payer: Self-pay | Admitting: *Deleted

## 2012-10-01 NOTE — Telephone Encounter (Signed)
Pt called stating Dr. Gala Romney has not changed her RX but she went to pharmacy and her Rx dosage has changed. Please advise [phone]

## 2012-10-05 ENCOUNTER — Other Ambulatory Visit: Payer: Self-pay

## 2012-10-05 DIAGNOSIS — K50813 Crohn's disease of both small and large intestine with fistula: Secondary | ICD-10-CM

## 2012-10-05 DIAGNOSIS — D539 Nutritional anemia, unspecified: Secondary | ICD-10-CM

## 2012-10-05 MED ORDER — AZATHIOPRINE 50 MG PO TABS: 125.0000 mg | ORAL_TABLET | Freq: Every day | ORAL | Status: DC

## 2012-10-05 NOTE — Telephone Encounter (Signed)
Tried to call pt- LMOM. Tried to call [facility]- no one answered the phone.

## 2012-10-05 NOTE — Telephone Encounter (Signed)
Spoke with pt- wrong dosage for Imuran was sent in. Corrected rx and sent it to the pharmacy.

## 2012-12-04 LAB — CBC WITH DIFFERENTIAL/PLATELET
Basophils Relative: 1 % (ref 0–1)
Eosinophils Absolute: 0.1 10*3/uL (ref 0.0–0.7)
Hemoglobin: 13 g/dL (ref 12.0–15.0)
MCH: 34.2 pg — ABNORMAL HIGH (ref 26.0–34.0)
MCHC: 35.8 g/dL (ref 30.0–36.0)
Monocytes Relative: 6 % (ref 3–12)
Neutrophils Relative %: 63 % (ref 43–77)
RDW: 14.1 % (ref 11.5–15.5)

## 2012-12-04 LAB — HEPATIC FUNCTION PANEL
ALT: 16 U/L (ref 0–35)
AST: 22 U/L (ref 0–37)
Albumin: 4.4 g/dL (ref 3.5–5.2)
Total Bilirubin: 1.7 mg/dL — ABNORMAL HIGH (ref 0.3–1.2)

## 2012-12-07 ENCOUNTER — Other Ambulatory Visit: Payer: Self-pay | Admitting: Internal Medicine

## 2012-12-07 ENCOUNTER — Telehealth: Payer: Self-pay | Admitting: Internal Medicine

## 2012-12-07 NOTE — Telephone Encounter (Signed)
Message copied by Feliberto Gottron on Mon Dec 07, 2012 10:33 AM ------      Message from: Rosanne Sack      Created: Wed Dec 02, 2012  4:07 PM       Pt is on the re-call list from Nov for a DEXA SCAN            Thanks      Ginger ------

## 2012-12-07 NOTE — Telephone Encounter (Signed)
Letter has been mailed to patient

## 2012-12-08 ENCOUNTER — Other Ambulatory Visit: Payer: Self-pay

## 2012-12-08 ENCOUNTER — Other Ambulatory Visit: Payer: Self-pay | Admitting: Internal Medicine

## 2012-12-08 DIAGNOSIS — R7989 Other specified abnormal findings of blood chemistry: Secondary | ICD-10-CM

## 2012-12-08 LAB — CBC WITH DIFFERENTIAL/PLATELET
Basophils Absolute: 0 10*3/uL (ref 0.0–0.1)
HCT: 35.4 % — ABNORMAL LOW (ref 36.0–46.0)
Hemoglobin: 12.4 g/dL (ref 12.0–15.0)
Lymphocytes Relative: 26 % (ref 12–46)
Monocytes Absolute: 0.6 10*3/uL (ref 0.1–1.0)
Neutro Abs: 4 10*3/uL (ref 1.7–7.7)
RDW: 13.4 % (ref 11.5–15.5)
WBC: 6.3 10*3/uL (ref 4.0–10.5)

## 2012-12-10 ENCOUNTER — Other Ambulatory Visit: Payer: Self-pay

## 2012-12-10 DIAGNOSIS — D539 Nutritional anemia, unspecified: Secondary | ICD-10-CM

## 2013-02-01 ENCOUNTER — Other Ambulatory Visit: Payer: Self-pay

## 2013-02-01 DIAGNOSIS — D539 Nutritional anemia, unspecified: Secondary | ICD-10-CM

## 2013-02-08 ENCOUNTER — Telehealth: Payer: Self-pay | Admitting: Internal Medicine

## 2013-02-08 NOTE — Telephone Encounter (Signed)
Spoke with pt- she had 24 hour stomach "bug" last week which included N/V and diarrhea. Per pt- diarrhea is better and the vomiting is gone but she still has really bad nausea. Pt is drinking gatorade and on brat diet. No fever. She is able to keep down pentasa and imuran. She is concerned d/t her crohns and she knows it takes her longer to get over things like this. She is aware that RMR is on vacation and LSL has already left for the day and that I was sending this to [facility].  She is requesting zofran or phenergan sent to [place].   Also advised her that if she has problems d/t crohns and she feels like she needs to go to ED, she should go to [facility].

## 2013-02-08 NOTE — Telephone Encounter (Signed)
Pt called to speak with someone about multiple problems she has been having over the past 3 days. Diarrhea and vomiting. I don't have any available openings until Jan 26 with the extenders. Patient thinks she needs to go to the ED and asked if she should go to [facility] or [facility]. I told her that was up to her which one she should go to. She asked if someone would call her back today. I told her that I would let the nurse be aware of her concerns. She said that the past few times that we have told her that someone would call her back she had to wait for days. I told her that I would let the nurse be aware. [phone]

## 2013-02-10 MED ORDER — PROMETHAZINE HCL 12.5 MG PO TABS: ORAL_TABLET | ORAL | Status: DC

## 2013-02-10 NOTE — Addendum Note (Signed)
Addended by: Danie Binder on: 02/10/2013 12:54 PM   Modules accepted: Orders

## 2013-02-10 NOTE — Telephone Encounter (Signed)
Pt is aware.  

## 2013-02-10 NOTE — Telephone Encounter (Signed)
PLEASE CALL PT.  Rx sent FOR PHENERGAN.

## 2013-02-26 ENCOUNTER — Telehealth: Payer: Self-pay | Admitting: Internal Medicine

## 2013-02-26 NOTE — Telephone Encounter (Signed)
Letter and ifobt has been mailed to pt.

## 2013-02-26 NOTE — Telephone Encounter (Signed)
Pt is on Feb recall to follow up with RMR. She had LMOM that she needed an appointment with RMR. I have made the [place] for 03/26/13 at 9am with RMR. Notes in recall said that she needed occult blood testing just before next OV per RMR. [phone]

## 2013-03-23 LAB — CBC WITH DIFFERENTIAL/PLATELET
Basophils Absolute: 0 10*3/uL (ref 0.0–0.1)
Basophils Relative: 0 % (ref 0–1)
Eosinophils Absolute: 0.1 10*3/uL (ref 0.0–0.7)
Eosinophils Relative: 1 % (ref 0–5)
HCT: 39.7 % (ref 36.0–46.0)
Hemoglobin: 13.6 g/dL (ref 12.0–15.0)
Lymphocytes Relative: 22 % (ref 12–46)
Lymphs Abs: 1.4 10*3/uL (ref 0.7–4.0)
MCH: 33.3 pg (ref 26.0–34.0)
MCHC: 34.3 g/dL (ref 30.0–36.0)
MCV: 97.3 fL (ref 78.0–100.0)
Monocytes Absolute: 0.5 10*3/uL (ref 0.1–1.0)
Monocytes Relative: 9 % (ref 3–12)
Neutro Abs: 4 10*3/uL (ref 1.7–7.7)
Neutrophils Relative %: 68 % (ref 43–77)
Platelets: 236 10*3/uL (ref 150–400)
RBC: 4.08 MIL/uL (ref 3.87–5.11)
RDW: 14 % (ref 11.5–15.5)
WBC: 6 10*3/uL (ref 4.0–10.5)

## 2013-03-26 ENCOUNTER — Ambulatory Visit (INDEPENDENT_AMBULATORY_CARE_PROVIDER_SITE_OTHER): Payer: Medicare Other | Admitting: Internal Medicine

## 2013-03-26 ENCOUNTER — Other Ambulatory Visit: Payer: Self-pay | Admitting: Internal Medicine

## 2013-03-26 ENCOUNTER — Encounter: Payer: Self-pay | Admitting: Internal Medicine

## 2013-03-26 ENCOUNTER — Encounter (INDEPENDENT_AMBULATORY_CARE_PROVIDER_SITE_OTHER): Payer: Self-pay

## 2013-03-26 VITALS — BP 121/74 | HR 78 | Temp 97.5°F | Wt 198.0 lb

## 2013-03-26 DIAGNOSIS — K5080 Crohn's disease of both small and large intestine without complications: Secondary | ICD-10-CM

## 2013-03-26 DIAGNOSIS — K5000 Crohn's disease of small intestine without complications: Secondary | ICD-10-CM

## 2013-03-26 DIAGNOSIS — K6289 Other specified diseases of anus and rectum: Secondary | ICD-10-CM

## 2013-03-26 LAB — IFOBT (OCCULT BLOOD): IFOBT: NEGATIVE

## 2013-03-26 NOTE — Progress Notes (Signed)
Primary Care Physician:  Monico Blitz, MD Primary Gastroenterologist:  Dr. Gala Romney  Pre-Procedure History & Physical: HPI:  Sheri [name] is a 63 y.o. female here for evaluation of right side buttock/rectal/perineal pain. Comes and goes intermittently. Sometimes when she's sitting down. Not necessarily associated with bowel function. Has had symptoms for 5 months has not been evaluated elsewhere. Hasn't had any evidence of fistula, stooling to vagina, etc. No abdominal  pain. Occasional diarrhea. Continues to tolerate Pentasa and Imuran very well. Also takes florastor which he likes very much.  Intermittent nausea comes at any time of day or night. Sometimes has to lay down to get symptoms to go away. No vertiginous symptoms. Zofran helps. Typical reflux symptoms controlled with omeprazole 40 mg daily;  No vomiting or dysphagia. Has come off Fosamax. Normal bone density study 2012. Continues on B12 supplementation  She has a history of a back abscess osteomyelitis previously. Weight is actually down 9 pounds since her August visit here. Active ileocolic current 2979 TCS. Noncritical Schatzki's ring and small hiatal hernia 2011 EGD. Small bowel biopsies negative for concomitant celiac disease.  She's taking no nonsteroidal agents.  Past Medical History  Diagnosis Date  . Crohn disease   . Anemia   . Low back pain   . Fibromyalgia   . Osteopenia     Last DEXA 12/2010 was normal, on fosamax    Past Surgical History  Procedure Laterality Date  . Small intestine surgery  [id number]  . Cesarean section  [id number]  . Appendectomy  1990  . Drainage of back abscess  2007  . Colonoscopy  09/11/2006  . Esophagogastroduodenoscopy  04/2009    noncritical schatzki's ring, small hh, sb bx negative  . Colonoscopy  10/30/2010    RMR: External and internal hemorrhoids, likely source of hematochezia  otherwise normal rectum/ Left-sided diverticula, status post prior right hemicolectomy with a friable,  inflamed, stenotic surgical anastomosis with upstream dilation of the neoterminal ileum/ Crohn's noted, status post biopsy    Prior to Admission medications   Medication Sig Start Date End Date Taking? Authorizing Provider  ALPRAZolam Duanne Moron) 1 MG tablet Take 1 tablet (1 mg total) by mouth 2 (two) times daily. Take one tablet by mouth two times a day 06/19/10  Yes Mahala Menghini, PA-C  azaTHIOprine (IMURAN) 50 MG tablet Take 2.5 tablets (125 mg total) by mouth daily. 10/05/12  Yes Daneil Dolin, MD  baclofen (LIORESAL) 10 MG tablet Take 10 mg by mouth 3 (three) times daily.   Yes Historical Provider, MD  Calcium Carbonate-Vitamin D (CALCIUM 600+D PO) Take by mouth 2 (two) times daily.     Yes Historical Provider, MD  Cyanocobalamin (VITAMIN B-12 IJ) Inject as directed every 30 (thirty) days.     Yes Historical Provider, MD  cyclobenzaprine (FLEXERIL) 10 MG tablet Take 10 mg by mouth as needed.    Yes Historical Provider, MD  fish oil-omega-3 fatty acids 1000 MG capsule Take 2 g by mouth daily.     Yes Historical Provider, MD  hyoscyamine (LEVSIN SL) 0.125 MG SL tablet Place 1 tablet (0.125 mg total) under the tongue 4 (four) times daily -  before meals and at bedtime. 09/18/12  Yes Daneil Dolin, MD  lisinopril-hydrochlorothiazide (PRINZIDE,ZESTORETIC) 20-12.5 MG per tablet Take 1 tablet by mouth daily. 06/19/10  Yes Mahala Menghini, PA-C  Loratadine 10 MG CAPS Take by mouth daily.     Yes Historical Provider, MD  multivitamin [facility]) per tablet Take 1 tablet  by mouth daily.     Yes Historical Provider, MD  omeprazole (PRILOSEC) 20 MG capsule Take 20 mg by mouth daily.     Yes Historical Provider, MD  ondansetron (ZOFRAN) 4 MG tablet Take 4 mg by mouth. ONE BY MOUTH EVERY 4-6 HOURS AS NEEDED FOR NAUSEA AND VOMITING    Yes Historical Provider, MD  oxyCODONE (ROXICODONE) 15 MG immediate release tablet Take 15 mg by mouth every 4 (four) hours as needed.  11/12/10  Yes Historical Provider, MD  PENTASA  500 MG CR capsule TAKE (2) CAPSULES FOUR TIMES DAILY. 08/12/12  Yes Mahala Menghini, PA-C  Probiotic Product (RESTORA) CAPS Take 1 capsule by mouth daily. 01/09/11  Yes Mahala Menghini, PA-C  promethazine (PHENERGAN) 12.5 MG tablet 1-2 po 6 h prn nausea or vomiting 02/10/13  Yes Danie Binder, MD  vitamin E 1000 UNIT capsule Take 1,000 Units by mouth daily.     Yes Historical Provider, MD  zinc gluconate 50 MG tablet Take 50 mg by mouth daily.     Yes Historical Provider, MD  alendronate (FOSAMAX) 70 MG tablet Take 1 tablet (70 mg total) by mouth once a week. Take with a full glass of water on an empty stomach. 06/19/10   Mahala Menghini, PA-C  azaTHIOprine (IMURAN) 50 MG tablet Take 1 tablet (50 mg total) by mouth daily. 09/18/12   Daneil Dolin, MD  citalopram (CELEXA) 20 MG tablet Take 1 tablet (20 mg total) by mouth 2 (two) times daily. 06/19/10   Mahala Menghini, PA-C    Allergies as of 03/26/2013 - Review Complete 03/26/2013  Allergen Reaction Noted  . Codeine sulfate      Family History  Problem Relation Age of Onset  . Crohn's disease Mother     succumbed to stomach cancer in her 65s  . Colon cancer Neg Hx     History   Social History  . Marital Status: Legally Separated    Spouse Name: N/A    Number of Children: 2  . Years of Education: N/A   Occupational History  . disability    Social History Main Topics  . Smoking status: Never Smoker   . Smokeless tobacco: Not on file  . Alcohol Use: Yes     Comment: frozen drinks here and there  . Drug Use: No  . Sexual Activity: Not on file   Other Topics Concern  . Not on file   Social History Narrative  . No narrative on file    Review of Systems: See HPI, otherwise negative ROS  Physical Exam: BP 121/74  Pulse 78  Temp(Src) 97.5 F (36.4 C) (Oral)  Wt 198 lb (89.812 kg) General:   Alert,  Well-developed, well-nourished, pleasant and cooperative in NAD Skin:  Intact without significant lesions or rashes. Eyes:  Sclera  clear, no icterus.   Conjunctiva pink. Ears:  Normal auditory acuity. Nose:  No deformity, discharge,  or lesions. Mouth:  No deformity or lesions. Neck:  Supple; no masses or thyromegaly. No significant cervical adenopathy. Lungs:  Clear throughout to auscultation.   No wheezes, crackles, or rhonchi. No acute distress. Heart:  Regular rate and rhythm; no murmurs, clicks, rubs,  or gallops. Abdomen: Non-distended, normal bowel sounds.  Soft and nontender without appreciable mass or hepatosplenomegaly.  Pulses:  Normal pulses noted. Extremities:  Without clubbing or edema. Rectal/buttocks examination:  I do not see any skin abnormalities I do not see a fistula tract. Has a small  external hemorrhoid tag. Palpation of her buttocks reveals some tenderness over the right ischial tuberosity. Digital exam reveals no abnormalities. No mass. Scant brown stool Hemoccult negative.   Impression:    Pleasant 63 year old lady with well established ileocolonic Crohn's disease, GERD in remission on Imuran, Pentasa and florastar. Having some perirectal discomfort which is somewhat strange. I really don't find much objectively on physical examination to suggest fistula or abscess. 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  She has intermittent nausea which is fleeting but responsive to anti-emetic therapy. No emesis.  This is a nonspecific symptom.  History of mildly elevated bilirubin of uncertain significance. Gallladder looked okay a few years ago: Ultrasound. No dilation of the biliary tree. Fatty liver.   Recommendations:   CTE/abdomen pelvis to evaluate for perirectal abscess, fistula or other abnormality which may be causing her somewhat peculiar symptoms of pain.  Hold omeprazole for now; try 2 week course of Dexilant 60 mg daily to see it has any effect on her nausea symptoms.  Chem-12  and CBC today.  Further recommendations to follow.

## 2013-03-26 NOTE — Progress Notes (Signed)
Pt return IFOBT test and it was NEGATIVE.

## 2013-03-26 NOTE — Patient Instructions (Signed)
CTE abdomen and pelvis  (right-sided perirectal pain)- evaluate for occult perirectal abscess, fistula, hx of Crohns  Chem-12 and CBC  2 week trial of Dexilant 60 mg daily; hold omeprazole during this time

## 2013-04-01 ENCOUNTER — Ambulatory Visit (HOSPITAL_COMMUNITY): Payer: Medicare Other

## 2013-04-02 ENCOUNTER — Other Ambulatory Visit (HOSPITAL_COMMUNITY): Payer: Medicare Other

## 2013-04-06 ENCOUNTER — Ambulatory Visit (HOSPITAL_COMMUNITY)
Admission: RE | Admit: 2013-04-06 | Discharge: 2013-04-06 | Disposition: A | Discharge status: Home / Self Care | Payer: Medicare Other | Source: Ambulatory Visit | Attending: Internal Medicine | Admitting: Internal Medicine

## 2013-04-06 DIAGNOSIS — Z8719 Personal history of other diseases of the digestive system: Secondary | ICD-10-CM | POA: Insufficient documentation

## 2013-04-06 DIAGNOSIS — K6289 Other specified diseases of anus and rectum: Secondary | ICD-10-CM | POA: Insufficient documentation

## 2013-04-06 DIAGNOSIS — Z9049 Acquired absence of other specified parts of digestive tract: Secondary | ICD-10-CM | POA: Insufficient documentation

## 2013-04-06 LAB — COMPREHENSIVE METABOLIC PANEL
ALK PHOS: 79 U/L (ref 39–117)
ALT: 12 U/L (ref 0–35)
AST: 16 U/L (ref 0–37)
Albumin: 4.4 g/dL (ref 3.5–5.2)
BILIRUBIN TOTAL: 2.4 mg/dL — AB (ref 0.2–1.2)
BUN: 9 mg/dL (ref 6–23)
CO2: 25 mEq/L (ref 19–32)
CREATININE: 0.62 mg/dL (ref 0.50–1.10)
Calcium: 9.6 mg/dL (ref 8.4–10.5)
Chloride: 101 mEq/L (ref 96–112)
Glucose, Bld: 111 mg/dL — ABNORMAL HIGH (ref 70–99)
Potassium: 3.6 mEq/L (ref 3.5–5.3)
Sodium: 137 mEq/L (ref 135–145)
Total Protein: 6.9 g/dL (ref 6.0–8.3)

## 2013-04-06 LAB — POCT I-STAT CREATININE: Creatinine, Ser: 0.8 mg/dL (ref 0.50–1.10)

## 2013-04-06 MED ORDER — IOHEXOL 300 MG/ML  SOLN
125.0000 mL | Freq: Once | INTRAMUSCULAR | Status: AC | PRN
  Administered 2013-04-06: 125 mL via INTRAVENOUS

## 2013-04-16 ENCOUNTER — Telehealth: Payer: Self-pay

## 2013-04-16 NOTE — Telephone Encounter (Signed)
Message copied by Rosanne Sack on Fri Apr 16, 2013 11:47 AM ------      Message from: Daneil Dolin      Created: Fri Apr 16, 2013 10:44 AM       Yes, needs an MRI of the pelvis/perirectal area to evaluate CTE abnormality further      ----- Message -----         From: Rosanne Sack, CMA         Sent: 04/14/2013   2:59 PM           To: Daneil Dolin, MD            Pt is calling for her CT scan results. I see where you addressed it but I can not tell if you send it to anyone. You put in there that she might need a MRI, so I am trying to follow up on that. Please advise                   Thanks      Ginger             ------

## 2013-04-16 NOTE — Telephone Encounter (Signed)
Sheri [name]  Can you please schedule her for this Monday when you come in.   Thanks

## 2013-04-19 ENCOUNTER — Other Ambulatory Visit: Payer: Self-pay | Admitting: Internal Medicine

## 2013-04-19 DIAGNOSIS — K604 Rectal fistula: Secondary | ICD-10-CM

## 2013-04-19 NOTE — Telephone Encounter (Signed)
Mri Is scheduled for Friday March 13th at 11:00 and Sheri [name] is aware

## 2013-04-23 ENCOUNTER — Ambulatory Visit (HOSPITAL_COMMUNITY)
Admission: RE | Admit: 2013-04-23 | Discharge: 2013-04-23 | Disposition: A | Discharge status: Home / Self Care | Payer: Medicare Other | Source: Ambulatory Visit | Attending: Internal Medicine | Admitting: Internal Medicine

## 2013-04-23 ENCOUNTER — Other Ambulatory Visit (HOSPITAL_COMMUNITY): Payer: Medicare Other

## 2013-04-23 ENCOUNTER — Encounter (HOSPITAL_COMMUNITY): Payer: Self-pay

## 2013-04-23 DIAGNOSIS — K5090 Crohn's disease, unspecified, without complications: Secondary | ICD-10-CM | POA: Insufficient documentation

## 2013-04-23 DIAGNOSIS — N949 Unspecified condition associated with female genital organs and menstrual cycle: Secondary | ICD-10-CM | POA: Insufficient documentation

## 2013-04-23 DIAGNOSIS — K6289 Other specified diseases of anus and rectum: Secondary | ICD-10-CM | POA: Insufficient documentation

## 2013-04-23 DIAGNOSIS — K604 Rectal fistula: Secondary | ICD-10-CM

## 2013-04-23 MED ORDER — GADOBENATE DIMEGLUMINE 529 MG/ML IV SOLN
18.0000 mL | Freq: Once | INTRAVENOUS | Status: AC | PRN
  Administered 2013-04-23: 18 mL via INTRAVENOUS

## 2013-04-29 ENCOUNTER — Telehealth: Payer: Self-pay | Admitting: Internal Medicine

## 2013-04-29 NOTE — Telephone Encounter (Signed)
Pt called this afternoon to say that she had left message last Thursday for JL to call her regarding her Pentasa prior aurthorization. She said sometimes we would go ahead and take care of it and other times we would call her back to let her know. She said that her pharmacy hasn't received anything from [place] and I asked if she called her insurance company and she said yes, that they haven't heard from [place] either. She is needing her medicines and would like for someone to touch base with her about when she should be able to get it. She called back a second time to give [place] a number [phone] with member ID # [id number]. She was told if we called that number with member ID # that they would fill her prescription immediately. Please advise and call patient ASAP at [phone]

## 2013-04-29 NOTE — Telephone Encounter (Signed)
I called the pt and LMOM that Almyra Free handles the PA's. Not sure where it all stands and all of the info. I do have some samples of Pentasa that she can pick up if she would like me to leave some at the front desk for her.

## 2013-05-03 ENCOUNTER — Telehealth: Payer: Self-pay | Admitting: *Deleted

## 2013-05-03 NOTE — Telephone Encounter (Signed)
Pt called to get the results of her CT scan, PT also has questions about her medication. Please advise [phone].

## 2013-05-03 NOTE — Telephone Encounter (Signed)
Pt is aware of results from MRI and her follow up with AS

## 2013-05-07 ENCOUNTER — Other Ambulatory Visit: Payer: Self-pay | Admitting: Internal Medicine

## 2013-05-11 NOTE — Telephone Encounter (Signed)
I talked with [name] today and she was upset because she is still having to wait for the Pa on her Pentasa. I called her insurance company today to start the PA for her Pentasa. They said it could take up to 2-3 days to hear anything. [name] is aware of this.

## 2013-05-13 ENCOUNTER — Telehealth: Payer: Self-pay | Admitting: *Deleted

## 2013-05-13 NOTE — Telephone Encounter (Signed)
Pt called wanting to let Ginger know she got to pick up her RX from [facility] yesterday, Pt stated Ginger has been working on a prior authorization with her pharmacy for a while now, pt also wanted to let Ginger know she appreciates everything she has tried to do to get her authorization right.

## 2013-05-26 ENCOUNTER — Telehealth: Payer: Self-pay

## 2013-05-26 NOTE — Telephone Encounter (Signed)
Pt had to cancel her appointment for Thursday because she might have the shingles. She would like to follow up to see what elas can be done about the pain in her right side. Please advise

## 2013-05-27 ENCOUNTER — Ambulatory Visit: Payer: Medicare Other | Admitting: Gastroenterology

## 2013-05-27 NOTE — Telephone Encounter (Signed)
Recommend discussing with Dr. Gala Romney who saw her recently.

## 2013-06-14 NOTE — Telephone Encounter (Signed)
Route to RMR

## 2013-07-23 NOTE — Telephone Encounter (Signed)
Sheri [name], can you make appt.

## 2013-07-23 NOTE — Telephone Encounter (Signed)
Would be best to make an appointment with extender in the next week or so

## 2013-07-25 NOTE — Telephone Encounter (Signed)
Next available with extenders will be Mid July. Can we use an URG spot or use next available?

## 2013-08-04 ENCOUNTER — Telehealth: Payer: Self-pay | Admitting: Internal Medicine

## 2013-08-04 NOTE — Telephone Encounter (Signed)
Can we use an URG spot or next available? Please advise.

## 2013-08-04 NOTE — Telephone Encounter (Signed)
Theadora Rama at 07/25/2013  8:08 AM      Status: Signed            Next available with extenders will be Mid July. Can we use an URG spot or use next available?         Marylou Mccoy, LPN at 07/22/6433  3:91 AM      Status: Signed            Manuela Schwartz, can you make appt.         Daneil Dolin, MD at 07/23/2013  9:19 AM      Status: Signed            Would be best to make an appointment with extender in the next week or so

## 2013-08-05 ENCOUNTER — Other Ambulatory Visit: Payer: Self-pay | Admitting: Gastroenterology

## 2013-08-06 NOTE — Telephone Encounter (Signed)
Use an urgent spot.

## 2013-08-09 NOTE — Telephone Encounter (Signed)
Pt is scheduled for 08/11/13 with Laban Emperor and pt is aware.

## 2013-08-11 ENCOUNTER — Ambulatory Visit (INDEPENDENT_AMBULATORY_CARE_PROVIDER_SITE_OTHER): Payer: Medicare Other | Admitting: Gastroenterology

## 2013-08-11 ENCOUNTER — Encounter (INDEPENDENT_AMBULATORY_CARE_PROVIDER_SITE_OTHER): Payer: Self-pay

## 2013-08-11 ENCOUNTER — Encounter: Payer: Self-pay | Admitting: Gastroenterology

## 2013-08-11 VITALS — BP 127/78 | HR 92 | Temp 99.3°F | Resp 18 | Ht 70.0 in | Wt 195.0 lb

## 2013-08-11 DIAGNOSIS — K5080 Crohn's disease of both small and large intestine without complications: Secondary | ICD-10-CM

## 2013-08-11 LAB — CBC WITH DIFFERENTIAL/PLATELET
BASOS ABS: 0 10*3/uL (ref 0.0–0.1)
Basophils Relative: 0 % (ref 0–1)
Eosinophils Absolute: 0.1 10*3/uL (ref 0.0–0.7)
Eosinophils Relative: 1 % (ref 0–5)
HEMATOCRIT: 34.7 % — AB (ref 36.0–46.0)
Hemoglobin: 12.1 g/dL (ref 12.0–15.0)
LYMPHS PCT: 27 % (ref 12–46)
Lymphs Abs: 1.7 10*3/uL (ref 0.7–4.0)
MCH: 33.5 pg (ref 26.0–34.0)
MCHC: 34.9 g/dL (ref 30.0–36.0)
MCV: 96.1 fL (ref 78.0–100.0)
MONO ABS: 0.6 10*3/uL (ref 0.1–1.0)
Monocytes Relative: 9 % (ref 3–12)
NEUTROS ABS: 4 10*3/uL (ref 1.7–7.7)
Neutrophils Relative %: 63 % (ref 43–77)
PLATELETS: 240 10*3/uL (ref 150–400)
RBC: 3.61 MIL/uL — AB (ref 3.87–5.11)
RDW: 15.1 % (ref 11.5–15.5)
WBC: 6.3 10*3/uL (ref 4.0–10.5)

## 2013-08-11 NOTE — Patient Instructions (Signed)
Please complete the blood work. We will call with the results.  We will see you in 3 months!  I will be speaking with Dr. Gala Romney about further treatment plans.

## 2013-08-11 NOTE — Progress Notes (Signed)
Referring Provider: Monico Blitz, MD Primary Care Physician:  Monico Blitz, MD Primary GI: Dr. Gala Romney   Chief Complaint  Patient presents with  . 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 Interesting symptoms of perirectal discomfort over past several months, with CTE and subsequent MRI raising question of possible fistula in the past but no current inflammatory issues or active fistula/abscess. Notes intermittent RLQ discomfort. Pelvic pain intermittently. Thought at one point may have shingles. Spinal injections in neck and lower back. Most recent injections were done in a lower area of spine. Noted resolution of rectal discomfort with this. Now with recurrent discomfort but not as bad as it was. Not enough to call in and complain about it. Sometimes will have BM 4 times a day, sometimes 8 times a day. Baseline for her. No rectal bleeding. Still with the perirectal discomfort that is moreso in the right pelvic area. Fleeting. Comes and goes. 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 Sometimes feels like it may be swollen a little. Wonders if she should increase Imuran dosing. Has failed Humira in past.   Past Medical History  Diagnosis Date  . Crohn disease   . Anemia   . Low back pain   . Fibromyalgia   . Osteopenia     Last DEXA 12/2010 was normal, on fosamax    Past Surgical History  Procedure Laterality Date  . Small intestine surgery  [id number]  . Cesarean section  [id number]  . Appendectomy  1990  . Drainage of back abscess  2007  . Colonoscopy  09/11/2006  . Esophagogastroduodenoscopy  04/2009    noncritical schatzki's ring, small hh, sb bx negative  . Colonoscopy  10/30/2010    RMR: External and internal hemorrhoids, likely source of hematochezia  otherwise normal rectum/ Left-sided diverticula, status post prior right hemicolectomy with a friable, inflamed, stenotic surgical anastomosis with upstream  dilation of the neoterminal ileum/ Crohn's noted, status post biopsy    Current Outpatient Prescriptions  Medication Sig Dispense Refill  . azaTHIOprine (IMURAN) 50 MG tablet Take 50 mg by mouth daily. Take 2.5 tablets daily, total of 125 mg daily.      . baclofen (LIORESAL) 10 MG tablet Take 10 mg by mouth 3 (three) times daily.      . Calcium Carbonate-Vitamin D (CALCIUM 600+D PO) Take by mouth 2 (two) times daily.        . citalopram (CELEXA) 20 MG tablet Take 1 tablet (20 mg total) by mouth 2 (two) times daily.  60 tablet  0  . Cyanocobalamin (VITAMIN B-12 IJ) Inject as directed every 30 (thirty) days.        . fish oil-omega-3 fatty acids 1000 MG capsule Take 2 g by mouth daily.        . hyoscyamine (LEVSIN SL) 0.125 MG SL tablet Place 1 tablet (0.125 mg total) under the tongue 4 (four) times daily -  before meals and at bedtime.  60 tablet  3  . lisinopril-hydrochlorothiazide (PRINZIDE,ZESTORETIC) 20-12.5 MG per tablet Take 1 tablet by mouth daily.  30 tablet  0  . Loratadine 10 MG CAPS Take by mouth daily.        . multivitamin (THERAGRAN) per tablet Take 1 tablet by mouth daily.        Marland Kitchen omeprazole (PRILOSEC) 20 MG capsule Take 20 mg by mouth daily.        Marland Kitchen  oxyCODONE (ROXICODONE) 15 MG immediate release tablet Take 15 mg by mouth every 4 (four) hours as needed.       Marland Kitchen PENTASA 500 MG CR capsule TAKE (2) CAPSULES FOUR TIMES DAILY.  240 capsule  11  . Probiotic Product (RESTORA) CAPS Take 1 capsule by mouth daily.  28 capsule  0  . promethazine (PHENERGAN) 12.5 MG tablet 1-2 po 6 h prn nausea or vomiting  40 tablet  0  . vitamin E 1000 UNIT capsule Take 1,000 Units by mouth daily.        Marland Kitchen zinc gluconate 50 MG tablet Take 50 mg by mouth daily.        Marland Kitchen alendronate (FOSAMAX) 70 MG tablet Take 1 tablet (70 mg total) by mouth once a week. Take with a full glass of water on an empty stomach.  4 tablet  0  . ALPRAZolam (XANAX) 1 MG tablet Take 1 tablet (1 mg total) by mouth 2 (two) times  daily. Take one tablet by mouth two times a day  60 tablet  0  . cyclobenzaprine (FLEXERIL) 10 MG tablet Take 10 mg by mouth as needed.       . ondansetron (ZOFRAN) 4 MG tablet Take 4 mg by mouth. ONE BY MOUTH EVERY 4-6 HOURS AS NEEDED FOR NAUSEA AND VOMITING        No current facility-administered medications for this visit.    Allergies as of 08/11/2013 - Review Complete 08/11/2013  Allergen Reaction Noted  . Codeine sulfate      Family History  Problem Relation Age of Onset  . Crohn's disease Mother     succumbed to stomach cancer in her 17s  . Colon cancer Neg Hx     History   Social History  . Marital Status: Legally Separated    Spouse Name: N/A    Number of Children: 2  . Years of Education: N/A   Occupational History  . disability    Social History Main Topics  . Smoking status: Never Smoker   . Smokeless tobacco: None  . Alcohol Use: Yes     Comment: frozen drinks here and there  . Drug Use: No  . Sexual Activity: None   Other Topics Concern  . None   Social History Narrative  . None    Review of Systems: As mentioned in HPI  Physical Exam: BP 127/78  Pulse 92  Temp(Src) 99.3 F (37.4 C) (Oral)  Resp 18  Ht 5' 10"  (1.778 m)  Wt 195 lb (88.451 kg)  BMI 27.98 kg/m2 General:   Alert and oriented. No distress noted. Pleasant and cooperative.  Head:  Normocephalic and atraumatic. Eyes:  Conjuctiva clear without scleral icterus. Abdomen:  +BS, soft, non-tender and non-distended. No rebound or guarding. No HSM or masses noted. Msk:  Symmetrical without gross deformities. Normal posture. Extremities:  Without edema. Neurologic:  Alert and  oriented x4;  grossly normal neurologically. Skin:  Intact without significant lesions or rashes. Psych:  Alert and cooperative. Normal mood and affect.  Assessment/Plan: 63 year old female with ileocolonic Crohn's disease, doing overall quite well on Imuran 125 mg daily and Pentasa. Perirectal discomfort  thoroughly evaluated with CTE and MRI without obvious abnormality. Interestingly, she notes improvement in pain after spinal injections. Question neuropathic in nature. Patient questioning increasing Imuran; she does continue with 4-8 stools per day, which is her baseline. As no evidence of acute issues or flares, will leave medication dosing as is currently. Obtain CBC/CMP now. Will  discuss with Dr. Gala Romney potential of increasing in future if needed. For now, no change in regimen. Return in 3 months.

## 2013-08-12 LAB — COMPREHENSIVE METABOLIC PANEL
ALBUMIN: 3.9 g/dL (ref 3.5–5.2)
ALT: 17 U/L (ref 0–35)
AST: 18 U/L (ref 0–37)
Alkaline Phosphatase: 81 U/L (ref 39–117)
BUN: 12 mg/dL (ref 6–23)
CHLORIDE: 103 meq/L (ref 96–112)
CO2: 28 meq/L (ref 19–32)
Calcium: 9.5 mg/dL (ref 8.4–10.5)
Creat: 0.54 mg/dL (ref 0.50–1.10)
Glucose, Bld: 95 mg/dL (ref 70–99)
POTASSIUM: 3.7 meq/L (ref 3.5–5.3)
Sodium: 140 mEq/L (ref 135–145)
Total Bilirubin: 1.7 mg/dL — ABNORMAL HIGH (ref 0.2–1.2)
Total Protein: 6.5 g/dL (ref 6.0–8.3)

## 2013-08-12 NOTE — Progress Notes (Signed)
Cc to PCP 

## 2013-08-17 NOTE — Progress Notes (Signed)
Quick Note:  Non-specific elevation of bilirubin, chronic. Hgb 12.1 Labs overall stable. ______

## 2013-10-08 ENCOUNTER — Other Ambulatory Visit: Payer: Self-pay | Admitting: Gastroenterology

## 2013-10-14 ENCOUNTER — Encounter: Payer: Self-pay | Admitting: Internal Medicine

## 2013-11-30 ENCOUNTER — Ambulatory Visit (INDEPENDENT_AMBULATORY_CARE_PROVIDER_SITE_OTHER): Payer: Medicare Other | Admitting: Gastroenterology

## 2013-11-30 ENCOUNTER — Encounter: Payer: Self-pay | Admitting: Gastroenterology

## 2013-11-30 ENCOUNTER — Encounter (INDEPENDENT_AMBULATORY_CARE_PROVIDER_SITE_OTHER): Payer: Self-pay

## 2013-11-30 VITALS — BP 118/70 | HR 87 | Temp 98.5°F | Resp 18 | Ht 70.0 in | Wt 201.0 lb

## 2013-11-30 DIAGNOSIS — K50813 Crohn's disease of both small and large intestine with fistula: Secondary | ICD-10-CM

## 2013-11-30 NOTE — Progress Notes (Signed)
Primary Care Physician:  Monico Blitz, MD Primary GI: Dr. Gala Romney   Chief Complaint  Patient presents with  . 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 Interesting symptoms of perirectal discomfort in past, with CTE and subsequent MRI raising question of possible fistula in the past but no current inflammatory issues or active fistula/abscess.  At baseline for bowel habits. 3-5 stools per day, which is her normal. No bad spells. No rectal bleeding. Perirectal discomfort much improved after lower spine injections.     Past Medical History  Diagnosis Date  . Crohn disease   . Anemia   . Low back pain   . Fibromyalgia   . Osteopenia     Last DEXA 12/2010 was normal, on fosamax    Past Surgical History  Procedure Laterality Date  . Small intestine surgery  [id number]  . Cesarean section  [id number]  . Appendectomy  1990  . Drainage of back abscess  2007  . Colonoscopy  09/11/2006  . Esophagogastroduodenoscopy  04/2009    noncritical schatzki's ring, small hh, sb bx negative  . Colonoscopy  10/30/2010    RMR: External and internal hemorrhoids, likely source of hematochezia  otherwise normal rectum/ Left-sided diverticula, status post prior right hemicolectomy with a friable, inflamed, stenotic surgical anastomosis with upstream dilation of the neoterminal ileum/ Crohn's noted, status post biopsy    Current Outpatient Prescriptions  Medication Sig Dispense Refill  . azaTHIOprine (IMURAN) 50 MG tablet TAKE 2 & 1/2 TABLETS DAILY.  75 tablet  5  . baclofen (LIORESAL) 10 MG tablet Take 10 mg by mouth 3 (three) times daily.      . Calcium Carbonate-Vitamin D (CALCIUM 600+D PO) Take by mouth 2 (two) times daily.        . citalopram (CELEXA) 20 MG tablet Take 1 tablet (20 mg total) by mouth 2 (two) times daily.  60 tablet  0  . Cyanocobalamin (VITAMIN B-12 IJ) Inject as directed every 30 (thirty) days.         . fish oil-omega-3 fatty acids 1000 MG capsule Take 2 g by mouth daily.        . hyoscyamine (LEVSIN SL) 0.125 MG SL tablet Place 1 tablet (0.125 mg total) under the tongue 4 (four) times daily -  before meals and at bedtime.  60 tablet  3  . lisinopril-hydrochlorothiazide (PRINZIDE,ZESTORETIC) 20-12.5 MG per tablet Take 1 tablet by mouth daily.  30 tablet  0  . Loratadine 10 MG CAPS Take by mouth daily.        . multivitamin (THERAGRAN) per tablet Take 1 tablet by mouth daily.        Marland Kitchen omeprazole (PRILOSEC) 20 MG capsule Take 20 mg by mouth daily.        . ondansetron (ZOFRAN) 4 MG tablet Take 4 mg by mouth. ONE BY MOUTH EVERY 4-6 HOURS AS NEEDED FOR NAUSEA AND VOMITING       . oxyCODONE (ROXICODONE) 15 MG immediate release tablet Take 15 mg by mouth every 4 (four) hours as needed.       Marland Kitchen PENTASA 500 MG CR capsule TAKE (2) CAPSULES FOUR TIMES DAILY.  240 capsule  11  . Probiotic Product (RESTORA) CAPS Take 1 capsule by mouth daily.  28 capsule  0  . promethazine (PHENERGAN) 12.5 MG tablet 1-2 po 6 h prn nausea or vomiting  40 tablet  0  . vitamin E 1000 UNIT capsule Take 1,000 Units by mouth daily.        Marland Kitchen zinc gluconate 50 MG tablet Take 50 mg by mouth daily.         No current facility-administered medications for this visit.    Allergies as of 11/30/2013 - Review Complete 11/30/2013  Allergen Reaction Noted  . Codeine sulfate      Family History  Problem Relation Age of Onset  . Crohn's disease Mother     succumbed to stomach cancer in her 37s  . Colon cancer Neg Hx     History   Social History  . Marital Status: Legally Separated    Spouse Name: N/A    Number of Children: 2  . Years of Education: N/A   Occupational History  . disability    Social History Main Topics  . Smoking status: Never Smoker   . Smokeless tobacco: None  . Alcohol Use: Yes     Comment: frozen drinks here and there  . Drug Use: No  . Sexual Activity: None   Other Topics Concern  .  None   Social History Narrative  . None    Review of Systems: Gen: Denies fever, chills, anorexia. Denies fatigue, weakness, weight loss.  CV: Denies chest pain, palpitations, syncope, peripheral edema, and claudication. Resp: Denies dyspnea at rest, cough, wheezing, coughing up blood, and pleurisy. GI: Denies vomiting blood, jaundice, and fecal incontinence.   Denies dysphagia or odynophagia. Derm: Denies rash, itching, dry skin Psych: Denies depression, anxiety, memory loss, confusion. No homicidal or suicidal ideation.  Heme: Denies bruising, bleeding, and enlarged lymph nodes.  Physical Exam: BP 118/70  Pulse 87  Temp(Src) 98.5 F (36.9 C) (Oral)  Resp 18  Ht 5' 10"  (1.778 m)  Wt 201 lb (91.173 kg)  BMI 28.84 kg/m2 General:   Alert and oriented. No distress noted. Pleasant and cooperative.  Head:  Normocephalic and atraumatic. Eyes:  Conjuctiva clear without scleral icterus. Mouth:  Oral mucosa pink and moist. Good dentition. No lesions. Abdomen:  +BS, soft, non-tender and non-distended. No rebound or guarding. No HSM or masses noted. Msk:  Symmetrical without gross deformities. Normal posture. Extremities:  Without edema. Neurologic:  Alert and  oriented x4;  grossly normal neurologically. Skin:  Intact without significant lesions or rashes. Psych:  Alert and cooperative. Normal mood and affect.

## 2013-11-30 NOTE — Patient Instructions (Signed)
We will see you back in 6 months!!  Please let us know if you have any issues in the meantime.

## 2013-12-02 NOTE — Assessment & Plan Note (Signed)
Doing well on Pentasa and Imuran 125 mg daily. At baseline for bowel habits. No concerning findings. Rectal discomfort actually improved with spinal injections. Labs in 3 months. Return in 6 months.

## 2013-12-03 NOTE — Progress Notes (Signed)
cc'ed to pcp °

## 2013-12-20 ENCOUNTER — Other Ambulatory Visit: Payer: Self-pay

## 2013-12-20 ENCOUNTER — Telehealth: Payer: Self-pay | Admitting: Internal Medicine

## 2013-12-20 DIAGNOSIS — K50813 Crohn's disease of both small and large intestine with fistula: Secondary | ICD-10-CM

## 2013-12-20 NOTE — Telephone Encounter (Signed)
Please advise when I need to [facility] for next labs

## 2013-12-20 NOTE — Telephone Encounter (Signed)
Patient called this morning to let AS know that for personal reasons she would rather stick with doing her labs every 3 months than every 6 months. If there's any questions you can call patient at [phone]

## 2013-12-20 NOTE — Telephone Encounter (Signed)
That's fine. She can do every 3 months. CBC, CMP now.

## 2013-12-20 NOTE — Telephone Encounter (Signed)
Pt is aware to do CBC and CMP now and orders have been faxed to [facility].

## 2013-12-21 NOTE — Telephone Encounter (Signed)
Reminder in epic to repeat labs in 3 months

## 2014-01-11 ENCOUNTER — Other Ambulatory Visit: Payer: Self-pay

## 2014-01-11 ENCOUNTER — Encounter (HOSPITAL_COMMUNITY): Payer: Self-pay

## 2014-01-11 ENCOUNTER — Encounter (HOSPITAL_COMMUNITY)
Admission: RE | Admit: 2014-01-11 | Discharge: 2014-01-11 | Disposition: A | Discharge status: Home / Self Care | Payer: Medicare Other | Source: Ambulatory Visit | Attending: Ophthalmology | Admitting: Ophthalmology

## 2014-01-11 DIAGNOSIS — H2512 Age-related nuclear cataract, left eye: Secondary | ICD-10-CM | POA: Diagnosis present

## 2014-01-11 DIAGNOSIS — K219 Gastro-esophageal reflux disease without esophagitis: Secondary | ICD-10-CM | POA: Diagnosis not present

## 2014-01-11 DIAGNOSIS — Z8719 Personal history of other diseases of the digestive system: Secondary | ICD-10-CM | POA: Diagnosis not present

## 2014-01-11 DIAGNOSIS — R300 Dysuria: Secondary | ICD-10-CM | POA: Diagnosis not present

## 2014-01-11 DIAGNOSIS — M949 Disorder of cartilage, unspecified: Secondary | ICD-10-CM | POA: Diagnosis not present

## 2014-01-11 DIAGNOSIS — Z800 Family history of malignant neoplasm of digestive organs: Secondary | ICD-10-CM | POA: Diagnosis not present

## 2014-01-11 DIAGNOSIS — K648 Other hemorrhoids: Secondary | ICD-10-CM | POA: Diagnosis not present

## 2014-01-11 DIAGNOSIS — I10 Essential (primary) hypertension: Secondary | ICD-10-CM | POA: Diagnosis not present

## 2014-01-11 DIAGNOSIS — R7989 Other specified abnormal findings of blood chemistry: Secondary | ICD-10-CM | POA: Diagnosis not present

## 2014-01-11 DIAGNOSIS — F419 Anxiety disorder, unspecified: Secondary | ICD-10-CM | POA: Diagnosis not present

## 2014-01-11 DIAGNOSIS — Z888 Allergy status to other drugs, medicaments and biological substances status: Secondary | ICD-10-CM | POA: Diagnosis not present

## 2014-01-11 DIAGNOSIS — K5090 Crohn's disease, unspecified, without complications: Secondary | ICD-10-CM | POA: Diagnosis not present

## 2014-01-11 DIAGNOSIS — M797 Fibromyalgia: Secondary | ICD-10-CM | POA: Diagnosis not present

## 2014-01-11 HISTORY — DX: Other specified postprocedural states: R11.2

## 2014-01-11 HISTORY — DX: Other chronic pain: G89.29

## 2014-01-11 HISTORY — DX: Dorsalgia, unspecified: M54.9

## 2014-01-11 HISTORY — DX: Gastro-esophageal reflux disease without esophagitis: K21.9

## 2014-01-11 HISTORY — DX: Adverse effect of unspecified anesthetic, initial encounter: T41.45XA

## 2014-01-11 HISTORY — DX: Other specified postprocedural states: Z98.890

## 2014-01-11 HISTORY — DX: Essential (primary) hypertension: I10

## 2014-01-11 HISTORY — DX: Other complications of anesthesia, initial encounter: T88.59XA

## 2014-01-11 HISTORY — DX: Cervicalgia: M54.2

## 2014-01-11 LAB — COMPREHENSIVE METABOLIC PANEL
ALBUMIN: 3.9 g/dL (ref 3.5–5.2)
ALK PHOS: 73 U/L (ref 39–117)
ALT: 18 U/L (ref 0–35)
AST: 17 U/L (ref 0–37)
BUN: 16 mg/dL (ref 6–23)
CALCIUM: 9.4 mg/dL (ref 8.4–10.5)
CHLORIDE: 104 meq/L (ref 96–112)
CO2: 28 mEq/L (ref 19–32)
Creat: 0.67 mg/dL (ref 0.50–1.10)
Glucose, Bld: 93 mg/dL (ref 70–99)
POTASSIUM: 3.9 meq/L (ref 3.5–5.3)
SODIUM: 141 meq/L (ref 135–145)
Total Bilirubin: 1.5 mg/dL — ABNORMAL HIGH (ref 0.2–1.2)
Total Protein: 6.5 g/dL (ref 6.0–8.3)

## 2014-01-11 LAB — CBC WITH DIFFERENTIAL/PLATELET
BASOS PCT: 0 % (ref 0–1)
Basophils Absolute: 0 10*3/uL (ref 0.0–0.1)
Eosinophils Absolute: 0.1 10*3/uL (ref 0.0–0.7)
Eosinophils Relative: 1 % (ref 0–5)
HEMATOCRIT: 34.1 % — AB (ref 36.0–46.0)
Hemoglobin: 11.9 g/dL — ABNORMAL LOW (ref 12.0–15.0)
LYMPHS PCT: 25 % (ref 12–46)
Lymphs Abs: 1.6 10*3/uL (ref 0.7–4.0)
MCH: 34 pg (ref 26.0–34.0)
MCHC: 34.9 g/dL (ref 30.0–36.0)
MCV: 97.4 fL (ref 78.0–100.0)
Monocytes Absolute: 0.5 10*3/uL (ref 0.1–1.0)
Monocytes Relative: 8 % (ref 3–12)
NEUTROS PCT: 66 % (ref 43–77)
Neutro Abs: 4.1 10*3/uL (ref 1.7–7.7)
PLATELETS: 211 10*3/uL (ref 150–400)
RBC: 3.5 MIL/uL — ABNORMAL LOW (ref 3.87–5.11)
RDW: 14.9 % (ref 11.5–15.5)
WBC: 6.2 10*3/uL (ref 4.0–10.5)

## 2014-01-11 NOTE — Pre-Procedure Instructions (Signed)
Patient given information to sign up for my chart at home. 

## 2014-01-11 NOTE — Patient Instructions (Signed)
Your procedure is scheduled on: 01/13/2014   Report to [facility] at  27  AM.  Call this number if you have problems the morning of surgery: [phone]   Do not eat food or drink liquids :After Midnight.      Take these medicines the morning of surgery with A SIP OF WATER: baclofen, celexa, lisinopril, prilosec, zofran, oxycodone, phenergan   Do not wear jewelry, make-up or nail polish.  Do not wear lotions, powders, or perfumes.   Do not shave 48 hours prior to surgery.  Do not bring valuables to the hospital.  Contacts, dentures or bridgework may not be worn into surgery.  Leave suitcase in the car. After surgery it may be brought to your room.  For patients admitted to the hospital, checkout time is 11:00 AM the day of discharge.   Patients discharged the day of surgery will not be allowed to drive home.  :     Please read over the following fact sheets that you were given: Coughing and Deep Breathing, Surgical Site Infection Prevention, Anesthesia Post-op Instructions and Care and Recovery After Surgery    Cataract A cataract is a clouding of the lens of the eye. When a lens becomes cloudy, vision is reduced based on the degree and nature of the clouding. Many cataracts reduce vision to some degree. Some cataracts make people more near-sighted as they develop. Other cataracts increase glare. Cataracts that are ignored and become worse can sometimes look white. The white color can be seen through the pupil. CAUSES   Aging. However, cataracts may occur at any age, even in newborns.   Certain drugs.   Trauma to the eye.   Certain diseases such as diabetes.   Specific eye diseases such as chronic inflammation inside the eye or a sudden attack of a rare form of glaucoma.   Inherited or acquired medical problems.  SYMPTOMS   Gradual, progressive drop in vision in the affected eye.   Severe, rapid visual loss. This most often happens when trauma is the cause.  DIAGNOSIS  To  detect a cataract, an eye doctor examines the lens. Cataracts are best diagnosed with an exam of the eyes with the pupils enlarged (dilated) by drops.  TREATMENT  For an early cataract, vision may improve by using different eyeglasses or stronger lighting. If that does not help your vision, surgery is the only effective treatment. A cataract needs to be surgically removed when vision loss interferes with your everyday activities, such as driving, reading, or watching TV. A cataract may also have to be removed if it prevents examination or treatment of another eye problem. Surgery removes the cloudy lens and usually replaces it with a substitute lens (intraocular lens, IOL).  At a time when both you and your doctor agree, the cataract will be surgically removed. If you have cataracts in both eyes, only one is usually removed at a time. This allows the operated eye to heal and be out of danger from any possible problems after surgery (such as infection or poor wound healing). In rare cases, a cataract may be doing damage to your eye. In these cases, your caregiver may advise surgical removal right away. The vast majority of people who have cataract surgery have better vision afterward. HOME CARE INSTRUCTIONS  If you are not planning surgery, you may be asked to do the following:  Use different eyeglasses.   Use stronger or brighter lighting.   Ask your eye doctor about reducing  your medicine dose or changing medicines if it is thought that a medicine caused your cataract. Changing medicines does not make the cataract go away on its own.   Become familiar with your surroundings. Poor vision can lead to injury. Avoid bumping into things on the affected side. You are at a higher risk for tripping or falling.   Exercise extreme care when driving or operating machinery.   Wear sunglasses if you are sensitive to bright light or experiencing problems with glare.  SEEK IMMEDIATE MEDICAL CARE IF:   You have  a worsening or sudden vision loss.   You notice redness, swelling, or increasing pain in the eye.   You have a fever.  Document Released: 01/28/2005 Document Revised: 01/17/2011 Document Reviewed: 09/21/2010 [facility] Patient Information 2012 [place].PATIENT INSTRUCTIONS POST-ANESTHESIA  IMMEDIATELY FOLLOWING SURGERY:  Do not drive or operate machinery for the first twenty four hours after surgery.  Do not make any important decisions for twenty four hours after surgery or while taking narcotic pain medications or sedatives.  If you develop intractable nausea and vomiting or a severe headache please notify your doctor immediately.  FOLLOW-UP:  Please make an appointment with your surgeon as instructed. You do not need to follow up with anesthesia unless specifically instructed to do so.  WOUND CARE INSTRUCTIONS (if applicable):  Keep a dry clean dressing on the anesthesia/puncture wound site if there is drainage.  Once the wound has quit draining you may leave it open to air.  Generally you should leave the bandage intact for twenty four hours unless there is drainage.  If the epidural site drains for more than 36-48 hours please call the anesthesia department.  QUESTIONS?:  Please feel free to call your physician or the hospital operator if you have any questions, and they will be happy to assist you.

## 2014-01-12 MED ORDER — LIDOCAINE HCL 3.5 % OP GEL
OPHTHALMIC | Status: AC
  Filled 2014-01-12: qty 1

## 2014-01-12 MED ORDER — CYCLOPENTOLATE-PHENYLEPHRINE OP SOLN OPTIME - NO CHARGE
OPHTHALMIC | Status: AC
Start: 2014-01-12 — End: 2014-01-12
  Filled 2014-01-12: qty 2

## 2014-01-12 MED ORDER — PHENYLEPHRINE HCL 2.5 % OP SOLN
OPHTHALMIC | Status: AC
  Filled 2014-01-12: qty 15

## 2014-01-12 MED ORDER — NEOMYCIN-POLYMYXIN-DEXAMETH 3.5-10000-0.1 OP SUSP
OPHTHALMIC | Status: AC
  Filled 2014-01-12: qty 5

## 2014-01-12 MED ORDER — LIDOCAINE HCL (PF) 1 % IJ SOLN
INTRAMUSCULAR | Status: AC
  Filled 2014-01-12: qty 2

## 2014-01-12 MED ORDER — TETRACAINE HCL 0.5 % OP SOLN
OPHTHALMIC | Status: AC
  Filled 2014-01-12: qty 2

## 2014-01-13 ENCOUNTER — Ambulatory Visit (HOSPITAL_COMMUNITY)
Admission: RE | Admit: 2014-01-13 | Discharge: 2014-01-13 | Disposition: A | Discharge status: Home / Self Care | Payer: Medicare Other | Source: Ambulatory Visit | Attending: Ophthalmology | Admitting: Ophthalmology

## 2014-01-13 ENCOUNTER — Encounter (HOSPITAL_COMMUNITY): Payer: Self-pay | Admitting: *Deleted

## 2014-01-13 ENCOUNTER — Ambulatory Visit (HOSPITAL_COMMUNITY): Payer: Medicare Other | Admitting: Anesthesiology

## 2014-01-13 ENCOUNTER — Encounter (HOSPITAL_COMMUNITY)
Admission: RE | Disposition: A | Discharge status: Home / Self Care | Payer: Self-pay | Source: Ambulatory Visit | Attending: Ophthalmology

## 2014-01-13 DIAGNOSIS — K219 Gastro-esophageal reflux disease without esophagitis: Secondary | ICD-10-CM | POA: Insufficient documentation

## 2014-01-13 DIAGNOSIS — F419 Anxiety disorder, unspecified: Secondary | ICD-10-CM | POA: Insufficient documentation

## 2014-01-13 DIAGNOSIS — R300 Dysuria: Secondary | ICD-10-CM | POA: Insufficient documentation

## 2014-01-13 DIAGNOSIS — M797 Fibromyalgia: Secondary | ICD-10-CM | POA: Insufficient documentation

## 2014-01-13 DIAGNOSIS — K5090 Crohn's disease, unspecified, without complications: Secondary | ICD-10-CM | POA: Diagnosis not present

## 2014-01-13 DIAGNOSIS — M949 Disorder of cartilage, unspecified: Secondary | ICD-10-CM | POA: Insufficient documentation

## 2014-01-13 DIAGNOSIS — Z888 Allergy status to other drugs, medicaments and biological substances status: Secondary | ICD-10-CM | POA: Insufficient documentation

## 2014-01-13 DIAGNOSIS — K648 Other hemorrhoids: Secondary | ICD-10-CM | POA: Insufficient documentation

## 2014-01-13 DIAGNOSIS — H2512 Age-related nuclear cataract, left eye: Secondary | ICD-10-CM | POA: Diagnosis not present

## 2014-01-13 DIAGNOSIS — Z800 Family history of malignant neoplasm of digestive organs: Secondary | ICD-10-CM | POA: Insufficient documentation

## 2014-01-13 DIAGNOSIS — R7989 Other specified abnormal findings of blood chemistry: Secondary | ICD-10-CM | POA: Insufficient documentation

## 2014-01-13 DIAGNOSIS — I10 Essential (primary) hypertension: Secondary | ICD-10-CM | POA: Insufficient documentation

## 2014-01-13 DIAGNOSIS — Z8719 Personal history of other diseases of the digestive system: Secondary | ICD-10-CM | POA: Insufficient documentation

## 2014-01-13 HISTORY — PX: CATARACT EXTRACTION W/PHACO: SHX586

## 2014-01-13 SURGERY — PHACOEMULSIFICATION, CATARACT, WITH IOL INSERTION
Anesthesia: Monitor Anesthesia Care | Site: Eye | Laterality: Left

## 2014-01-13 MED ORDER — FENTANYL CITRATE 0.05 MG/ML IJ SOLN
INTRAMUSCULAR | Status: AC
  Filled 2014-01-13: qty 2

## 2014-01-13 MED ORDER — LIDOCAINE HCL 3.5 % OP GEL
1.0000 "application " | Freq: Once | OPHTHALMIC | Status: AC
  Administered 2014-01-13: 1 via OPHTHALMIC

## 2014-01-13 MED ORDER — ONDANSETRON HCL 4 MG/2ML IJ SOLN
INTRAMUSCULAR | Status: AC
  Filled 2014-01-13: qty 2

## 2014-01-13 MED ORDER — EPINEPHRINE HCL 1 MG/ML IJ SOLN
INTRAMUSCULAR | Status: AC
  Filled 2014-01-13: qty 1

## 2014-01-13 MED ORDER — EPINEPHRINE HCL 1 MG/ML IJ SOLN
INTRAOCULAR | Status: DC | PRN
  Administered 2014-01-13: 500 mL

## 2014-01-13 MED ORDER — MIDAZOLAM HCL 2 MG/2ML IJ SOLN
1.0000 mg | INTRAMUSCULAR | Status: DC | PRN
  Administered 2014-01-13: 2 mg via INTRAVENOUS

## 2014-01-13 MED ORDER — CYCLOPENTOLATE-PHENYLEPHRINE 0.2-1 % OP SOLN
1.0000 [drp] | OPHTHALMIC | Status: AC
Start: 2014-01-13 — End: 2014-01-13
  Administered 2014-01-13 (×3): 1 [drp] via OPHTHALMIC

## 2014-01-13 MED ORDER — LIDOCAINE 3.5 % OP GEL OPTIME - NO CHARGE
OPHTHALMIC | Status: DC | PRN
  Administered 2014-01-13: 1 [drp] via OPHTHALMIC

## 2014-01-13 MED ORDER — NEOMYCIN-POLYMYXIN-DEXAMETH 3.5-10000-0.1 OP SUSP
OPHTHALMIC | Status: DC | PRN
  Administered 2014-01-13: 1 [drp] via OPHTHALMIC

## 2014-01-13 MED ORDER — LACTATED RINGERS IV SOLN
INTRAVENOUS | Status: DC
  Administered 2014-01-13: 1000 mL via INTRAVENOUS

## 2014-01-13 MED ORDER — LIDOCAINE HCL (PF) 1 % IJ SOLN
INTRAMUSCULAR | Status: DC | PRN
  Administered 2014-01-13: .5 mL

## 2014-01-13 MED ORDER — TETRACAINE HCL 0.5 % OP SOLN
1.0000 [drp] | OPHTHALMIC | Status: AC
Start: 2014-01-13 — End: 2014-01-13
  Administered 2014-01-13 (×3): 1 [drp] via OPHTHALMIC

## 2014-01-13 MED ORDER — MIDAZOLAM HCL 2 MG/2ML IJ SOLN
INTRAMUSCULAR | Status: AC
  Filled 2014-01-13: qty 2

## 2014-01-13 MED ORDER — ONDANSETRON HCL 4 MG/2ML IJ SOLN
4.0000 mg | Freq: Once | INTRAMUSCULAR | Status: AC
  Administered 2014-01-13: 4 mg via INTRAVENOUS

## 2014-01-13 MED ORDER — POVIDONE-IODINE 5 % OP SOLN
OPHTHALMIC | Status: DC | PRN
  Administered 2014-01-13: 1 via OPHTHALMIC

## 2014-01-13 MED ORDER — FENTANYL CITRATE 0.05 MG/ML IJ SOLN
25.0000 ug | INTRAMUSCULAR | Status: AC
  Administered 2014-01-13: 25 ug via INTRAVENOUS

## 2014-01-13 MED ORDER — PHENYLEPHRINE HCL 2.5 % OP SOLN
1.0000 [drp] | OPHTHALMIC | Status: AC
  Administered 2014-01-13 (×3): 1 [drp] via OPHTHALMIC

## 2014-01-13 MED ORDER — PROVISC 10 MG/ML IO SOLN
INTRAOCULAR | Status: DC | PRN
Start: 2014-01-13 — End: 2014-01-13
  Administered 2014-01-13: 0.85 mL via INTRAOCULAR

## 2014-01-13 MED ORDER — BSS IO SOLN
INTRAOCULAR | Status: DC | PRN
  Administered 2014-01-13: 30 mL via INTRAOCULAR

## 2014-01-13 SURGICAL SUPPLY — 34 items
CAPSULAR TENSION RING-AMO (OPHTHALMIC RELATED) IMPLANT
CLOTH BEACON ORANGE TIMEOUT ST (SAFETY) ×3 IMPLANT
EYE SHIELD UNIVERSAL CLEAR (GAUZE/BANDAGES/DRESSINGS) ×3 IMPLANT
GLOVE BIO SURGEON STRL SZ 6.5 (GLOVE) IMPLANT
GLOVE BIO SURGEONS STRL SZ 6.5 (GLOVE)
GLOVE BIOGEL PI IND STRL 6.5 (GLOVE) ×2 IMPLANT
GLOVE BIOGEL PI IND STRL 7.0 (GLOVE) IMPLANT
GLOVE BIOGEL PI IND STRL 7.5 (GLOVE) IMPLANT
GLOVE BIOGEL PI INDICATOR 6.5 (GLOVE) ×4
GLOVE BIOGEL PI INDICATOR 7.0 (GLOVE)
GLOVE BIOGEL PI INDICATOR 7.5 (GLOVE)
GLOVE ECLIPSE 6.5 STRL STRAW (GLOVE) IMPLANT
GLOVE ECLIPSE 7.0 STRL STRAW (GLOVE) IMPLANT
GLOVE ECLIPSE 7.5 STRL STRAW (GLOVE) IMPLANT
GLOVE EXAM NITRILE LRG STRL (GLOVE) IMPLANT
GLOVE EXAM NITRILE MD LF STRL (GLOVE) IMPLANT
GLOVE SKINSENSE NS SZ6.5 (GLOVE)
GLOVE SKINSENSE NS SZ7.0 (GLOVE)
GLOVE SKINSENSE STRL SZ6.5 (GLOVE) IMPLANT
GLOVE SKINSENSE STRL SZ7.0 (GLOVE) IMPLANT
KIT VITRECTOMY (OPHTHALMIC RELATED) IMPLANT
PAD ARMBOARD 7.5X6 YLW CONV (MISCELLANEOUS) ×3 IMPLANT
PROC W NO LENS (INTRAOCULAR LENS)
PROC W SPEC LENS (INTRAOCULAR LENS)
PROCESS W NO LENS (INTRAOCULAR LENS) IMPLANT
PROCESS W SPEC LENS (INTRAOCULAR LENS) IMPLANT
RETRACTOR IRIS SIGHTPATH (OPHTHALMIC RELATED) IMPLANT
RING MALYGIN (MISCELLANEOUS) IMPLANT
SIGHTPATH CAT PROC W REG LENS (Ophthalmic Related) ×3 IMPLANT
SYRINGE LUER LOK 1CC (MISCELLANEOUS) ×3 IMPLANT
TAPE SURG TRANSPORE 1 IN (GAUZE/BANDAGES/DRESSINGS) ×1 IMPLANT
TAPE SURGICAL TRANSPORE 1 IN (GAUZE/BANDAGES/DRESSINGS) ×2
VISCOELASTIC ADDITIONAL (OPHTHALMIC RELATED) IMPLANT
WATER STERILE IRR 250ML POUR (IV SOLUTION) ×3 IMPLANT

## 2014-01-13 NOTE — Anesthesia Postprocedure Evaluation (Signed)
  Anesthesia Post-op Note  Patient: Sheri [name]  Procedure(s) Performed: Procedure(s) with comments: CATARACT EXTRACTION PHACO AND INTRAOCULAR LENS PLACEMENT LEFT EYE (Left) - CDE:6.90  Patient Location: Short Stay  Anesthesia Type:MAC  Level of Consciousness: awake, alert , oriented and patient cooperative  Airway and Oxygen Therapy: Patient Spontanous Breathing  Post-op Pain: none  Post-op Assessment: Post-op Vital signs reviewed, Patient's Cardiovascular Status Stable, Respiratory Function Stable, Patent Airway, No signs of Nausea or vomiting and Pain level controlled  Post-op Vital Signs: Reviewed and stable  Last Vitals:  Filed Vitals:   01/13/14 1056  BP: 107/65  Pulse: 74  Temp: 36.9 C  Resp: 18    Complications: No apparent anesthesia complications

## 2014-01-13 NOTE — Anesthesia Procedure Notes (Signed)
Procedure Name: MAC Date/Time: 01/13/2014 11:20 AM Performed by: Andree Elk, AMY A Pre-anesthesia Checklist: Patient identified, Timeout performed, Emergency Drugs available, Suction available and Patient being monitored Oxygen Delivery Method: Nasal cannula

## 2014-01-13 NOTE — Anesthesia Preprocedure Evaluation (Signed)
Anesthesia Evaluation  Patient identified by MRN, date of birth, ID band Patient awake    Reviewed: Allergy & Precautions, H&P , NPO status , Patient's Chart, lab work & pertinent test results  History of Anesthesia Complications (+) PONV and history of anesthetic complications  Airway Mallampati: II  TM Distance: >3 FB     Dental  (+) Teeth Intact   Pulmonary neg pulmonary ROS,  breath sounds clear to auscultation        Cardiovascular hypertension, Pt. on medications Rhythm:Regular Rate:Normal     Neuro/Psych    GI/Hepatic GERD-  Medicated,Crohn's Dx    Endo/Other    Renal/GU      Musculoskeletal  (+) Fibromyalgia -  Abdominal   Peds  Hematology   Anesthesia Other Findings   Reproductive/Obstetrics                             Anesthesia Physical Anesthesia Plan  ASA: II  Anesthesia Plan: MAC   Post-op Pain Management:    Induction: Intravenous  Airway Management Planned: Nasal Cannula  Additional Equipment:   Intra-op Plan:   Post-operative Plan:   Informed Consent: I have reviewed the patients History and Physical, chart, labs and discussed the procedure including the risks, benefits and alternatives for the proposed anesthesia with the patient or authorized representative who has indicated his/her understanding and acceptance.     Plan Discussed with:   Anesthesia Plan Comments:         Anesthesia Quick Evaluation

## 2014-01-13 NOTE — Op Note (Signed)
Date of Admission: 01/13/2014  Date of Surgery: 01/13/2014   Pre-Op Dx: Cataract Left Eye  Post-Op Dx: Senile Nuclear Cataract Left  Eye,  Dx Code H25.12  Surgeon: Tonny Branch, M.D.  Assistants: None  Anesthesia: Topical with MAC  Indications: Painless, progressive loss of vision with compromise of daily activities.  Surgery: Cataract Extraction with Intraocular lens Implant Left Eye  Discription: The patient had dilating drops and viscous lidocaine placed into the Left eye in the pre-op holding area. After transfer to the operating room, a time out was performed. The patient was then prepped and draped. Beginning with a 62 degree blade a paracentesis port was made at the surgeon's 2 o'clock position. The anterior chamber was then filled with 1% non-preserved lidocaine. This was followed by filling the anterior chamber with Provisc.  A 2.80m keratome blade was used to make a clear corneal incision at the temporal limbus.  A bent cystatome needle was used to create a continuous tear capsulotomy. Hydrodissection was performed with balanced salt solution on a Fine canula. The lens nucleus was then removed using the phacoemulsification handpiece. Residual cortex was removed with the I&A handpiece. The anterior chamber and capsular bag were refilled with Provisc. A posterior chamber intraocular lens was placed into the capsular bag with it's injector. The implant was positioned with the Kuglan hook. The Provisc was then removed from the anterior chamber and capsular bag with the I&A handpiece. Stromal hydration of the main incision and paracentesis port was performed with BSS on a Fine canula. The wounds were tested for leak which was negative. The patient tolerated the procedure well. There were no operative complications. The patient was then transferred to the recovery room in stable condition.  Complications: None  Specimen: None  EBL: None  Prosthetic device: Hoya iSert 250, power 21.5 D, SN  [id number].

## 2014-01-13 NOTE — H&P (Signed)
I have reviewed the H&P, the patient was re-examined, and I have identified no interval changes in medical condition and plan of care since the history and physical of record  

## 2014-01-13 NOTE — Discharge Instructions (Signed)

## 2014-01-13 NOTE — Transfer of Care (Signed)
Immediate Anesthesia Transfer of Care Note  Patient: Sheri [name]  Procedure(s) Performed: Procedure(s) with comments: CATARACT EXTRACTION PHACO AND INTRAOCULAR LENS PLACEMENT LEFT EYE (Left) - CDE:6.90  Patient Location: Short Stay  Anesthesia Type:MAC  Level of Consciousness: awake, alert  and oriented  Airway & Oxygen Therapy: Patient Spontanous Breathing  Post-op Assessment: Report given to PACU RN and Post -op Vital signs reviewed and stable  Post vital signs: Reviewed and stable  Complications: No apparent anesthesia complications

## 2014-01-14 ENCOUNTER — Encounter (HOSPITAL_COMMUNITY): Payer: Self-pay | Admitting: Ophthalmology

## 2014-01-19 ENCOUNTER — Encounter (HOSPITAL_COMMUNITY)
Admission: RE | Admit: 2014-01-19 | Discharge: 2014-01-19 | Disposition: A | Discharge status: Home / Self Care | Payer: Medicare Other | Source: Ambulatory Visit | Attending: Ophthalmology | Admitting: Ophthalmology

## 2014-01-19 ENCOUNTER — Encounter (HOSPITAL_COMMUNITY): Payer: Self-pay

## 2014-01-21 MED ORDER — TETRACAINE HCL 0.5 % OP SOLN
OPHTHALMIC | Status: AC
  Filled 2014-01-21: qty 2

## 2014-01-21 MED ORDER — PHENYLEPHRINE HCL 2.5 % OP SOLN
OPHTHALMIC | Status: AC
  Filled 2014-01-21: qty 15

## 2014-01-21 MED ORDER — NEOMYCIN-POLYMYXIN-DEXAMETH 3.5-10000-0.1 OP SUSP
OPHTHALMIC | Status: AC
  Filled 2014-01-21: qty 5

## 2014-01-21 MED ORDER — CYCLOPENTOLATE-PHENYLEPHRINE OP SOLN OPTIME - NO CHARGE
OPHTHALMIC | Status: AC
  Filled 2014-01-21: qty 2

## 2014-01-21 MED ORDER — LIDOCAINE HCL 3.5 % OP GEL
OPHTHALMIC | Status: AC
  Filled 2014-01-21: qty 1

## 2014-01-21 MED ORDER — LIDOCAINE HCL (PF) 1 % IJ SOLN
INTRAMUSCULAR | Status: AC
Start: 2014-01-21 — End: 2014-01-21
  Filled 2014-01-21: qty 2

## 2014-01-21 MED ORDER — KETOROLAC TROMETHAMINE 0.5 % OP SOLN: 1.0000 [drp] | OPHTHALMIC | Status: DC

## 2014-01-24 ENCOUNTER — Encounter (HOSPITAL_COMMUNITY)
Admission: RE | Disposition: A | Discharge status: Home / Self Care | Payer: Self-pay | Source: Ambulatory Visit | Attending: Ophthalmology

## 2014-01-24 ENCOUNTER — Encounter (HOSPITAL_COMMUNITY): Payer: Self-pay | Admitting: *Deleted

## 2014-01-24 ENCOUNTER — Ambulatory Visit (HOSPITAL_COMMUNITY): Payer: Medicare Other | Admitting: Anesthesiology

## 2014-01-24 ENCOUNTER — Ambulatory Visit (HOSPITAL_COMMUNITY)
Admission: RE | Admit: 2014-01-24 | Discharge: 2014-01-24 | Disposition: A | Discharge status: Home / Self Care | Payer: Medicare Other | Source: Ambulatory Visit | Attending: Ophthalmology | Admitting: Ophthalmology

## 2014-01-24 DIAGNOSIS — H269 Unspecified cataract: Secondary | ICD-10-CM | POA: Diagnosis present

## 2014-01-24 DIAGNOSIS — H2511 Age-related nuclear cataract, right eye: Secondary | ICD-10-CM | POA: Insufficient documentation

## 2014-01-24 HISTORY — PX: CATARACT EXTRACTION W/PHACO: SHX586

## 2014-01-24 SURGERY — PHACOEMULSIFICATION, CATARACT, WITH IOL INSERTION
Anesthesia: Monitor Anesthesia Care | Site: Eye | Laterality: Right

## 2014-01-24 MED ORDER — NEOMYCIN-POLYMYXIN-DEXAMETH 3.5-10000-0.1 OP SUSP
OPHTHALMIC | Status: DC | PRN
  Administered 2014-01-24: 1 [drp] via OPHTHALMIC

## 2014-01-24 MED ORDER — LIDOCAINE 3.5 % OP GEL OPTIME - NO CHARGE
OPHTHALMIC | Status: DC | PRN
  Administered 2014-01-24: 1 [drp] via OPHTHALMIC

## 2014-01-24 MED ORDER — FENTANYL CITRATE 0.05 MG/ML IJ SOLN
INTRAMUSCULAR | Status: AC
  Filled 2014-01-24: qty 2

## 2014-01-24 MED ORDER — MIDAZOLAM HCL 2 MG/2ML IJ SOLN
INTRAMUSCULAR | Status: AC
  Filled 2014-01-24: qty 2

## 2014-01-24 MED ORDER — LACTATED RINGERS IV SOLN
INTRAVENOUS | Status: DC
  Administered 2014-01-24: 09:00:00 via INTRAVENOUS

## 2014-01-24 MED ORDER — LIDOCAINE HCL 3.5 % OP GEL
1.0000 "application " | Freq: Once | OPHTHALMIC | Status: AC
  Administered 2014-01-24: 1 via OPHTHALMIC

## 2014-01-24 MED ORDER — PHENYLEPHRINE HCL 2.5 % OP SOLN
1.0000 [drp] | OPHTHALMIC | Status: AC
  Administered 2014-01-24 (×3): 1 [drp] via OPHTHALMIC

## 2014-01-24 MED ORDER — LIDOCAINE HCL (PF) 1 % IJ SOLN
INTRAMUSCULAR | Status: DC | PRN
  Administered 2014-01-24: .5 mL

## 2014-01-24 MED ORDER — PROVISC 10 MG/ML IO SOLN
INTRAOCULAR | Status: DC | PRN
  Administered 2014-01-24: 0.85 mL via INTRAOCULAR

## 2014-01-24 MED ORDER — EPINEPHRINE HCL 1 MG/ML IJ SOLN
INTRAOCULAR | Status: DC | PRN
  Administered 2014-01-24: 09:00:00

## 2014-01-24 MED ORDER — ONDANSETRON HCL 4 MG/2ML IJ SOLN
4.0000 mg | Freq: Once | INTRAMUSCULAR | Status: AC
  Administered 2014-01-24: 4 mg via INTRAVENOUS

## 2014-01-24 MED ORDER — CYCLOPENTOLATE-PHENYLEPHRINE 0.2-1 % OP SOLN
1.0000 [drp] | OPHTHALMIC | Status: AC
  Administered 2014-01-24 (×3): 1 [drp] via OPHTHALMIC

## 2014-01-24 MED ORDER — TETRACAINE HCL 0.5 % OP SOLN
1.0000 [drp] | OPHTHALMIC | Status: AC
  Administered 2014-01-24 (×3): 1 [drp] via OPHTHALMIC

## 2014-01-24 MED ORDER — LACTATED RINGERS IV SOLN
INTRAVENOUS | Status: DC | PRN
  Administered 2014-01-24: 09:00:00 via INTRAVENOUS

## 2014-01-24 MED ORDER — POVIDONE-IODINE 5 % OP SOLN
OPHTHALMIC | Status: DC | PRN
  Administered 2014-01-24: 1 via OPHTHALMIC

## 2014-01-24 MED ORDER — MIDAZOLAM HCL 2 MG/2ML IJ SOLN
1.0000 mg | INTRAMUSCULAR | Status: DC | PRN
  Administered 2014-01-24: 2 mg via INTRAVENOUS

## 2014-01-24 MED ORDER — EPINEPHRINE HCL 1 MG/ML IJ SOLN
INTRAMUSCULAR | Status: AC
  Filled 2014-01-24: qty 1

## 2014-01-24 MED ORDER — BSS IO SOLN
INTRAOCULAR | Status: DC | PRN
  Administered 2014-01-24: 30 mL via INTRAOCULAR

## 2014-01-24 MED ORDER — FENTANYL CITRATE 0.05 MG/ML IJ SOLN
25.0000 ug | INTRAMUSCULAR | Status: AC
  Administered 2014-01-24 (×2): 25 ug via INTRAVENOUS

## 2014-01-24 MED ORDER — ONDANSETRON HCL 4 MG/2ML IJ SOLN
INTRAMUSCULAR | Status: AC
  Filled 2014-01-24: qty 2

## 2014-01-24 SURGICAL SUPPLY — 34 items
CAPSULAR TENSION RING-AMO (OPHTHALMIC RELATED) IMPLANT
CLOTH BEACON ORANGE TIMEOUT ST (SAFETY) ×3 IMPLANT
EYE SHIELD UNIVERSAL CLEAR (GAUZE/BANDAGES/DRESSINGS) ×3 IMPLANT
GLOVE BIO SURGEON STRL SZ 6.5 (GLOVE) IMPLANT
GLOVE BIO SURGEONS STRL SZ 6.5 (GLOVE)
GLOVE BIOGEL PI IND STRL 6.5 (GLOVE) ×2 IMPLANT
GLOVE BIOGEL PI IND STRL 7.0 (GLOVE) IMPLANT
GLOVE BIOGEL PI IND STRL 7.5 (GLOVE) IMPLANT
GLOVE BIOGEL PI INDICATOR 6.5 (GLOVE) ×4
GLOVE BIOGEL PI INDICATOR 7.0 (GLOVE)
GLOVE BIOGEL PI INDICATOR 7.5 (GLOVE)
GLOVE ECLIPSE 6.5 STRL STRAW (GLOVE) IMPLANT
GLOVE ECLIPSE 7.0 STRL STRAW (GLOVE) IMPLANT
GLOVE ECLIPSE 7.5 STRL STRAW (GLOVE) IMPLANT
GLOVE EXAM NITRILE LRG STRL (GLOVE) IMPLANT
GLOVE EXAM NITRILE MD LF STRL (GLOVE) IMPLANT
GLOVE SKINSENSE NS SZ6.5 (GLOVE)
GLOVE SKINSENSE NS SZ7.0 (GLOVE)
GLOVE SKINSENSE STRL SZ6.5 (GLOVE) IMPLANT
GLOVE SKINSENSE STRL SZ7.0 (GLOVE) IMPLANT
KIT VITRECTOMY (OPHTHALMIC RELATED) IMPLANT
PAD ARMBOARD 7.5X6 YLW CONV (MISCELLANEOUS) ×3 IMPLANT
PROC W NO LENS (INTRAOCULAR LENS)
PROC W SPEC LENS (INTRAOCULAR LENS)
PROCESS W NO LENS (INTRAOCULAR LENS) IMPLANT
PROCESS W SPEC LENS (INTRAOCULAR LENS) IMPLANT
RETRACTOR IRIS SIGHTPATH (OPHTHALMIC RELATED) IMPLANT
RING MALYGIN (MISCELLANEOUS) IMPLANT
SIGHTPATH CAT PROC W REG LENS (Ophthalmic Related) ×3 IMPLANT
SYRINGE LUER LOK 1CC (MISCELLANEOUS) ×3 IMPLANT
TAPE SURG TRANSPORE 1 IN (GAUZE/BANDAGES/DRESSINGS) ×1 IMPLANT
TAPE SURGICAL TRANSPORE 1 IN (GAUZE/BANDAGES/DRESSINGS) ×2
VISCOELASTIC ADDITIONAL (OPHTHALMIC RELATED) IMPLANT
WATER STERILE IRR 250ML POUR (IV SOLUTION) ×3 IMPLANT

## 2014-01-24 NOTE — Anesthesia Procedure Notes (Signed)
Procedure Name: MAC Date/Time: 01/24/2014 9:11 AM Performed by: Andree Elk, Kaegan Hettich A Pre-anesthesia Checklist: Patient identified, Timeout performed, Emergency Drugs available, Suction available and Patient being monitored Oxygen Delivery Method: Nasal cannula

## 2014-01-24 NOTE — Discharge Instructions (Signed)

## 2014-01-24 NOTE — Op Note (Signed)
Date of Admission: 01/24/2014  Date of Surgery: 01/24/2014   Pre-Op Dx: Cataract Right Eye  Post-Op Dx: Senile Nuclear Cataract Right  Eye,  Dx Code H25.11  Surgeon: Tonny Branch, M.D.  Assistants: None  Anesthesia: Topical with MAC  Indications: Painless, progressive loss of vision with compromise of daily activities.  Surgery: Cataract Extraction with Intraocular lens Implant Right Eye  Discription: The patient had dilating drops and viscous lidocaine placed into the Right eye in the pre-op holding area. After transfer to the operating room, a time out was performed. The patient was then prepped and draped. Beginning with a 106 degree blade a paracentesis port was made at the surgeon's 2 o'clock position. The anterior chamber was then filled with 1% non-preserved lidocaine. This was followed by filling the anterior chamber with Provisc.  A 2.16m keratome blade was used to make a clear corneal incision at the temporal limbus.  A bent cystatome needle was used to create a continuous tear capsulotomy. Hydrodissection was performed with balanced salt solution on a Fine canula. The lens nucleus was then removed using the phacoemulsification handpiece. Residual cortex was removed with the I&A handpiece. The anterior chamber and capsular bag were refilled with Provisc. A posterior chamber intraocular lens was placed into the capsular bag with it's injector. The implant was positioned with the Kuglan hook. The Provisc was then removed from the anterior chamber and capsular bag with the I&A handpiece. Stromal hydration of the main incision and paracentesis port was performed with BSS on a Fine canula. The wounds were tested for leak which was negative. The patient tolerated the procedure well. There were no operative complications. The patient was then transferred to the recovery room in stable condition.  Complications: None  Specimen: None  EBL: None  Prosthetic device: Hoya iSert 250, power 22.0  D, SN [id number]

## 2014-01-24 NOTE — Anesthesia Preprocedure Evaluation (Signed)
Anesthesia Evaluation  Patient identified by MRN, date of birth, ID band Patient awake    Reviewed: Allergy & Precautions, H&P , NPO status , Patient's Chart, lab work & pertinent test results  History of Anesthesia Complications (+) PONV and history of anesthetic complications  Airway Mallampati: II  TM Distance: >3 FB     Dental  (+) Teeth Intact   Pulmonary neg pulmonary ROS,  breath sounds clear to auscultation        Cardiovascular hypertension, Pt. on medications Rhythm:Regular Rate:Normal     Neuro/Psych    GI/Hepatic GERD-  Medicated,Crohn's Dx    Endo/Other    Renal/GU      Musculoskeletal  (+) Fibromyalgia -  Abdominal   Peds  Hematology   Anesthesia Other Findings   Reproductive/Obstetrics                             Anesthesia Physical Anesthesia Plan  ASA: II  Anesthesia Plan: MAC   Post-op Pain Management:    Induction: Intravenous  Airway Management Planned: Nasal Cannula  Additional Equipment:   Intra-op Plan:   Post-operative Plan:   Informed Consent: I have reviewed the patients History and Physical, chart, labs and discussed the procedure including the risks, benefits and alternatives for the proposed anesthesia with the patient or authorized representative who has indicated his/her understanding and acceptance.     Plan Discussed with:   Anesthesia Plan Comments:         Anesthesia Quick Evaluation

## 2014-01-24 NOTE — H&P (Signed)
I have reviewed the H&P, the patient was re-examined, and I have identified no interval changes in medical condition and plan of care since the history and physical of record  

## 2014-01-24 NOTE — Transfer of Care (Signed)
Immediate Anesthesia Transfer of Care Note  Patient: Sheri [name]  Procedure(s) Performed: Procedure(s) with comments: CATARACT EXTRACTION PHACO AND INTRAOCULAR LENS PLACEMENT (IOC) (Right) - CDE:5.91  Patient Location: Short Stay  Anesthesia Type:MAC  Level of Consciousness: awake, alert , oriented and patient cooperative  Airway & Oxygen Therapy: Patient Spontanous Breathing  Post-op Assessment: Report given to PACU RN and Post -op Vital signs reviewed and stable  Post vital signs: Reviewed and stable  Complications: No apparent anesthesia complications

## 2014-01-24 NOTE — Anesthesia Postprocedure Evaluation (Signed)
  Anesthesia Post-op Note  Patient: Sheri [name]  Procedure(s) Performed: Procedure(s) with comments: CATARACT EXTRACTION PHACO AND INTRAOCULAR LENS PLACEMENT (IOC) (Right) - CDE:5.91  Patient Location: Short Stay  Anesthesia Type:MAC  Level of Consciousness: awake, alert , oriented and patient cooperative  Airway and Oxygen Therapy: Patient Spontanous Breathing  Post-op Pain: none  Post-op Assessment: Post-op Vital signs reviewed, Patient's Cardiovascular Status Stable, Respiratory Function Stable, Patent Airway, No signs of Nausea or vomiting and Pain level controlled  Post-op Vital Signs: Reviewed and stable  Last Vitals:  Filed Vitals:   01/24/14 0910  BP: 111/68  Pulse:   Temp:   Resp:     Complications: No apparent anesthesia complications

## 2014-01-25 NOTE — Progress Notes (Signed)
Quick Note:  Hgb just mildly below normal at 11.9, non-specific. Normocytic. Could be secondary to chronic disease. I recommend rechecking in 6 weeks.  LFTs overall without significant change. Chronic elevation of Tbili, non-specific. Mild.  Repeat at next visit. ______

## 2014-01-26 ENCOUNTER — Encounter (HOSPITAL_COMMUNITY): Payer: Self-pay | Admitting: Ophthalmology

## 2014-01-31 ENCOUNTER — Other Ambulatory Visit: Payer: Self-pay | Admitting: Gastroenterology

## 2014-01-31 DIAGNOSIS — D649 Anemia, unspecified: Secondary | ICD-10-CM

## 2014-03-07 ENCOUNTER — Other Ambulatory Visit: Payer: Self-pay

## 2014-03-07 DIAGNOSIS — D649 Anemia, unspecified: Secondary | ICD-10-CM

## 2014-03-15 ENCOUNTER — Telehealth: Payer: Self-pay | Admitting: Internal Medicine

## 2014-03-15 NOTE — Telephone Encounter (Signed)
PATIENT ON February RECALL FOR LABS

## 2014-03-15 NOTE — Telephone Encounter (Signed)
Letter and lab order have been mailed to the pt.

## 2014-04-14 ENCOUNTER — Telehealth: Payer: Self-pay

## 2014-04-14 NOTE — Telephone Encounter (Signed)
Tried to do a PA for this pts pentasa and it was denied. Pt has been on pentasa since 2008 and I couldn't find any record of her trying anything else. Per the letter received, Covered alternatives are: apriso, asacol HD, delzicol.  Do you want to try to change her or do an appeal?   Pt last seen in 11/2013 by AS.

## 2014-04-14 NOTE — Telephone Encounter (Signed)
We need to do an appeal. Do I do a letter?

## 2014-04-14 NOTE — Telephone Encounter (Signed)
yes

## 2014-04-15 ENCOUNTER — Encounter: Payer: Self-pay | Admitting: Gastroenterology

## 2014-04-15 NOTE — Telephone Encounter (Signed)
I completed the letter!

## 2014-04-15 NOTE — Telephone Encounter (Signed)
Letter has been faxed to the appeals department.

## 2014-04-25 LAB — HEMATOCRIT
HCT: 38 %
Hemoglobin: 13.2 g/dL (ref 11.8–15.5)

## 2014-05-05 ENCOUNTER — Encounter: Payer: Self-pay | Admitting: Internal Medicine

## 2014-05-10 ENCOUNTER — Other Ambulatory Visit: Payer: Self-pay | Admitting: Gastroenterology

## 2014-06-21 ENCOUNTER — Other Ambulatory Visit: Payer: Self-pay

## 2014-06-21 ENCOUNTER — Encounter: Payer: Self-pay | Admitting: Internal Medicine

## 2014-06-21 ENCOUNTER — Ambulatory Visit (INDEPENDENT_AMBULATORY_CARE_PROVIDER_SITE_OTHER): Payer: Medicare Other | Admitting: Internal Medicine

## 2014-06-21 VITALS — BP 135/75 | HR 92 | Temp 98.4°F | Ht 70.0 in | Wt 208.8 lb

## 2014-06-21 DIAGNOSIS — K5080 Crohn's disease of both small and large intestine without complications: Secondary | ICD-10-CM

## 2014-06-21 DIAGNOSIS — K921 Melena: Secondary | ICD-10-CM

## 2014-06-21 DIAGNOSIS — K50919 Crohn's disease, unspecified, with unspecified complications: Secondary | ICD-10-CM

## 2014-06-21 LAB — CBC WITH DIFFERENTIAL/PLATELET
BASOS ABS: 0 10*3/uL (ref 0.0–0.1)
Basophils Relative: 0 % (ref 0–1)
EOS PCT: 0 % (ref 0–5)
Eosinophils Absolute: 0 10*3/uL (ref 0.0–0.7)
HCT: 35.6 % — ABNORMAL LOW (ref 36.0–46.0)
HEMOGLOBIN: 12.8 g/dL (ref 12.0–15.0)
LYMPHS ABS: 0.6 10*3/uL — AB (ref 0.7–4.0)
LYMPHS PCT: 10 % — AB (ref 12–46)
MCH: 34.5 pg — ABNORMAL HIGH (ref 26.0–34.0)
MCHC: 36 g/dL (ref 30.0–36.0)
MCV: 96 fL (ref 78.0–100.0)
MONO ABS: 0.2 10*3/uL (ref 0.1–1.0)
MPV: 9.4 fL (ref 8.6–12.4)
Monocytes Relative: 3 % (ref 3–12)
NEUTROS ABS: 5 10*3/uL (ref 1.7–7.7)
Neutrophils Relative %: 87 % — ABNORMAL HIGH (ref 43–77)
PLATELETS: 234 10*3/uL (ref 150–400)
RBC: 3.71 MIL/uL — AB (ref 3.87–5.11)
RDW: 14.3 % (ref 11.5–15.5)
WBC: 5.7 10*3/uL (ref 4.0–10.5)

## 2014-06-21 MED ORDER — NA SULFATE-K SULFATE-MG SULF 17.5-3.13-1.6 GM/177ML PO SOLN: 1.0000 | ORAL | Status: DC

## 2014-06-21 NOTE — Patient Instructions (Signed)
Schedule diagnostic colonoscopy to reassess Crohn's disease. Recent hematochezia. Abdominal pain  Split movie prep  Hepatic profile and CBC today  Further recommendations to follow.

## 2014-06-21 NOTE — Progress Notes (Signed)
Primary Care Physician:  Monico Blitz, MD Primary Gastroenterologist:  Dr. Gala Romney  Pre-Procedure History & Physical: HPI:  Sheri [name] is a 64 y.o. female here for followup of ileocolonic Crohn's disease. Has intermittent crampy abdominal pain since her last visit. Has determined gross blood per rectum just this morning really hasn't had any trouble since her last office visit otherwise. Has tendency towards nonbloody diarrhea. On Imuran and Pentasa. Patient reminded me that many years ago in [place] she tried Remicade which did not really work. Mild anemia noted last year with followup H&H in March of the normal. Mildly elevated bilirubin last year as well.  Prior CT demonstrated no evidence of perirectal fistula/abscess.  Patient stopped taking Fosamax as her DEXA scan came back reportedly normal. She continues to take vitamin B12 injections twice monthly.  Past Medical History  Diagnosis Date  . Crohn disease   . Anemia   . Low back pain   . Fibromyalgia   . Osteopenia     Last DEXA 12/2010 was normal, on fosamax  . Complication of anesthesia   . PONV (postoperative nausea and vomiting)   . GERD (gastroesophageal reflux disease)   . Hypertension   . Chronic back pain   . Chronic neck pain     Past Surgical History  Procedure Laterality Date  . Small intestine surgery  [id number]  . Cesarean section  [id number]  . Appendectomy  1990  . Drainage of back abscess  2007  . Colonoscopy  09/11/2006  . Esophagogastroduodenoscopy  04/2009    noncritical schatzki's ring, small hh, sb bx negative  . Colonoscopy  10/30/2010    RMR: External and internal hemorrhoids, likely source of hematochezia  otherwise normal rectum/ Left-sided diverticula, status post prior right hemicolectomy with a friable, inflamed, stenotic surgical anastomosis with upstream dilation of the neoterminal ileum/ Crohn's noted, status post biopsy  . Cataract extraction w/phaco Left 01/13/2014    Procedure: CATARACT  EXTRACTION PHACO AND INTRAOCULAR LENS PLACEMENT LEFT EYE;  Surgeon: Tonny Branch, MD;  Location: [facility];  Service: Ophthalmology;  Laterality: Left;  CDE:6.90  . Cataract extraction w/phaco Right 01/24/2014    Procedure: CATARACT EXTRACTION PHACO AND INTRAOCULAR LENS PLACEMENT (IOC);  Surgeon: Tonny Branch, MD;  Location: [facility];  Service: Ophthalmology;  Laterality: Right;  CDE:5.91    Prior to Admission medications   Medication Sig Start Date End Date Taking? Authorizing Provider  ALPRAZolam Duanne Moron) 0.5 MG tablet  06/11/14  Yes Historical Provider, MD  azaTHIOprine (IMURAN) 50 MG tablet TAKE 2 & 1/2 TABLETS DAILY. 05/11/14  Yes Mahala Menghini, PA-C  Calcium Carbonate-Vitamin D (CALCIUM 600+D PO) Take by mouth 2 (two) times daily.     Yes Historical Provider, MD  citalopram (CELEXA) 20 MG tablet Take 1 tablet (20 mg total) by mouth 2 (two) times daily. 06/19/10  Yes Mahala Menghini, PA-C  Cyanocobalamin (VITAMIN B-12 IJ) Inject as directed every 30 (thirty) days.     Yes Historical Provider, MD  cyclobenzaprine (FLEXERIL) 5 MG tablet Take 5 mg by mouth 3 (three) times daily.   Yes Historical Provider, MD  fish oil-omega-3 fatty acids 1000 MG capsule Take 2 g by mouth daily.     Yes Historical Provider, MD  lisinopril-hydrochlorothiazide (PRINZIDE,ZESTORETIC) 20-12.5 MG per tablet Take 1 tablet by mouth daily. 06/19/10  Yes Mahala Menghini, PA-C  Loratadine 10 MG CAPS Take 10 mg by mouth daily.    Yes Historical Provider, MD  multivitamin [facility]) per tablet Take  1 tablet by mouth daily.     Yes Historical Provider, MD  omeprazole (PRILOSEC) 20 MG capsule Take 20 mg by mouth daily.     Yes Historical Provider, MD  oxyCODONE (ROXICODONE) 15 MG immediate release tablet Take 15 mg by mouth every 4 (four) hours as needed.  11/12/10  Yes Historical Provider, MD  PENTASA 500 MG CR capsule TAKE (2) CAPSULES FOUR TIMES DAILY. 08/05/13  Yes Mahala Menghini, PA-C  Probiotic Product (RESTORA) CAPS Take 1 capsule by  mouth daily. 01/09/11  Yes Mahala Menghini, PA-C  promethazine (PHENERGAN) 12.5 MG tablet 1-2 po 6 h prn nausea or vomiting 02/10/13  Yes Danie Binder, MD  vitamin E 1000 UNIT capsule Take 1,000 Units by mouth daily.     Yes Historical Provider, MD  zinc gluconate 50 MG tablet Take 50 mg by mouth daily.     Yes Historical Provider, MD  baclofen (LIORESAL) 10 MG tablet Take 10 mg by mouth 3 (three) times daily.    Historical Provider, MD  hyoscyamine (LEVSIN SL) 0.125 MG SL tablet Place 1 tablet (0.125 mg total) under the tongue 4 (four) times daily -  before meals and at bedtime. Patient not taking: Reported on 06/21/2014 09/18/12   Daneil Dolin, MD  methocarbamol (ROBAXIN) 750 MG tablet Take 750 mg by mouth 4 (four) times daily as needed. 11/10/13   Historical Provider, MD  ondansetron (ZOFRAN) 4 MG tablet Take 4 mg by mouth. ONE BY MOUTH EVERY 4-6 HOURS AS NEEDED FOR NAUSEA AND VOMITING     Historical Provider, MD    Allergies as of 06/21/2014 - Review Complete 06/21/2014  Allergen Reaction Noted  . Codeine sulfate      Family History  Problem Relation Age of Onset  . Crohn's disease Mother     succumbed to stomach cancer in her 35s  . Colon cancer Neg Hx     History   Social History  . Marital Status: Legally Separated    Spouse Name: N/A  . Number of Children: 2  . Years of Education: N/A   Occupational History  . disability    Social History Main Topics  . Smoking status: Never Smoker   . Smokeless tobacco: Not on file  . Alcohol Use: Yes     Comment: frozen drinks here and there  . Drug Use: No  . Sexual Activity: Yes    Birth Control/ Protection: None   Other Topics Concern  . Not on file   Social History Narrative    Review of Systems: See HPI, otherwise negative ROS  Physical Exam: BP 135/75 mmHg  Pulse 92  Temp(Src) 98.4 F (36.9 C)  Ht 5' 10"  (1.778 m)  Wt 208 lb 12.8 oz (94.711 kg)  BMI 29.96 kg/m2 General:   Alert conversant, well-nourished,  pleasant and cooperative in NAD Skin:  Intact without significant lesions or rashes. Eyes:  Sclera clear, no icterus.   Conjunctiva pink. Ears:  Normal auditory acuity. Nose:  No deformity, discharge,  or lesions. Mouth:  No deformity or lesions. Neck:  Supple; no masses or thyromegaly. No significant cervical adenopathy. Lungs:  Clear throughout to auscultation.   No wheezes, crackles, or rhonchi. No acute distress. Heart:  Regular rate and rhythm; no murmurs, clicks, rubs,  or gallops. Abdomen: Non-distended, normal bowel sounds.  Soft and nontender without appreciable mass or hepatosplenomegaly.  Pulses:  Normal pulses noted. Extremities:  Without clubbing or edema. Rectal: Small external hemorrhoid tag. No  evidence of fistula. No mass the rectal vault. Scant brown stools Hemoccult positive    Impression:  64 year old lady with ileocolonic Crohn's disease status post ileocolonic partial resection years ago. On Imuran and Pentasa. I'm not convinced she is fully in remission at this time. She complains of hematochezia and is Hemoccult positive. We need to reassess the mucosal surfaces of her GI tract.  If she continues to have active disease, will consider changing her regimen to a biologic such as Humira  Recommendations:Schedule diagnostic colonoscopy to reassess Crohn's disease. Recent hematochezia. Abdominal pain  Split movie prep  Hepatic profile and CBC today  Further recommendations to follow.    Notice: This dictation was prepared with Dragon dictation along with smaller phrase technology. Any transcriptional errors that result from this process are unintentional and may not be corrected upon review.

## 2014-06-22 ENCOUNTER — Telehealth: Payer: Self-pay | Admitting: Gastroenterology

## 2014-06-22 LAB — HEPATIC FUNCTION PANEL
ALT: 22 U/L (ref 0–35)
AST: 18 U/L (ref 0–37)
Albumin: 3.9 g/dL (ref 3.5–5.2)
Alkaline Phosphatase: 69 U/L (ref 39–117)
BILIRUBIN INDIRECT: 0.9 mg/dL (ref 0.2–1.2)
BILIRUBIN TOTAL: 1.6 mg/dL — AB (ref 0.2–1.2)
Bilirubin, Direct: 0.7 mg/dL — ABNORMAL HIGH (ref 0.0–0.3)
TOTAL PROTEIN: 7.1 g/dL (ref 6.0–8.3)

## 2014-06-22 NOTE — Telephone Encounter (Signed)
Patient seen 5/10 by Dr. Gala Romney, and she had questions about the most recent blood work completed. I had requested a repeat CBC to be done around Jan 2016 due to mild normocytic anemia on labs in Nov 2015. This was repeated and abstracted into epic dated Jan 29. 2016. It appears that this was signed off on by me before filing a result note to contact patient. This was inadvertent, and I have attempted to call patient to let her know why she did not hear back about her labs. Left voicemail. Hgb 13.2, normal.   Orvil Feil, ANP-BC [facility]

## 2014-06-23 ENCOUNTER — Encounter (HOSPITAL_COMMUNITY)
Admission: RE | Disposition: A | Discharge status: Home / Self Care | Payer: Self-pay | Source: Ambulatory Visit | Attending: Internal Medicine

## 2014-06-23 ENCOUNTER — Ambulatory Visit (HOSPITAL_COMMUNITY)
Admission: RE | Admit: 2014-06-23 | Discharge: 2014-06-23 | Disposition: A | Discharge status: Home / Self Care | Payer: Medicare Other | Source: Ambulatory Visit | Attending: Internal Medicine | Admitting: Internal Medicine

## 2014-06-23 ENCOUNTER — Encounter (HOSPITAL_COMMUNITY): Payer: Self-pay | Admitting: *Deleted

## 2014-06-23 DIAGNOSIS — M8580 Other specified disorders of bone density and structure, unspecified site: Secondary | ICD-10-CM | POA: Insufficient documentation

## 2014-06-23 DIAGNOSIS — D649 Anemia, unspecified: Secondary | ICD-10-CM | POA: Diagnosis not present

## 2014-06-23 DIAGNOSIS — M797 Fibromyalgia: Secondary | ICD-10-CM | POA: Diagnosis not present

## 2014-06-23 DIAGNOSIS — K644 Residual hemorrhoidal skin tags: Secondary | ICD-10-CM | POA: Insufficient documentation

## 2014-06-23 DIAGNOSIS — K50919 Crohn's disease, unspecified, with unspecified complications: Secondary | ICD-10-CM | POA: Diagnosis not present

## 2014-06-23 DIAGNOSIS — K5660 Unspecified intestinal obstruction: Secondary | ICD-10-CM | POA: Insufficient documentation

## 2014-06-23 DIAGNOSIS — K5010 Crohn's disease of large intestine without complications: Secondary | ICD-10-CM | POA: Insufficient documentation

## 2014-06-23 DIAGNOSIS — K219 Gastro-esophageal reflux disease without esophagitis: Secondary | ICD-10-CM | POA: Insufficient documentation

## 2014-06-23 DIAGNOSIS — Z886 Allergy status to analgesic agent status: Secondary | ICD-10-CM | POA: Diagnosis not present

## 2014-06-23 DIAGNOSIS — I10 Essential (primary) hypertension: Secondary | ICD-10-CM | POA: Diagnosis not present

## 2014-06-23 DIAGNOSIS — K5730 Diverticulosis of large intestine without perforation or abscess without bleeding: Secondary | ICD-10-CM | POA: Diagnosis not present

## 2014-06-23 DIAGNOSIS — Z980 Intestinal bypass and anastomosis status: Secondary | ICD-10-CM | POA: Insufficient documentation

## 2014-06-23 DIAGNOSIS — K625 Hemorrhage of anus and rectum: Secondary | ICD-10-CM | POA: Diagnosis present

## 2014-06-23 DIAGNOSIS — Z9049 Acquired absence of other specified parts of digestive tract: Secondary | ICD-10-CM | POA: Diagnosis not present

## 2014-06-23 DIAGNOSIS — K5090 Crohn's disease, unspecified, without complications: Secondary | ICD-10-CM | POA: Diagnosis present

## 2014-06-23 HISTORY — PX: COLONOSCOPY: SHX5424

## 2014-06-23 SURGERY — COLONOSCOPY
Anesthesia: Moderate Sedation

## 2014-06-23 MED ORDER — ONDANSETRON HCL 4 MG/2ML IJ SOLN
INTRAMUSCULAR | Status: AC
  Filled 2014-06-23: qty 2

## 2014-06-23 MED ORDER — MEPERIDINE HCL 100 MG/ML IJ SOLN
INTRAMUSCULAR | Status: DC | PRN
  Administered 2014-06-23: 50 mg via INTRAVENOUS
  Administered 2014-06-23 (×2): 25 mg via INTRAVENOUS

## 2014-06-23 MED ORDER — SODIUM CHLORIDE 0.9 % IV SOLN
INTRAVENOUS | Status: DC
  Administered 2014-06-23: 1000 mL via INTRAVENOUS

## 2014-06-23 MED ORDER — MEPERIDINE HCL 100 MG/ML IJ SOLN
INTRAMUSCULAR | Status: AC
  Filled 2014-06-23: qty 2

## 2014-06-23 MED ORDER — STERILE WATER FOR IRRIGATION IR SOLN
Status: DC | PRN
  Administered 2014-06-23: 11:00:00

## 2014-06-23 MED ORDER — ONDANSETRON HCL 4 MG/2ML IJ SOLN
INTRAMUSCULAR | Status: DC | PRN
  Administered 2014-06-23: 4 mg via INTRAVENOUS

## 2014-06-23 MED ORDER — MIDAZOLAM HCL 5 MG/5ML IJ SOLN
INTRAMUSCULAR | Status: AC
  Filled 2014-06-23: qty 10

## 2014-06-23 MED ORDER — SIMETHICONE 40 MG/0.6ML PO SUSP
ORAL | Status: AC
  Filled 2014-06-23: qty 0.6

## 2014-06-23 MED ORDER — MIDAZOLAM HCL 5 MG/5ML IJ SOLN
INTRAMUSCULAR | Status: DC | PRN
  Administered 2014-06-23 (×3): 1 mg via INTRAVENOUS
  Administered 2014-06-23 (×2): 2 mg via INTRAVENOUS

## 2014-06-23 NOTE — Op Note (Signed)
[place], [id number]   COLONOSCOPY PROCEDURE REPORT  PATIENT: Sheri [name], Sheri [name]  MR#: [id number] BIRTHDATE: 26-Aug-1950 , 23  yrs. old GENDER: female ENDOSCOPIST: R.  Garfield Cornea, MD FACP [facility] REFERRED WU:[id number] Manuella Ghazi, M.D. PROCEDURE DATE:  2014-06-28 PROCEDURE:   Ileo-colonoscopy with biopsy INDICATIONS:history of ileocolonic Crohn's disease; intermittent rectal bleeding and diarrhea. MEDICATIONS: Versed 7 mg IV and Demerol 125 mg IV in divided doses. Zofran 4 mg IV. ASA CLASS:       Class II  CONSENT: The risks, benefits, alternatives and imponderables including but not limited to bleeding, perforation as well as the possibility of a missed lesion have been reviewed.  The potential for biopsy, lesion removal, etc. have also been discussed. Questions have been answered.  All parties agreeable.  Please see the history and physical in the medical record for more information.  DESCRIPTION OF PROCEDURE:   After the risks benefits and alternatives of the procedure were thoroughly explained, informed consent was obtained.  The digital rectal exam      The EC-[id number] ([id number])  endoscope was introduced through the anus and advanced to the surgical anastomosis/ terminal ileum which was intubated for a short distance. No adverse events experienced.   The quality of the prep was adequate  The instrument was then slowly withdrawn as the colon was fully examined.      COLON FINDINGS: Single external hemorrhoid tag; otherwise normal appearing rectal mucosa.  Scattered narrow mouth left-sided diverticula; surgical anastomosis with the small bowel identified. Anastomosis was friable; it appeared stenotic over a 2 cm length. /stenotic at the anastomosis;  I was able to negotiate the adult colonoscope across it which opened almost immediately into a somewhat dilated but otherwise normal-appearing neoterminal ileum. Multiple biopsies of the abnormal  anastomosis taken. Retroflexion was performed. .  Withdrawal time=not applicable.     .  The scope was withdrawn and the procedure completed. COMPLICATIONS: There were no immediate complications.  ENDOSCOPIC IMPRESSION: Friable eroded mucosa at the anastomosis with small bowel consistent with some fiber stenosing Crohn's disease status post biopsy. Colonic diverticulosis. External hemorrhoids.  RECOMMENDATIONS: Follow-up on pathology.  eSigned:  R. Garfield Cornea, MD [facility] Jun 28, 2014 11:52 AM   cc:  CPT CODES: ICD CODES:  The ICD and CPT codes recommended by this software are interpretations from the data that the clinical staff has captured with the software.  The verification of the translation of this report to the ICD and CPT codes and modifiers is the sole responsibility of the health care institution and practicing physician where this report was generated.  [employer] will not be held responsible for the validity of the ICD and CPT codes included on this report.  AMA assumes no liability for data contained or not contained herein. CPT is a Designer, television/film set of the [employer].

## 2014-06-23 NOTE — Interval H&P Note (Signed)
History and Physical Interval Note:  06/23/2014 10:59 AM  Sheri [name]  has presented today for surgery, with the diagnosis of reassess crohn's disease  The various methods of treatment have been discussed with the patient and family. After consideration of risks, benefits and other options for treatment, the patient has consented to  Procedure(s) with comments: COLONOSCOPY (N/A) - 2:00 - moved to 10:15 - office notified pt as a surgical intervention .  The patient's history has been reviewed, patient examined, no change in status, stable for surgery.  I have reviewed the patient's chart and labs.  Questions were answered to the patient's satisfaction.     Biagio Snelson  No change. Colonoscopy per plan.The risks, benefits, limitations, alternatives and imponderables have been reviewed with the patient. Questions have been answered. All parties are agreeable.

## 2014-06-23 NOTE — H&P (View-Only) (Signed)
Primary Care Physician:  Monico Blitz, MD Primary Gastroenterologist:  Dr. Gala Romney  Pre-Procedure History & Physical: HPI:  Sheri [name] is a 64 y.o. female here for followup of ileocolonic Crohn's disease. Has intermittent crampy abdominal pain since her last visit. Has determined gross blood per rectum just this morning really hasn't had any trouble since her last office visit otherwise. Has tendency towards nonbloody diarrhea. On Imuran and Pentasa. Patient reminded me that many years ago in [place] she tried Remicade which did not really work. Mild anemia noted last year with followup H&H in March of the normal. Mildly elevated bilirubin last year as well.  Prior CT demonstrated no evidence of perirectal fistula/abscess.  Patient stopped taking Fosamax as her DEXA scan came back reportedly normal. She continues to take vitamin B12 injections twice monthly.  Past Medical History  Diagnosis Date  . Crohn disease   . Anemia   . Low back pain   . Fibromyalgia   . Osteopenia     Last DEXA 12/2010 was normal, on fosamax  . Complication of anesthesia   . PONV (postoperative nausea and vomiting)   . GERD (gastroesophageal reflux disease)   . Hypertension   . Chronic back pain   . Chronic neck pain     Past Surgical History  Procedure Laterality Date  . Small intestine surgery  [id number]  . Cesarean section  [id number]  . Appendectomy  1990  . Drainage of back abscess  2007  . Colonoscopy  09/11/2006  . Esophagogastroduodenoscopy  04/2009    noncritical schatzki's ring, small hh, sb bx negative  . Colonoscopy  10/30/2010    RMR: External and internal hemorrhoids, likely source of hematochezia  otherwise normal rectum/ Left-sided diverticula, status post prior right hemicolectomy with a friable, inflamed, stenotic surgical anastomosis with upstream dilation of the neoterminal ileum/ Crohn's noted, status post biopsy  . Cataract extraction w/phaco Left 01/13/2014    Procedure: CATARACT  EXTRACTION PHACO AND INTRAOCULAR LENS PLACEMENT LEFT EYE;  Surgeon: Tonny Branch, MD;  Location: [facility];  Service: Ophthalmology;  Laterality: Left;  CDE:6.90  . Cataract extraction w/phaco Right 01/24/2014    Procedure: CATARACT EXTRACTION PHACO AND INTRAOCULAR LENS PLACEMENT (IOC);  Surgeon: Tonny Branch, MD;  Location: [facility];  Service: Ophthalmology;  Laterality: Right;  CDE:5.91    Prior to Admission medications   Medication Sig Start Date End Date Taking? Authorizing Provider  ALPRAZolam Duanne Moron) 0.5 MG tablet  06/11/14  Yes Historical Provider, MD  azaTHIOprine (IMURAN) 50 MG tablet TAKE 2 & 1/2 TABLETS DAILY. 05/11/14  Yes Mahala Menghini, PA-C  Calcium Carbonate-Vitamin D (CALCIUM 600+D PO) Take by mouth 2 (two) times daily.     Yes Historical Provider, MD  citalopram (CELEXA) 20 MG tablet Take 1 tablet (20 mg total) by mouth 2 (two) times daily. 06/19/10  Yes Mahala Menghini, PA-C  Cyanocobalamin (VITAMIN B-12 IJ) Inject as directed every 30 (thirty) days.     Yes Historical Provider, MD  cyclobenzaprine (FLEXERIL) 5 MG tablet Take 5 mg by mouth 3 (three) times daily.   Yes Historical Provider, MD  fish oil-omega-3 fatty acids 1000 MG capsule Take 2 g by mouth daily.     Yes Historical Provider, MD  lisinopril-hydrochlorothiazide (PRINZIDE,ZESTORETIC) 20-12.5 MG per tablet Take 1 tablet by mouth daily. 06/19/10  Yes Mahala Menghini, PA-C  Loratadine 10 MG CAPS Take 10 mg by mouth daily.    Yes Historical Provider, MD  multivitamin [facility]) per tablet Take  1 tablet by mouth daily.     Yes Historical Provider, MD  omeprazole (PRILOSEC) 20 MG capsule Take 20 mg by mouth daily.     Yes Historical Provider, MD  oxyCODONE (ROXICODONE) 15 MG immediate release tablet Take 15 mg by mouth every 4 (four) hours as needed.  11/12/10  Yes Historical Provider, MD  PENTASA 500 MG CR capsule TAKE (2) CAPSULES FOUR TIMES DAILY. 08/05/13  Yes Mahala Menghini, PA-C  Probiotic Product (RESTORA) CAPS Take 1 capsule by  mouth daily. 01/09/11  Yes Mahala Menghini, PA-C  promethazine (PHENERGAN) 12.5 MG tablet 1-2 po 6 h prn nausea or vomiting 02/10/13  Yes Danie Binder, MD  vitamin E 1000 UNIT capsule Take 1,000 Units by mouth daily.     Yes Historical Provider, MD  zinc gluconate 50 MG tablet Take 50 mg by mouth daily.     Yes Historical Provider, MD  baclofen (LIORESAL) 10 MG tablet Take 10 mg by mouth 3 (three) times daily.    Historical Provider, MD  hyoscyamine (LEVSIN SL) 0.125 MG SL tablet Place 1 tablet (0.125 mg total) under the tongue 4 (four) times daily -  before meals and at bedtime. Patient not taking: Reported on 06/21/2014 09/18/12   Daneil Dolin, MD  methocarbamol (ROBAXIN) 750 MG tablet Take 750 mg by mouth 4 (four) times daily as needed. 11/10/13   Historical Provider, MD  ondansetron (ZOFRAN) 4 MG tablet Take 4 mg by mouth. ONE BY MOUTH EVERY 4-6 HOURS AS NEEDED FOR NAUSEA AND VOMITING     Historical Provider, MD    Allergies as of 06/21/2014 - Review Complete 06/21/2014  Allergen Reaction Noted  . Codeine sulfate      Family History  Problem Relation Age of Onset  . Crohn's disease Mother     succumbed to stomach cancer in her 73s  . Colon cancer Neg Hx     History   Social History  . Marital Status: Legally Separated    Spouse Name: N/A  . Number of Children: 2  . Years of Education: N/A   Occupational History  . disability    Social History Main Topics  . Smoking status: Never Smoker   . Smokeless tobacco: Not on file  . Alcohol Use: Yes     Comment: frozen drinks here and there  . Drug Use: No  . Sexual Activity: Yes    Birth Control/ Protection: None   Other Topics Concern  . Not on file   Social History Narrative    Review of Systems: See HPI, otherwise negative ROS  Physical Exam: BP 135/75 mmHg  Pulse 92  Temp(Src) 98.4 F (36.9 C)  Ht 5' 10"  (1.778 m)  Wt 208 lb 12.8 oz (94.711 kg)  BMI 29.96 kg/m2 General:   Alert conversant, well-nourished,  pleasant and cooperative in NAD Skin:  Intact without significant lesions or rashes. Eyes:  Sclera clear, no icterus.   Conjunctiva pink. Ears:  Normal auditory acuity. Nose:  No deformity, discharge,  or lesions. Mouth:  No deformity or lesions. Neck:  Supple; no masses or thyromegaly. No significant cervical adenopathy. Lungs:  Clear throughout to auscultation.   No wheezes, crackles, or rhonchi. No acute distress. Heart:  Regular rate and rhythm; no murmurs, clicks, rubs,  or gallops. Abdomen: Non-distended, normal bowel sounds.  Soft and nontender without appreciable mass or hepatosplenomegaly.  Pulses:  Normal pulses noted. Extremities:  Without clubbing or edema. Rectal: Small external hemorrhoid tag. No  evidence of fistula. No mass the rectal vault. Scant brown stools Hemoccult positive    Impression:  64 year old lady with ileocolonic Crohn's disease status post ileocolonic partial resection years ago. On Imuran and Pentasa. I'm not convinced she is fully in remission at this time. She complains of hematochezia and is Hemoccult positive. We need to reassess the mucosal surfaces of her GI tract.  If she continues to have active disease, will consider changing her regimen to a biologic such as Humira  Recommendations:Schedule diagnostic colonoscopy to reassess Crohn's disease. Recent hematochezia. Abdominal pain  Split movie prep  Hepatic profile and CBC today  Further recommendations to follow.    Notice: This dictation was prepared with Dragon dictation along with smaller phrase technology. Any transcriptional errors that result from this process are unintentional and may not be corrected upon review.

## 2014-06-23 NOTE — Discharge Instructions (Signed)
Colonoscopy Discharge Instructions  Read the instructions outlined below and refer to this sheet in the next few weeks. These discharge instructions provide you with general information on caring for yourself after you leave the hospital. Your doctor may also give you specific instructions. While your treatment has been planned according to the most current medical practices available, unavoidable complications occasionally occur. If you have any problems or questions after discharge, call Dr. Gala Romney at [phone]. ACTIVITY  You may resume your regular activity, but move at a slower pace for the next 24 hours.   Take frequent rest periods for the next 24 hours.   Walking will help get rid of the air and reduce the bloated feeling in your belly (abdomen).   No driving for 24 hours (because of the medicine (anesthesia) used during the test).    Do not sign any important legal documents or operate any machinery for 24 hours (because of the anesthesia used during the test).  NUTRITION  Drink plenty of fluids.   You may resume your normal diet as instructed by your doctor.   Begin with a light meal and progress to your normal diet. Heavy or fried foods are harder to digest and may make you feel sick to your stomach (nauseated).   Avoid alcoholic beverages for 24 hours or as instructed.  MEDICATIONS  You may resume your normal medications unless your doctor tells you otherwise.  WHAT YOU CAN EXPECT TODAY  Some feelings of bloating in the abdomen.   Passage of more gas than usual.   Spotting of blood in your stool or on the toilet paper.  IF YOU HAD POLYPS REMOVED DURING THE COLONOSCOPY:  No aspirin products for 7 days or as instructed.   No alcohol for 7 days or as instructed.   Eat a soft diet for the next 24 hours.  FINDING OUT THE RESULTS OF YOUR TEST Not all test results are available during your visit. If your test results are not back during the visit, make an appointment  with your caregiver to find out the results. Do not assume everything is normal if you have not heard from your caregiver or the medical facility. It is important for you to follow up on all of your test results.  SEEK IMMEDIATE MEDICAL ATTENTION IF:  You have more than a spotting of blood in your stool.   Your belly is swollen (abdominal distention).   You are nauseated or vomiting.   You have a temperature over 101.   You have abdominal pain or discomfort that is severe or gets worse throughout the day.   Information on Crohn's disease and diverticulosis provided  Further recommendations to follow pending review of pathology report  Diverticulosis Diverticulosis is the condition that develops when small pouches (diverticula) form in the wall of your colon. Your colon, or large intestine, is where water is absorbed and stool is formed. The pouches form when the inside layer of your colon pushes through weak spots in the outer layers of your colon. CAUSES  No one knows exactly what causes diverticulosis. RISK FACTORS  Being older than 64. Your risk for this condition increases with age. Diverticulosis is rare in people younger than 40 years. By age 74, almost everyone has it.  Eating a low-fiber diet.  Being frequently constipated.  Being overweight.  Not getting enough exercise.  Smoking.  Taking over-the-counter pain medicines, like aspirin and ibuprofen. SYMPTOMS  Most people with diverticulosis do not have symptoms. DIAGNOSIS  Because diverticulosis often has no symptoms, health care providers often discover the condition during an exam for other colon problems. In many cases, a health care provider will diagnose diverticulosis while using a flexible scope to examine the colon (colonoscopy). TREATMENT  If you have never developed an infection related to diverticulosis, you may not need treatment. If you have had an infection before, treatment may include:  Eating more  fruits, vegetables, and grains.  Taking a fiber supplement.  Taking a live bacteria supplement (probiotic).  Taking medicine to relax your colon. HOME CARE INSTRUCTIONS   Drink at least 6-8 glasses of water each day to prevent constipation.  Try not to strain when you have a bowel movement.  Keep all follow-up appointments. If you have had an infection before:  Increase the fiber in your diet as directed by your health care provider or dietitian.  Take a dietary fiber supplement if your health care provider approves.  Only take medicines as directed by your health care provider. SEEK MEDICAL CARE IF:   You have abdominal pain.  You have bloating.  You have cramps.  You have not gone to the bathroom in 3 days. SEEK IMMEDIATE MEDICAL CARE IF:   Your pain gets worse.  Yourbloating becomes very bad.  You have a fever or chills, and your symptoms suddenly get worse.  You begin vomiting.  You have bowel movements that are bloody or black. MAKE SURE YOU:  Understand these instructions.  Will watch your condition.  Will get help right away if you are not doing well or get worse. Document Released: 10/26/2003 Document Revised: 02/02/2013 Document Reviewed: 12/23/2012 [facility] Patient Information 2015 [place], [place]. This information is not intended to replace advice given to you by your health care provider. Make sure you discuss any questions you have with your health care provider. Crohn Disease Crohn disease is a long-term (chronic) soreness and redness (inflammation) of the intestines (bowel). It can affect any portion of the digestive tract, from the mouth to the anus. It can also cause problems outside the digestive tract. Crohn disease is closely related to a disease called ulcerative colitis (together, these two diseases are called inflammatory bowel disease).  CAUSES  The cause of Crohn disease is not known. [employer]uffer is that, in an easily affected person, the  immune system is triggered to attack the body's own digestive tissue. Crohn disease runs in families. It seems to be more common in certain geographic areas and amongst certain races. There are no clear-cut dietary causes.  SYMPTOMS  Crohn disease can cause many different symptoms since it can affect many different parts of the body. Symptoms include:  Fatigue.  Weight loss.  Chronic diarrhea, sometime bloody.  Abdominal pain and cramps.  Fever.  Ulcers or canker sores in the mouth or rectum.  Anemia (low red blood cells).  Arthritis, skin problems, and eye problems may occur. Complications of Crohn disease can include:  Series of holes (perforation) of the bowel.  Portions of the intestines sticking to each other (adhesions).  Obstruction of the bowel.  Fistula formation, typically in the rectal area but also in other areas. A fistula is an opening between the bowels and the outside, or between the bowels and another organ.  A painful crack in the mucous membrane of the anus (rectal fissure). DIAGNOSIS  Your caregiver may suspect Crohn disease based on your symptoms and an exam. Blood tests may confirm that there is a problem. You may be asked to  submit a stool specimen for examination. X-rays and CT scans may be necessary. Ultimately, the diagnosis is usually made after a procedure that uses a flexible tube that is inserted via your mouth or your anus. This is done under sedation and is called either an upper endoscopy or colonoscopy. With these tests, the specialist can take tiny tissue samples and remove them from the inside of the bowel (biopsy). Examination of this biopsy tissue under a microscope can reveal Crohn disease as the cause of your symptoms. Due to the many different forms that Crohn disease can take, symptoms may be present for several years before a diagnosis is made. TREATMENT  Medications are often used to decrease inflammation and control the immune system.  These include medicines related to aspirin, steroid medications, and newer and stronger medications to slow down the immune system. Some medications may be used as suppositories or enemas. A number of other medications are used or have been studied. Your caregiver will make specific recommendations. HOME CARE INSTRUCTIONS   Symptoms such as diarrhea can be controlled with medications. Avoid foods that have a laxative effect such as fresh fruit, vegetables, and dairy products. During flare-ups, you can rest your bowel by refraining from solid foods. Drink clear liquids frequently during the day. (Electrolyte or rehydrating fluids are best. Your caregiver can help you with suggestions.) Drink often to prevent loss of body fluids (dehydration). When diarrhea has cleared, eat small meals and more frequently. Avoid food additives and stimulants such as caffeine (coffee, tea, or chocolate). Enzyme supplements may help if you develop intolerance to a sugar in dairy products (lactose). Ask your caregiver or dietitian about specific dietary instructions.  Try to maintain a positive attitude. Learn relaxation techniques such as self-hypnosis, mental imaging, and muscle relaxation.  If possible, avoid stresses which can aggravate your condition.  Exercise regularly.  Follow your diet.  Always get plenty of rest. SEEK MEDICAL CARE IF:   Your symptoms fail to improve after a week or two of new treatment.  You experience continued weight loss.  You have ongoing cramps or loose bowels.  You develop a new skin rash, skin sores, or eye problems. SEEK IMMEDIATE MEDICAL CARE IF:   You have worsening of your symptoms or develop new symptoms.  You have a fever.  You develop bloody diarrhea.  You develop severe abdominal pain. MAKE SURE YOU:   Understand these instructions.  Will watch your condition.  Will get help right away if you are not doing well or get worse. Document Released: 11/07/2004  Document Revised: 06/14/2013 Document Reviewed: 10/06/2006 [facility] Patient Information 2015 [place], [place]. This information is not intended to replace advice given to you by your health care provider. Make sure you discuss any questions you have with your health care provider.

## 2014-06-24 ENCOUNTER — Encounter (HOSPITAL_COMMUNITY): Payer: Self-pay | Admitting: Internal Medicine

## 2014-06-30 ENCOUNTER — Encounter: Payer: Self-pay | Admitting: Internal Medicine

## 2014-07-12 ENCOUNTER — Encounter: Payer: Self-pay | Admitting: Gastroenterology

## 2014-08-08 ENCOUNTER — Encounter: Payer: Self-pay | Admitting: Gastroenterology

## 2014-08-08 ENCOUNTER — Ambulatory Visit (INDEPENDENT_AMBULATORY_CARE_PROVIDER_SITE_OTHER): Payer: Medicare Other | Admitting: Gastroenterology

## 2014-08-08 VITALS — BP 141/78 | HR 92 | Temp 97.6°F | Ht 70.0 in | Wt 207.8 lb

## 2014-08-08 DIAGNOSIS — K5080 Crohn's disease of both small and large intestine without complications: Secondary | ICD-10-CM

## 2014-08-08 MED ORDER — AZATHIOPRINE 50 MG PO TABS: ORAL_TABLET | ORAL | Status: DC

## 2014-08-08 MED ORDER — MESALAMINE ER 500 MG PO CPCR: ORAL_CAPSULE | ORAL | Status: DC

## 2014-08-08 NOTE — Patient Instructions (Signed)
We will repeat your labs in August.   We will see you back in 6 months!!!

## 2014-08-09 ENCOUNTER — Telehealth: Payer: Self-pay | Admitting: Internal Medicine

## 2014-08-09 ENCOUNTER — Encounter: Payer: Self-pay | Admitting: Gastroenterology

## 2014-08-09 NOTE — Assessment & Plan Note (Signed)
Pentasa and Imuran 125 mg daily, doing well, with recent colonoscopy completed without concerning findings. Will repeat CBC, CMP in Aug 2016, return for office visit in 6 months.

## 2014-08-09 NOTE — Progress Notes (Signed)
cc'ed to pcp °

## 2014-08-09 NOTE — Progress Notes (Signed)
Referring Provider: Monico Blitz, MD Primary Care Physician:  Monico Blitz, MD  Primary GI: Dr. Gala Romney   Chief Complaint  Patient presents with  . Follow-up    HPI:   Sheri [name] is a 64 y.o. 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 Interesting symptoms of perirectal discomfort in past, with CTE and subsequent MRI raising question of possible fistula in the past but no current inflammatory issues or active fistula/abscess. Due to recent rectal bleeding in May 2016, underwent updated colonoscopy. Noting  friable eroded mucosa at the anastomosis with small bowel, consistent with some stenosis Crohn's disease s/p biopsy. Colonic diverticulosis and external hemorrhoids. Pathology with benign anastomotic mucosa, no dysplasia. Discussion of Humira briefly raised during last appt prior to colonoscopy, but due to benign pathology, decision was made to continue Imuran and Pentasa.   Notes previously some mild fecal seepage, now improved. No rectal bleeding. At baseline for bowel habits. No abdominal pain. No other complaints. Will need labs in August 2016.    Past Medical History  Diagnosis Date  . Crohn disease   . Anemia   . Low back pain   . Fibromyalgia   . Osteopenia     Last DEXA 12/2010 was normal, on fosamax  . Complication of anesthesia   . PONV (postoperative nausea and vomiting)   . GERD (gastroesophageal reflux disease)   . Hypertension   . Chronic back pain   . Chronic neck pain     Past Surgical History  Procedure Laterality Date  . Small intestine surgery  [id number]  . Cesarean section  [id number]  . Appendectomy  1990  . Drainage of back abscess  2007  . Colonoscopy  09/11/2006  . Esophagogastroduodenoscopy  04/2009    noncritical schatzki's ring, small hh, sb bx negative  . Colonoscopy  10/30/2010    RMR: External and internal hemorrhoids, likely source of hematochezia  otherwise normal rectum/ Left-sided  diverticula, status post prior right hemicolectomy with a friable, inflamed, stenotic surgical anastomosis with upstream dilation of the neoterminal ileum/ Crohn's noted, status post biopsy  . Cataract extraction w/phaco Left 01/13/2014    Procedure: CATARACT EXTRACTION PHACO AND INTRAOCULAR LENS PLACEMENT LEFT EYE;  Surgeon: Tonny Branch, MD;  Location: [facility];  Service: Ophthalmology;  Laterality: Left;  CDE:6.90  . Cataract extraction w/phaco Right 01/24/2014    Procedure: CATARACT EXTRACTION PHACO AND INTRAOCULAR LENS PLACEMENT (IOC);  Surgeon: Tonny Branch, MD;  Location: [facility];  Service: Ophthalmology;  Laterality: Right;  CDE:5.91  . Colonoscopy N/A 06/23/2014    Dr. Rourk:friable eroded mucosa at the anastomosis with small bowel, consistent with some stenosis Crohn's disease s/p biopsy. Colonic diverticulosis and external hemorrhoids. Pathology with benign anastomotic mucosa, no dysplasia.     Current Outpatient Prescriptions  Medication Sig Dispense Refill  . ALPRAZolam (XANAX) 0.5 MG tablet     . azaTHIOprine (IMURAN) 50 MG tablet TAKE 2 & 1/2 TABLETS DAILY. 75 tablet 11  . baclofen (LIORESAL) 10 MG tablet Take 10 mg by mouth 3 (three) times daily.    . Calcium Carbonate-Vitamin D (CALCIUM 600+D PO) Take by mouth 2 (two) times daily.      . citalopram (CELEXA) 20 MG tablet Take 1 tablet (20 mg total) by mouth 2 (two) times daily. 60 tablet 0  . Cyanocobalamin (VITAMIN B-12 IJ) Inject as directed every 30 (thirty) days.      . cyclobenzaprine (FLEXERIL) 5 MG tablet Take 5  mg by mouth 3 (three) times daily.    . fish oil-omega-3 fatty acids 1000 MG capsule Take 2 g by mouth daily.      . hyoscyamine (LEVSIN SL) 0.125 MG SL tablet Place 1 tablet (0.125 mg total) under the tongue 4 (four) times daily -  before meals and at bedtime. 60 tablet 3  . lisinopril-hydrochlorothiazide (PRINZIDE,ZESTORETIC) 20-12.5 MG per tablet Take 1 tablet by mouth daily. 30 tablet 0  . Loratadine 10 MG CAPS Take 10  mg by mouth daily.     . mesalamine (PENTASA) 500 MG CR capsule TAKE (2) CAPSULES FOUR TIMES DAILY. 240 capsule 11  . multivitamin (THERAGRAN) per tablet Take 1 tablet by mouth daily.      Marland Kitchen omeprazole (PRILOSEC) 20 MG capsule Take 20 mg by mouth daily.      . ondansetron (ZOFRAN) 4 MG tablet Take 4 mg by mouth. ONE BY MOUTH EVERY 4-6 HOURS AS NEEDED FOR NAUSEA AND VOMITING     . oxyCODONE (ROXICODONE) 15 MG immediate release tablet Take 15 mg by mouth every 4 (four) hours as needed.     . Probiotic Product (RESTORA) CAPS Take 1 capsule by mouth daily. 28 capsule 0  . promethazine (PHENERGAN) 12.5 MG tablet 1-2 po 6 h prn nausea or vomiting 40 tablet 0  . vitamin E 1000 UNIT capsule Take 1,000 Units by mouth daily.      Marland Kitchen zinc gluconate 50 MG tablet Take 50 mg by mouth daily.       No current facility-administered medications for this visit.    Allergies as of 08/08/2014 - Review Complete 06/23/2014  Allergen Reaction Noted  . Codeine sulfate      Family History  Problem Relation Age of Onset  . Crohn's disease Mother     succumbed to stomach cancer in her 91s  . Colon cancer Neg Hx     History   Social History  . Marital Status: Legally Separated    Spouse Name: N/A  . Number of Children: 2  . Years of Education: N/A   Occupational History  . disability    Social History Main Topics  . Smoking status: Never Smoker   . Smokeless tobacco: Not on file  . Alcohol Use: Yes     Comment: frozen drinks here and there  . Drug Use: No  . Sexual Activity: Yes    Birth Control/ Protection: None   Other Topics Concern  . None   Social History Narrative    Review of Systems: As mentioned in HPI  Physical Exam: BP 141/78 mmHg  Pulse 92  Temp(Src) 97.6 F (36.4 C) (Oral)  Ht 5' 10"  (1.778 m)  Wt 207 lb 12.8 oz (94.257 kg)  BMI 29.82 kg/m2 General:   Alert and oriented. No distress noted. Pleasant and cooperative.  Head:  Normocephalic and atraumatic. Eyes:   Conjuctiva clear without scleral icterus. Mouth:  Oral mucosa pink and moist. Good dentition. No lesions. Abdomen:  +BS, soft, non-tender and non-distended. No rebound or guarding. No HSM or masses noted. Msk:  Symmetrical without gross deformities. Normal posture. Extremities:  Without edema. Neurologic:  Alert and  oriented x4;  grossly normal neurologically. Psych:  Alert and cooperative. Normal mood and affect.'

## 2014-08-09 NOTE — Telephone Encounter (Signed)
i have already run the August recall and this patient will need labs done in August. I can't NIC it to the recall list for August unless I re-run the whole list over. This is a reminder.

## 2014-08-09 NOTE — Telephone Encounter (Signed)
i have put the reminder in the lab box

## 2014-08-29 ENCOUNTER — Other Ambulatory Visit: Payer: Self-pay

## 2014-08-29 DIAGNOSIS — K50019 Crohn's disease of small intestine with unspecified complications: Secondary | ICD-10-CM

## 2014-08-29 DIAGNOSIS — K50919 Crohn's disease, unspecified, with unspecified complications: Secondary | ICD-10-CM

## 2014-08-29 DIAGNOSIS — D649 Anemia, unspecified: Secondary | ICD-10-CM

## 2014-10-06 LAB — COMPLETE METABOLIC PANEL WITH GFR
ALK PHOS: 86 U/L (ref 33–130)
ALT: 22 U/L (ref 6–29)
AST: 21 U/L (ref 10–35)
Albumin: 4.1 g/dL (ref 3.6–5.1)
BILIRUBIN TOTAL: 1.6 mg/dL — AB (ref 0.2–1.2)
BUN: 13 mg/dL (ref 7–25)
CALCIUM: 9.2 mg/dL (ref 8.6–10.4)
CO2: 28 mmol/L (ref 20–31)
CREATININE: 0.57 mg/dL (ref 0.50–0.99)
Chloride: 103 mmol/L (ref 98–110)
GFR, Est African American: >89 mL/min (ref >=60)
GFR, Est Non African American: >89 mL/min (ref >=60)
Glucose, Bld: 83 mg/dL (ref 65–99)
Potassium: 4.1 mmol/L (ref 3.5–5.3)
Sodium: 139 mmol/L (ref 135–146)
TOTAL PROTEIN: 6.5 g/dL (ref 6.1–8.1)

## 2014-10-06 LAB — CBC WITH DIFFERENTIAL/PLATELET
BASOS ABS: 0 10*3/uL (ref 0.0–0.1)
Basophils Relative: 0 % (ref 0–1)
Eosinophils Absolute: 0.1 10*3/uL (ref 0.0–0.7)
Eosinophils Relative: 2 % (ref 0–5)
HEMATOCRIT: 35 % — AB (ref 36.0–46.0)
HEMOGLOBIN: 12.2 g/dL (ref 12.0–15.0)
LYMPHS ABS: 1.4 10*3/uL (ref 0.7–4.0)
LYMPHS PCT: 23 % (ref 12–46)
MCH: 35.1 pg — ABNORMAL HIGH (ref 26.0–34.0)
MCHC: 34.9 g/dL (ref 30.0–36.0)
MCV: 100.6 fL — ABNORMAL HIGH (ref 78.0–100.0)
MPV: 9.9 fL (ref 8.6–12.4)
Monocytes Absolute: 0.7 10*3/uL (ref 0.1–1.0)
Monocytes Relative: 11 % (ref 3–12)
Neutro Abs: 3.8 10*3/uL (ref 1.7–7.7)
Neutrophils Relative %: 64 % (ref 43–77)
Platelets: 245 10*3/uL (ref 150–400)
RBC: 3.48 MIL/uL — AB (ref 3.87–5.11)
RDW: 14.9 % (ref 11.5–15.5)
WBC: 6 10*3/uL (ref 4.0–10.5)

## 2014-10-20 NOTE — Progress Notes (Signed)
Quick Note:  Mild non-specific elevation of Tbili, at baseline.  I would like to recheck CBC in 6 weeks. She has had a slight drift in H/H (very very small) but a small bump in MCV. Likely non-specific. ______

## 2014-11-01 ENCOUNTER — Other Ambulatory Visit: Payer: Self-pay | Admitting: Gastroenterology

## 2014-11-01 DIAGNOSIS — D649 Anemia, unspecified: Secondary | ICD-10-CM

## 2014-11-18 ENCOUNTER — Other Ambulatory Visit: Payer: Self-pay

## 2014-11-18 DIAGNOSIS — D649 Anemia, unspecified: Secondary | ICD-10-CM

## 2014-12-02 LAB — CBC WITH DIFFERENTIAL/PLATELET
Basophils Absolute: 0 10*3/uL (ref 0.0–0.1)
Basophils Relative: 0 % (ref 0–1)
EOS PCT: 2 % (ref 0–5)
Eosinophils Absolute: 0.1 10*3/uL (ref 0.0–0.7)
HEMATOCRIT: 34.6 % — AB (ref 36.0–46.0)
Hemoglobin: 11.8 g/dL — ABNORMAL LOW (ref 12.0–15.0)
Lymphocytes Relative: 32 % (ref 12–46)
Lymphs Abs: 1.9 10*3/uL (ref 0.7–4.0)
MCH: 33.9 pg (ref 26.0–34.0)
MCHC: 34.1 g/dL (ref 30.0–36.0)
MCV: 99.4 fL (ref 78.0–100.0)
MONO ABS: 0.5 10*3/uL (ref 0.1–1.0)
MONOS PCT: 9 % (ref 3–12)
MPV: 9.7 fL (ref 8.6–12.4)
NEUTROS ABS: 3.4 10*3/uL (ref 1.7–7.7)
Neutrophils Relative %: 57 % (ref 43–77)
Platelets: 233 10*3/uL (ref 150–400)
RBC: 3.48 MIL/uL — ABNORMAL LOW (ref 3.87–5.11)
RDW: 13.8 % (ref 11.5–15.5)
WBC: 5.9 10*3/uL (ref 4.0–10.5)

## 2014-12-22 NOTE — Progress Notes (Signed)
Quick Note:  Very mild drift in Hgb. Let's check iron, ferritin, TIBC. ______

## 2014-12-23 ENCOUNTER — Other Ambulatory Visit: Payer: Self-pay

## 2014-12-23 ENCOUNTER — Other Ambulatory Visit: Payer: Self-pay | Admitting: Gastroenterology

## 2014-12-23 DIAGNOSIS — D649 Anemia, unspecified: Secondary | ICD-10-CM

## 2014-12-27 ENCOUNTER — Encounter: Payer: Self-pay | Admitting: Internal Medicine

## 2014-12-28 ENCOUNTER — Encounter: Payer: Self-pay | Admitting: Internal Medicine

## 2015-01-28 LAB — IRON AND TIBC
%SAT: 26 % (ref 11–50)
Iron: 107 ug/dL (ref 45–160)
TIBC: 412 ug/dL (ref 250–450)
UIBC: 305 ug/dL (ref 125–400)

## 2015-01-28 LAB — FERRITIN: FERRITIN: 80 ng/mL (ref 10–291)

## 2015-01-29 NOTE — Progress Notes (Signed)
Quick Note:  Iron levels look good. Likely anemia of chronic disease. As she is on Imuran, still need to keep a close eye on this. Check CBC in 3 months. ______

## 2015-01-30 ENCOUNTER — Other Ambulatory Visit: Payer: Self-pay | Admitting: Gastroenterology

## 2015-01-30 DIAGNOSIS — D649 Anemia, unspecified: Secondary | ICD-10-CM

## 2015-03-27 ENCOUNTER — Other Ambulatory Visit: Payer: Self-pay

## 2015-03-27 DIAGNOSIS — D649 Anemia, unspecified: Secondary | ICD-10-CM

## 2015-05-05 LAB — CBC WITH DIFFERENTIAL/PLATELET
BASOS ABS: 0 10*3/uL (ref 0.0–0.1)
BASOS PCT: 0 % (ref 0–1)
EOS ABS: 0.2 10*3/uL (ref 0.0–0.7)
Eosinophils Relative: 2 % (ref 0–5)
HCT: 34.5 % — ABNORMAL LOW (ref 36.0–46.0)
Hemoglobin: 11.7 g/dL — ABNORMAL LOW (ref 12.0–15.0)
Lymphocytes Relative: 26 % (ref 12–46)
Lymphs Abs: 2 10*3/uL (ref 0.7–4.0)
MCH: 33.4 pg (ref 26.0–34.0)
MCHC: 33.9 g/dL (ref 30.0–36.0)
MCV: 98.6 fL (ref 78.0–100.0)
MONOS PCT: 7 % (ref 3–12)
MPV: 9.5 fL (ref 8.6–12.4)
Monocytes Absolute: 0.5 10*3/uL (ref 0.1–1.0)
NEUTROS ABS: 4.9 10*3/uL (ref 1.7–7.7)
Neutrophils Relative %: 65 % (ref 43–77)
PLATELETS: 264 10*3/uL (ref 150–400)
RBC: 3.5 MIL/uL — AB (ref 3.87–5.11)
RDW: 14.3 % (ref 11.5–15.5)
WBC: 7.5 10*3/uL (ref 4.0–10.5)

## 2015-05-09 NOTE — Progress Notes (Signed)
Quick Note:  Hgb remaining around baseline. She is due for an office visit. Please arrange with me, if she is ok with that. ______

## 2015-05-10 ENCOUNTER — Encounter: Payer: Self-pay | Admitting: Internal Medicine

## 2015-06-02 ENCOUNTER — Ambulatory Visit: Payer: Medicare Other | Admitting: Gastroenterology

## 2015-06-26 ENCOUNTER — Ambulatory Visit (INDEPENDENT_AMBULATORY_CARE_PROVIDER_SITE_OTHER): Payer: Medicare Other | Admitting: Gastroenterology

## 2015-06-26 ENCOUNTER — Encounter: Payer: Self-pay | Admitting: Gastroenterology

## 2015-06-26 VITALS — BP 115/68 | HR 89 | Temp 98.3°F | Ht 69.0 in | Wt 216.6 lb

## 2015-06-26 DIAGNOSIS — K5080 Crohn's disease of both small and large intestine without complications: Secondary | ICD-10-CM

## 2015-06-26 MED ORDER — MESALAMINE ER 500 MG PO CPCR: ORAL_CAPSULE | ORAL | Status: DC

## 2015-06-26 MED ORDER — AZATHIOPRINE 50 MG PO TABS: ORAL_TABLET | ORAL | Status: DC

## 2015-06-26 NOTE — Patient Instructions (Signed)
We will see you in 6 months!   Look forward to seeing you again! Let me know if you need anything.

## 2015-06-26 NOTE — Progress Notes (Signed)
Referring Provider: Monico Blitz, MD Primary Care Physician:  Monico Blitz, MD  Primary GI: Dr. Gala Romney   Chief Complaint  Patient presents with  . Follow-up    HPI:   Sheri [name] is a 65 y.o. 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 Interesting symptoms of perirectal discomfort in past, with CTE and subsequent MRI raising question of possible fistula in the past but no current inflammatory issues or active fistula/abscess. Due to recent rectal bleeding in May 2016, underwent updated colonoscopy. Noting friable eroded mucosa at the anastomosis with small bowel, consistent with some stenosis Crohn's disease s/p biopsy. Colonic diverticulosis and external hemorrhoids. Pathology with benign anastomotic mucosa, no dysplasia. Discussion of Humira briefly raised during last appt prior to colonoscopy, but due to benign pathology, decision was made to continue Imuran and Pentasa. She had some mild normocytic anemia, with Hgb staying around baseline of upper 11 range. Iron/ferritin normal.   On narcotics for back. Baseline BMs. No rectal bleeding or pain. No abdominal pain. Good appetite. Some fibromyalgia issues. Dealing with short-term memory issues. Will be seeing a specialist soon.   Past Medical History  Diagnosis Date  . Crohn disease ([place])   . Anemia   . Low back pain   . Fibromyalgia   . Osteopenia     Last DEXA 12/2010 was normal, on fosamax  . Complication of anesthesia   . PONV (postoperative nausea and vomiting)   . GERD (gastroesophageal reflux disease)   . Hypertension   . Chronic back pain   . Chronic neck pain     Past Surgical History  Procedure Laterality Date  . Small intestine surgery  [id number]  . Cesarean section  [id number]  . Appendectomy  1990  . Drainage of back abscess  2007  . Colonoscopy  09/11/2006  . Esophagogastroduodenoscopy  04/2009    noncritical schatzki's ring, small hh, sb bx negative  .  Colonoscopy  10/30/2010    RMR: External and internal hemorrhoids, likely source of hematochezia  otherwise normal rectum/ Left-sided diverticula, status post prior right hemicolectomy with a friable, inflamed, stenotic surgical anastomosis with upstream dilation of the neoterminal ileum/ Crohn's noted, status post biopsy  . Cataract extraction w/phaco Left 01/13/2014    Procedure: CATARACT EXTRACTION PHACO AND INTRAOCULAR LENS PLACEMENT LEFT EYE;  Surgeon: Tonny Branch, MD;  Location: [facility];  Service: Ophthalmology;  Laterality: Left;  CDE:6.90  . Cataract extraction w/phaco Right 01/24/2014    Procedure: CATARACT EXTRACTION PHACO AND INTRAOCULAR LENS PLACEMENT (IOC);  Surgeon: Tonny Branch, MD;  Location: [facility];  Service: Ophthalmology;  Laterality: Right;  CDE:5.91  . Colonoscopy N/A 06/23/2014    Dr. Rourk:friable eroded mucosa at the anastomosis with small bowel, consistent with some stenosis Crohn's disease s/p biopsy. Colonic diverticulosis and external hemorrhoids. Pathology with benign anastomotic mucosa, no dysplasia.     Current Outpatient Prescriptions  Medication Sig Dispense Refill  . ALPRAZolam (XANAX) 0.5 MG tablet     . azaTHIOprine (IMURAN) 50 MG tablet TAKE 2 & 1/2 TABLETS DAILY. 75 tablet 11  . baclofen (LIORESAL) 10 MG tablet Take 10 mg by mouth 3 (three) times daily.    . Calcium Carbonate-Vitamin D (CALCIUM 600+D PO) Take by mouth 2 (two) times daily.      . Cyanocobalamin (VITAMIN B-12 IJ) Inject as directed every 30 (thirty) days.      . cyclobenzaprine (FLEXERIL) 5 MG tablet Take 5 mg by mouth 3 (three) times  daily.    . fish oil-omega-3 fatty acids 1000 MG capsule Take 2 g by mouth daily.      . hyoscyamine (LEVSIN SL) 0.125 MG SL tablet Place 1 tablet (0.125 mg total) under the tongue 4 (four) times daily -  before meals and at bedtime. 60 tablet 3  . lisinopril-hydrochlorothiazide (PRINZIDE,ZESTORETIC) 20-12.5 MG per tablet Take 1 tablet by mouth daily. 30 tablet 0  .  Loratadine 10 MG CAPS Take 10 mg by mouth daily.     . mesalamine (PENTASA) 500 MG CR capsule TAKE (2) CAPSULES FOUR TIMES DAILY. 240 capsule 11  . multivitamin (THERAGRAN) per tablet Take 1 tablet by mouth daily.      Marland Kitchen omeprazole (PRILOSEC) 20 MG capsule Take 20 mg by mouth daily.      . ondansetron (ZOFRAN) 4 MG tablet Take 4 mg by mouth. ONE BY MOUTH EVERY 4-6 HOURS AS NEEDED FOR NAUSEA AND VOMITING     . oxyCODONE (ROXICODONE) 15 MG immediate release tablet Take 15 mg by mouth every 4 (four) hours as needed.     . vitamin E 1000 UNIT capsule Take 1,000 Units by mouth daily.      Marland Kitchen zinc gluconate 50 MG tablet Take 50 mg by mouth daily.      . citalopram (CELEXA) 20 MG tablet Take 1 tablet (20 mg total) by mouth 2 (two) times daily. (Patient not taking: Reported on 06/26/2015) 60 tablet 0  . Probiotic Product (RESTORA) CAPS Take 1 capsule by mouth daily. (Patient not taking: Reported on 06/26/2015) 28 capsule 0  . promethazine (PHENERGAN) 12.5 MG tablet 1-2 po 6 h prn nausea or vomiting (Patient not taking: Reported on 06/26/2015) 40 tablet 0   No current facility-administered medications for this visit.    Allergies as of 06/26/2015 - Review Complete 06/26/2015  Allergen Reaction Noted  . Codeine sulfate      Family History  Problem Relation Age of Onset  . Crohn's disease Mother     succumbed to stomach cancer in her 6s  . Colon cancer Neg Hx     Social History   Social History  . Marital Status: Legally Separated    Spouse Name: N/A  . Number of Children: 2  . Years of Education: N/A   Occupational History  . disability    Social History Main Topics  . Smoking status: Never Smoker   . Smokeless tobacco: None  . Alcohol Use: Yes     Comment: frozen drinks here and there  . Drug Use: No  . Sexual Activity: Yes    Birth Control/ Protection: None   Other Topics Concern  . None   Social History Narrative    Review of Systems: As mentioned in HPI.   Physical  Exam: BP 115/68 mmHg  Pulse 89  Temp(Src) 98.3 F (36.8 C) (Oral)  Ht 5' 9"  (1.753 m)  Wt 216 lb 9.6 oz (98.249 kg)  BMI 31.97 kg/m2 General:   Alert and oriented. No distress noted. Pleasant and cooperative.  Head:  Normocephalic and atraumatic. Abdomen:  +BS, soft, non-tender and non-distended. No rebound or guarding. No HSM or masses noted. Msk:  Symmetrical without gross deformities. Normal posture. Extremities:  Without edema. Neurologic:  Alert and  oriented x4;  grossly normal neurologically. Psych:  Alert and cooperative. Normal mood and affect  Lab Results  Component Value Date   WBC 7.5 05/05/2015   HGB 11.7* 05/05/2015   HCT 34.5* 05/05/2015   MCV 98.6  05/05/2015   PLT 264 05/05/2015

## 2015-06-30 NOTE — Assessment & Plan Note (Signed)
Doing well on Imuran and Pentasa. No concerns. Return in 6 months.

## 2015-07-03 NOTE — Progress Notes (Signed)
CC'D TO PCP °

## 2015-07-14 ENCOUNTER — Other Ambulatory Visit: Payer: Self-pay | Admitting: Gastroenterology

## 2015-12-27 ENCOUNTER — Encounter: Payer: Self-pay | Admitting: Gastroenterology

## 2015-12-27 ENCOUNTER — Ambulatory Visit (INDEPENDENT_AMBULATORY_CARE_PROVIDER_SITE_OTHER): Payer: Medicare Other | Admitting: Gastroenterology

## 2015-12-27 VITALS — BP 147/72 | HR 99 | Temp 98.2°F | Wt 198.6 lb

## 2015-12-27 DIAGNOSIS — K5080 Crohn's disease of both small and large intestine without complications: Secondary | ICD-10-CM | POA: Diagnosis not present

## 2015-12-27 NOTE — Progress Notes (Signed)
Referring Provider: Monico Blitz, MD Primary Care Physician:  Monico Blitz, MD Primary GI: Dr. Gala Romney   Chief Complaint  Patient presents with  . Follow-up    HPI:   Sheri [name] is a 65 y.o. female presenting today with a history of  ileocolonic Crohn's disease on Imuran and Pentasa. Interesting symptoms of perirectal discomfort in past, with CTE and subsequent MRI raising question of possible fistula in the past but no current inflammatory issues or active fistula/abscess. Due to recent rectal bleeding in May 2016, underwent updated colonoscopy. Noting friable eroded mucosa at the anastomosis with small bowel, consistent with some stenosis Crohn's disease s/p biopsy. Colonic diverticulosis and external hemorrhoids. Pathology with benign anastomotic mucosa, no dysplasia. Discussion of Humira briefly raised during last appt prior to colonoscopy, but due to benign pathology, decision was made to continue Imuran and Pentasa. She had some mild normocytic anemia, with Hgb staying around baseline of upper 11 range. Iron/ferritin normal. Need most updated labs, which she states were drawn about a month ago.   Denies abdominal pain, N/V, rectal bleeding. Doing well from a GI perspective.   Past Medical History:  Diagnosis Date  . Anemia   . Chronic back pain   . Chronic neck pain   . Complication of anesthesia   . Crohn disease ([place])   . Fibromyalgia   . GERD (gastroesophageal reflux disease)   . Hypertension   . Low back pain   . Osteopenia    Last DEXA 12/2010 was normal, on fosamax  . PONV (postoperative nausea and vomiting)     Past Surgical History:  Procedure Laterality Date  . APPENDECTOMY  1990  . CATARACT EXTRACTION W/PHACO Left 01/13/2014   Procedure: CATARACT EXTRACTION PHACO AND INTRAOCULAR LENS PLACEMENT LEFT EYE;  Surgeon: Tonny Branch, MD;  Location: [facility];  Service: Ophthalmology;  Laterality: Left;  CDE:6.90  . CATARACT EXTRACTION W/PHACO Right 01/24/2014   Procedure: CATARACT EXTRACTION PHACO AND INTRAOCULAR LENS PLACEMENT (IOC);  Surgeon: Tonny Branch, MD;  Location: [facility];  Service: Ophthalmology;  Laterality: Right;  CDE:5.91  . CESAREAN SECTION  [id number]  . COLONOSCOPY  09/11/2006  . COLONOSCOPY  10/30/2010   RMR: External and internal hemorrhoids, likely source of hematochezia  otherwise normal rectum/ Left-sided diverticula, status post prior right hemicolectomy with a friable, inflamed, stenotic surgical anastomosis with upstream dilation of the neoterminal ileum/ Crohn's noted, status post biopsy  . COLONOSCOPY N/A 06/23/2014   Dr. Rourk:friable eroded mucosa at the anastomosis with small bowel, consistent with some stenosis Crohn's disease s/p biopsy. Colonic diverticulosis and external hemorrhoids. Pathology with benign anastomotic mucosa, no dysplasia.   Marland Kitchen drainage of back abscess  2007  . ESOPHAGOGASTRODUODENOSCOPY  04/2009   noncritical schatzki's ring, small hh, sb bx negative  . SMALL INTESTINE SURGERY  [id number]    Current Outpatient Prescriptions  Medication Sig Dispense Refill  . ALPRAZolam (XANAX) 0.5 MG tablet     . azaTHIOprine (IMURAN) 50 MG tablet TAKE 2 & 1/2 TABLETS DAILY. 75 tablet 11  . azaTHIOprine (IMURAN) 50 MG tablet TAKE 2 & 1/2 TABLETS DAILY. 75 tablet 11  . baclofen (LIORESAL) 10 MG tablet Take 10 mg by mouth 3 (three) times daily.    . Calcium Carbonate-Vitamin D (CALCIUM 600+D PO) Take by mouth 2 (two) times daily.      . Cyanocobalamin (VITAMIN B-12 IJ) Inject as directed every 30 (thirty) days.      . cyclobenzaprine (FLEXERIL) 5 MG tablet Take 5 mg by  mouth 3 (three) times daily.    . fish oil-omega-3 fatty acids 1000 MG capsule Take 2 g by mouth daily.      . hyoscyamine (LEVSIN SL) 0.125 MG SL tablet Place 1 tablet (0.125 mg total) under the tongue 4 (four) times daily -  before meals and at bedtime. 60 tablet 3  . lisinopril-hydrochlorothiazide (PRINZIDE,ZESTORETIC) 20-12.5 MG per tablet Take 1 tablet by  mouth daily. 30 tablet 0  . Loratadine 10 MG CAPS Take 10 mg by mouth daily.     . mesalamine (PENTASA) 500 MG CR capsule TAKE (2) CAPSULES FOUR TIMES DAILY. 240 capsule 11  . multivitamin (THERAGRAN) per tablet Take 1 tablet by mouth daily.      Marland Kitchen omeprazole (PRILOSEC) 20 MG capsule Take 20 mg by mouth daily.      . ondansetron (ZOFRAN) 4 MG tablet Take 4 mg by mouth. ONE BY MOUTH EVERY 4-6 HOURS AS NEEDED FOR NAUSEA AND VOMITING     . oxyCODONE (ROXICODONE) 15 MG immediate release tablet Take 15 mg by mouth every 4 (four) hours as needed.     . vitamin E 1000 UNIT capsule Take 1,000 Units by mouth daily.      Marland Kitchen zinc gluconate 50 MG tablet Take 50 mg by mouth daily.       No current facility-administered medications for this visit.     Allergies as of 12/27/2015 - Review Complete 12/27/2015  Allergen Reaction Noted  . Codeine sulfate      Family History  Problem Relation Age of Onset  . Crohn's disease Mother     succumbed to stomach cancer in her 30s  . Colon cancer Neg Hx     Social History   Social History  . Marital status: Legally Separated    Spouse name: N/A  . Number of children: 2  . Years of education: N/A   Occupational History  . disability Not Employed   Social History Main Topics  . Smoking status: Never Smoker  . Smokeless tobacco: None  . Alcohol use Yes     Comment: frozen drinks here and there  . Drug use: No  . Sexual activity: Yes    Birth control/ protection: None   Other Topics Concern  . None   Social History Narrative  . None    Review of Systems: As mentioned in HPI   Physical Exam: BP (!) 147/72   Pulse 99   Temp 98.2 F (36.8 C) (Oral)   Wt 198 lb 9.6 oz (90.1 kg)   BMI 29.33 kg/m  General:   Alert and oriented. No distress noted. Pleasant and cooperative.  Head:  Normocephalic and atraumatic. Eyes:  Conjuctiva clear without scleral icterus. Mouth:  Oral mucosa pink and moist. Good dentition. No lesions. Abdomen:  +BS,  soft, non-tender and non-distended. No rebound or guarding. No HSM or masses noted. Msk:  Symmetrical without gross deformities. Normal posture. Extremities:  Without edema. Neurologic:  Alert and  oriented x4;  grossly normal neurologicall Psych:  Alert and cooperative. Normal mood and affect.

## 2015-12-27 NOTE — Patient Instructions (Signed)
We will see you back in 6 months.  I am so glad you are doing well!

## 2015-12-30 ENCOUNTER — Telehealth: Payer: Self-pay | Admitting: Gastroenterology

## 2015-12-30 DIAGNOSIS — K50119 Crohn's disease of large intestine with unspecified complications: Secondary | ICD-10-CM

## 2015-12-30 NOTE — Telephone Encounter (Signed)
Sheri [name]: can we get most recent labs from PCP?   [facility] clinical pool: I didn't realize she was due for a DEXA scan when I saw her in clinic. Last I have is from March 2015. Can we go ahead and contact her to let her know we need to get this set up?

## 2015-12-30 NOTE — Assessment & Plan Note (Signed)
Continues on Imuran and Pentasa. Doing well. Needs updated labs retrieved from PCP office. Also due for DEXA scan, as last documented was in 2015. Will arrange and have her return in 6 months.

## 2016-01-01 ENCOUNTER — Ambulatory Visit (HOSPITAL_COMMUNITY): Payer: Medicare Other

## 2016-01-01 ENCOUNTER — Other Ambulatory Visit: Payer: Self-pay

## 2016-01-01 DIAGNOSIS — M8580 Other specified disorders of bone density and structure, unspecified site: Secondary | ICD-10-CM

## 2016-01-01 DIAGNOSIS — M797 Fibromyalgia: Secondary | ICD-10-CM

## 2016-01-01 DIAGNOSIS — K50919 Crohn's disease, unspecified, with unspecified complications: Secondary | ICD-10-CM

## 2016-01-01 DIAGNOSIS — Z780 Asymptomatic menopausal state: Secondary | ICD-10-CM

## 2016-01-01 NOTE — Telephone Encounter (Signed)
Pt said that she is set up for one with her PCP on Dec 5th.

## 2016-01-01 NOTE — Telephone Encounter (Signed)
Great!   Can we get copies of this when it comes.

## 2016-01-01 NOTE — Progress Notes (Signed)
CC'D TO PCP °

## 2016-01-02 NOTE — Telephone Encounter (Signed)
Requested recent labs from PCP

## 2016-01-10 ENCOUNTER — Other Ambulatory Visit (HOSPITAL_COMMUNITY): Payer: Medicare Other

## 2016-02-27 NOTE — Telephone Encounter (Signed)
Reviewed outside labs dated Oct 2017. BUN 11, Cr 0.7. Alk Phos 96, AST 26, ALT 23, Tbili 2.4, which is elevated. 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   Need repeat HFP when patient is able.

## 2016-03-05 ENCOUNTER — Other Ambulatory Visit: Payer: Self-pay

## 2016-03-05 ENCOUNTER — Encounter: Payer: Self-pay | Admitting: Gastroenterology

## 2016-03-05 DIAGNOSIS — K50119 Crohn's disease of large intestine with unspecified complications: Secondary | ICD-10-CM

## 2016-03-05 NOTE — Telephone Encounter (Signed)
Letter and lab orders mailed to the pt.

## 2016-03-07 LAB — HEPATIC FUNCTION PANEL
ALBUMIN: 3.8 g/dL (ref 3.6–5.1)
ALT: 14 U/L (ref 6–29)
AST: 17 U/L (ref 10–35)
Alkaline Phosphatase: 82 U/L (ref 33–130)
Bilirubin, Direct: 0.8 mg/dL — ABNORMAL HIGH (ref <=0.2)
Indirect Bilirubin: 1.4 mg/dL — ABNORMAL HIGH (ref 0.2–1.2)
Total Bilirubin: 2.2 mg/dL — ABNORMAL HIGH (ref 0.2–1.2)
Total Protein: 6.5 g/dL (ref 6.1–8.1)

## 2016-03-13 NOTE — Progress Notes (Signed)
Total bilirubin, conjugated, and unconjugated bilirubin is elevated slightly. With this pattern, and it appears this has been going on since at least 2013 if not before, she has Gilbert's. However, hasn't had an updated US abdomen yet. We need to have RUQ US abdomen to evaluate liver. I suspect all will be fine and unimpressive.

## 2016-03-14 ENCOUNTER — Other Ambulatory Visit: Payer: Self-pay

## 2016-03-14 DIAGNOSIS — K50119 Crohn's disease of large intestine with unspecified complications: Secondary | ICD-10-CM

## 2016-03-22 ENCOUNTER — Ambulatory Visit (HOSPITAL_COMMUNITY): Admission: RE | Admit: 2016-03-22 | Payer: Medicare Other | Source: Ambulatory Visit

## 2016-04-01 ENCOUNTER — Ambulatory Visit (HOSPITAL_COMMUNITY)
Admission: RE | Admit: 2016-04-01 | Discharge: 2016-04-01 | Disposition: A | Discharge status: Home / Self Care | Payer: Medicare Other | Source: Ambulatory Visit | Attending: Gastroenterology | Admitting: Gastroenterology

## 2016-04-01 DIAGNOSIS — R932 Abnormal findings on diagnostic imaging of liver and biliary tract: Secondary | ICD-10-CM | POA: Diagnosis not present

## 2016-04-01 DIAGNOSIS — K8020 Calculus of gallbladder without cholecystitis without obstruction: Secondary | ICD-10-CM | POA: Insufficient documentation

## 2016-04-01 DIAGNOSIS — K50119 Crohn's disease of large intestine with unspecified complications: Secondary | ICD-10-CM | POA: Diagnosis not present

## 2016-04-02 DIAGNOSIS — F329 Major depressive disorder, single episode, unspecified: Secondary | ICD-10-CM | POA: Insufficient documentation

## 2016-04-03 NOTE — Progress Notes (Signed)
US abdomen reviewed. Likely fatty liver but there is a question of slightly nodular appearance. I'm not convinced she has advanced cirrhosis. Likely has fatty liver. Platelets are normal, labs look consistent with Gilbert's. To further investigate, an elastography could be performed to get a baseline fibrosis score. Again, I am not thinking she has cirrhosis but this imaging raises a question that we need to further investigate.

## 2016-04-16 ENCOUNTER — Other Ambulatory Visit: Payer: Self-pay

## 2016-04-16 DIAGNOSIS — K50019 Crohn's disease of small intestine with unspecified complications: Secondary | ICD-10-CM

## 2016-04-16 DIAGNOSIS — R932 Abnormal findings on diagnostic imaging of liver and biliary tract: Secondary | ICD-10-CM

## 2016-04-19 ENCOUNTER — Ambulatory Visit (HOSPITAL_COMMUNITY)
Admission: RE | Admit: 2016-04-19 | Discharge: 2016-04-19 | Disposition: A | Discharge status: Home / Self Care | Payer: Medicare Other | Source: Ambulatory Visit | Attending: Gastroenterology | Admitting: Gastroenterology

## 2016-04-19 DIAGNOSIS — K50019 Crohn's disease of small intestine with unspecified complications: Secondary | ICD-10-CM | POA: Insufficient documentation

## 2016-04-19 DIAGNOSIS — R932 Abnormal findings on diagnostic imaging of liver and biliary tract: Secondary | ICD-10-CM

## 2016-04-19 DIAGNOSIS — K8020 Calculus of gallbladder without cholecystitis without obstruction: Secondary | ICD-10-CM | POA: Insufficient documentation

## 2016-04-22 NOTE — Progress Notes (Signed)
Metavir score low at F0./F1. There are no signs to point towards advanced liver disease. Dealing with fatty liver as expected.

## 2016-04-23 ENCOUNTER — Telehealth (HOSPITAL_COMMUNITY): Payer: Self-pay | Admitting: General Practice

## 2016-04-23 NOTE — Telephone Encounter (Signed)
I received a call from [facility] with Dr. Lynnell Jude) office and they would like to have the patient's latest lab work and u/s reports faxed to their office.  Routing to [employer]

## 2016-04-24 NOTE — Telephone Encounter (Signed)
cc'ed to PCP

## 2016-04-29 ENCOUNTER — Encounter: Payer: Self-pay | Admitting: Neurology

## 2016-06-21 ENCOUNTER — Encounter: Payer: Self-pay | Admitting: Neurology

## 2016-06-21 ENCOUNTER — Ambulatory Visit (INDEPENDENT_AMBULATORY_CARE_PROVIDER_SITE_OTHER): Payer: Medicare Other | Admitting: Neurology

## 2016-06-21 VITALS — HR 83 | Ht 70.0 in | Wt 195.0 lb

## 2016-06-21 DIAGNOSIS — G3184 Mild cognitive impairment, so stated: Secondary | ICD-10-CM

## 2016-06-21 DIAGNOSIS — R292 Abnormal reflex: Secondary | ICD-10-CM

## 2016-06-21 NOTE — Patient Instructions (Addendum)
1. Schedule MRI brain with and without contrast 2. Schedule MRI cervical spine without contrast 3. Schedule Neurocognitive testing with Dr. Otelia Limes have been referred for a neurocognitive evaluation in our office.   The evaluation takes approximately two hours. The first part of the appointment is a clinical interview with the neuropsychologist (Dr. Macarthur Critchley). Please bring someone with you to this appointment if possible, as it is helpful for Dr. Si Raider to hear from both you and another adult who knows you well. After speaking with Dr. Si Raider, you will complete testing with her technician. The testing includes a variety of tasks- mostly question-and-answer, some paper-and-pencil. There is nothing you need to do to prepare for this appointment, but having a good night's sleep prior to the testing, and bringing eyeglasses and hearing aids (if you wear them), is advised.   About a week after the evaluation, you will return to follow up with Dr. Si Raider to review the test results. This appointment is about 30 minutes. If you would like a family member to receive this information as well, please bring them to the appointment.   We have to reserve several hours of the neuropsychologist's time and the psychometrician's time for your evaluation appointment. As such, please note that there is a No-Show fee of $100. If you are unable to attend any of your appointments, please contact our office as soon as possible to reschedule.
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
## 2016-06-25 ENCOUNTER — Ambulatory Visit (INDEPENDENT_AMBULATORY_CARE_PROVIDER_SITE_OTHER): Payer: Medicare Other | Admitting: Gastroenterology

## 2016-06-25 ENCOUNTER — Encounter: Payer: Self-pay | Admitting: Gastroenterology

## 2016-06-25 VITALS — BP 121/73 | HR 91 | Temp 97.8°F | Ht 70.0 in | Wt 197.6 lb

## 2016-06-25 DIAGNOSIS — K5080 Crohn's disease of both small and large intestine without complications: Secondary | ICD-10-CM | POA: Diagnosis not present

## 2016-06-25 NOTE — Patient Instructions (Signed)
Please complete the blood work.   I will get the bone scan results.   I will see you in 6 months!

## 2016-06-25 NOTE — Progress Notes (Signed)
Referring Provider: Monico Blitz, MD Primary Care Physician:  Monico Blitz, MD Primary GI: Dr. Gala Romney   Chief Complaint  Patient presents with  . Crohn's Disease    f/u, doing ok    HPI:   Sheri [name] is a 66 y.o. 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 Interesting symptoms of perirectal discomfort in past, with CTE and subsequent MRI raising question of possible fistula in the past but no current inflammatory issues or active fistula/abscess. Due to recent rectal bleeding in May 2016, underwent updated colonoscopy. Noted friable eroded mucosa at the anastomosis with small bowel, consistent with some stenosis Crohn's disease s/p biopsy. Colonic diverticulosis and external hemorrhoids. Pathology with benign anastomotic mucosa, no dysplasia. Discussion of Humira briefly raised during last appt prior to colonoscopy, but due to benign pathology, decision was made to continue Imuran and Pentasa. Due for DEXA scan.    She states a DEXA scan was done recently. I will request this. No abdominal pain, N/V. Doing quite well with her medication regimen. Bowel habits at baseline. No rectal bleeding. Has no GI complaints today. Due for routine blood work now.    Past Medical History:  Diagnosis Date  . Anemia   . Chronic back pain   . Chronic neck pain   . Complication of anesthesia   . Crohn disease ([place])   . Fibromyalgia   . GERD (gastroesophageal reflux disease)   . Hypertension   . Low back pain   . Osteopenia    Last DEXA 12/2010 was normal, on fosamax  . PONV (postoperative nausea and vomiting)     Past Surgical History:  Procedure Laterality Date  . APPENDECTOMY  1990  . CATARACT EXTRACTION W/PHACO Left 01/13/2014   Procedure: CATARACT EXTRACTION PHACO AND INTRAOCULAR LENS PLACEMENT LEFT EYE;  Surgeon: Tonny Branch, MD;  Location: [facility];  Service: Ophthalmology;  Laterality: Left;  CDE:6.90  . CATARACT EXTRACTION W/PHACO Right  01/24/2014   Procedure: CATARACT EXTRACTION PHACO AND INTRAOCULAR LENS PLACEMENT (IOC);  Surgeon: Tonny Branch, MD;  Location: [facility];  Service: Ophthalmology;  Laterality: Right;  CDE:5.91  . CESAREAN SECTION  [id number]  . COLONOSCOPY  09/11/2006  . COLONOSCOPY  10/30/2010   RMR: External and internal hemorrhoids, likely source of hematochezia  otherwise normal rectum/ Left-sided diverticula, status post prior right hemicolectomy with a friable, inflamed, stenotic surgical anastomosis with upstream dilation of the neoterminal ileum/ Crohn's noted, status post biopsy  . COLONOSCOPY N/A 06/23/2014   Dr. Rourk:friable eroded mucosa at the anastomosis with small bowel, consistent with some stenosis Crohn's disease s/p biopsy. Colonic diverticulosis and external hemorrhoids. Pathology with benign anastomotic mucosa, no dysplasia.   Marland Kitchen drainage of back abscess  2007  . ESOPHAGOGASTRODUODENOSCOPY  04/2009   noncritical schatzki's ring, small hh, sb bx negative  . SMALL INTESTINE SURGERY  [id number]    Current Outpatient Prescriptions  Medication Sig Dispense Refill  . azaTHIOprine (IMURAN) 50 MG tablet TAKE 2 & 1/2 TABLETS DAILY. 75 tablet 11  . azaTHIOprine (IMURAN) 50 MG tablet TAKE 2 & 1/2 TABLETS DAILY. 75 tablet 11  . Calcium Carbonate-Vitamin D (CALCIUM 600+D PO) Take by mouth 2 (two) times daily.      . Cyanocobalamin (VITAMIN B-12 IJ) Inject as directed every 14 (fourteen) days.     . cyclobenzaprine (FLEXERIL) 5 MG tablet Take 5 mg by mouth 3 (three) times daily.    . fish oil-omega-3 fatty acids 1000 MG capsule  Take 2 g by mouth daily.      Marland Kitchen lisinopril-hydrochlorothiazide (PRINZIDE,ZESTORETIC) 20-12.5 MG per tablet Take 1 tablet by mouth daily. 30 tablet 0  . Loratadine 10 MG CAPS Take 10 mg by mouth daily.     . mesalamine (PENTASA) 500 MG CR capsule TAKE (2) CAPSULES FOUR TIMES DAILY. 240 capsule 11  . multivitamin (THERAGRAN) per tablet Take 1 tablet by mouth daily.      Marland Kitchen omeprazole  (PRILOSEC) 20 MG capsule Take 20 mg by mouth daily.      . ondansetron (ZOFRAN) 4 MG tablet Take 4 mg by mouth. ONE BY MOUTH EVERY 4-6 HOURS AS NEEDED FOR NAUSEA AND VOMITING     . oxyCODONE (ROXICODONE) 15 MG immediate release tablet Take 15 mg by mouth every 4 (four) hours as needed.     . vitamin E 1000 UNIT capsule Take 1,000 Units by mouth daily.      Marland Kitchen zinc gluconate 50 MG tablet Take 50 mg by mouth daily.       No current facility-administered medications for this visit.     Allergies as of 06/25/2016 - Review Complete 06/25/2016  Allergen Reaction Noted  . Codeine sulfate      Family History  Problem Relation Age of Onset  . Crohn's disease Mother        succumbed to stomach cancer in her 59s  . Colon cancer Neg Hx     Social History   Social History  . Marital status: Legally Separated    Spouse name: N/A  . Number of children: 2  . Years of education: N/A   Occupational History  . disability Not Employed   Social History Main Topics  . Smoking status: Never Smoker  . Smokeless tobacco: Never Used  . Alcohol use Yes     Comment: frozen drinks here and there  . Drug use: No  . Sexual activity: Yes    Birth control/ protection: None   Other Topics Concern  . None   Social History Narrative  . None    Review of Systems: Negative unless mentioned in HPI   Physical Exam: BP 121/73   Pulse 91   Temp 97.8 F (36.6 C) (Oral)   Ht 5' 10"  (1.778 m)   Wt 197 lb 9.6 oz (89.6 kg)   BMI 28.35 kg/m  General:   Alert and oriented. No distress noted. Pleasant and cooperative.  Head:  Normocephalic and atraumatic. Eyes:  Conjuctiva clear without scleral icterus. Mouth:  Oral mucosa pink and moist. Good dentition. No lesions. Abdomen:  +BS, soft, non-tender and non-distended. No rebound or guarding. No HSM or masses noted. Msk:  Symmetrical without gross deformities. Normal posture. Extremities:  Without edema. Neurologic:  Alert and  oriented x4;  grossly  normal neurologically. Psych:  Alert and cooperative. Normal mood and affect.

## 2016-06-26 LAB — COMPLETE METABOLIC PANEL WITH GFR
ALBUMIN: 3.8 g/dL (ref 3.6–5.1)
ALK PHOS: 99 U/L (ref 33–130)
ALT: 13 U/L (ref 6–29)
AST: 15 U/L (ref 10–35)
BUN: 17 mg/dL (ref 7–25)
CALCIUM: 9.7 mg/dL (ref 8.6–10.4)
CO2: 27 mmol/L (ref 20–31)
Chloride: 103 mmol/L (ref 98–110)
Creat: 0.68 mg/dL (ref 0.50–0.99)
Glucose, Bld: 91 mg/dL (ref 65–99)
POTASSIUM: 3.8 mmol/L (ref 3.5–5.3)
Sodium: 139 mmol/L (ref 135–146)
TOTAL PROTEIN: 6.8 g/dL (ref 6.1–8.1)
Total Bilirubin: 1.3 mg/dL — ABNORMAL HIGH (ref 0.2–1.2)

## 2016-06-26 LAB — CBC WITH DIFFERENTIAL/PLATELET
BASOS ABS: 0 {cells}/uL (ref 0–200)
BASOS PCT: 0 %
EOS ABS: 75 {cells}/uL (ref 15–500)
Eosinophils Relative: 1 %
HEMATOCRIT: 35.2 % (ref 35.0–45.0)
Hemoglobin: 11.6 g/dL — ABNORMAL LOW (ref 11.7–15.5)
Lymphocytes Relative: 31 %
Lymphs Abs: 2325 cells/uL (ref 850–3900)
MCH: 33.2 pg — AB (ref 27.0–33.0)
MCHC: 33 g/dL (ref 32.0–36.0)
MCV: 100.9 fL — AB (ref 80.0–100.0)
MONO ABS: 450 {cells}/uL (ref 200–950)
MPV: 9.4 fL (ref 7.5–12.5)
Monocytes Relative: 6 %
NEUTROS ABS: 4650 {cells}/uL (ref 1500–7800)
Neutrophils Relative %: 62 %
Platelets: 223 10*3/uL (ref 140–400)
RBC: 3.49 MIL/uL — ABNORMAL LOW (ref 3.80–5.10)
RDW: 14.5 % (ref 11.0–15.0)
WBC: 7.5 10*3/uL (ref 3.8–10.8)

## 2016-06-27 NOTE — Assessment & Plan Note (Signed)
Continues on Imuran and Pentasa, clinically doing well. Due for labs at this point. Will retrieve most recent DEXA scan. Return in 6 months.

## 2016-06-29 ENCOUNTER — Other Ambulatory Visit: Payer: Self-pay | Admitting: Gastroenterology

## 2016-07-01 ENCOUNTER — Other Ambulatory Visit: Payer: Self-pay

## 2016-07-01 ENCOUNTER — Ambulatory Visit: Payer: Medicare Other | Admitting: Neurology

## 2016-07-01 NOTE — Progress Notes (Signed)
LFTs stable. Hgb stable. She has elevated MCV and MCH. We need to order B12, TSH, folate.

## 2016-07-01 NOTE — Progress Notes (Signed)
CC'D TO PCP °

## 2016-07-02 NOTE — Progress Notes (Signed)
Noted  

## 2016-07-03 ENCOUNTER — Other Ambulatory Visit: Payer: Self-pay | Admitting: Gastroenterology

## 2016-07-03 ENCOUNTER — Other Ambulatory Visit: Payer: Self-pay

## 2016-07-03 DIAGNOSIS — K50019 Crohn's disease of small intestine with unspecified complications: Secondary | ICD-10-CM

## 2016-07-03 DIAGNOSIS — R718 Other abnormality of red blood cells: Secondary | ICD-10-CM

## 2016-07-03 DIAGNOSIS — R5383 Other fatigue: Secondary | ICD-10-CM

## 2016-07-04 ENCOUNTER — Ambulatory Visit
Admission: RE | Admit: 2016-07-04 | Discharge: 2016-07-04 | Disposition: A | Discharge status: Home / Self Care | Payer: Medicare Other | Source: Ambulatory Visit | Attending: Neurology | Admitting: Neurology

## 2016-07-04 DIAGNOSIS — G3184 Mild cognitive impairment, so stated: Secondary | ICD-10-CM

## 2016-07-04 DIAGNOSIS — R292 Abnormal reflex: Secondary | ICD-10-CM

## 2016-07-04 MED ORDER — GADOBENATE DIMEGLUMINE 529 MG/ML IV SOLN: 18.0000 mL | Freq: Once | INTRAVENOUS | Status: DC | PRN

## 2016-07-10 ENCOUNTER — Telehealth: Payer: Self-pay | Admitting: Gastroenterology

## 2016-07-10 NOTE — Telephone Encounter (Signed)
I sent a request to PCP to re fax it

## 2016-07-10 NOTE — Telephone Encounter (Signed)
Manuela Schwartz:  I received DEXA scan results but I couldn't read it. Can they send it over so it's clearer? Thanks!

## 2016-07-12 ENCOUNTER — Telehealth: Payer: Self-pay

## 2016-07-12 NOTE — Telephone Encounter (Signed)
-----   Message from Cameron Sprang, MD sent at 07/12/2016  6:05 AM EDT ----- Pls let her know brain MRI is unremarkable, no evidence of tumor, stroke, or bleed. The neck MRI shows arthritis changes at several levels, some potentially pressing on the nerves likely causing her neck pain and headaches. Physical therapy can help, does she want a referral? Thanks

## 2016-07-12 NOTE — Telephone Encounter (Signed)
Spoke with pt and relayed message below.  She was hoping that the MRI would show why she is having memory issues, but was appreciative of the results. I asked about physical therapy for her neck pain and headaches.  Pt declined at this time stating that she has gone multiple times in the past for the same issue and it just added to her frustration.  Pt knows to contact our office for referral if she changes her mind.

## 2016-07-16 NOTE — Progress Notes (Signed)
DEXA scan received from March 2015, low risk of fracture and normal score.  She is due for one now as it has been 3 years. Maybe she had one in 2018? My records only have 69.

## 2016-07-30 ENCOUNTER — Other Ambulatory Visit: Payer: Self-pay | Admitting: Gastroenterology

## 2016-07-30 NOTE — Progress Notes (Signed)
Pt said she hasnt had one since 2015. She has them done at Dr.Shah's office. She will contact them to schedule one. She also has not had her B-12 blood work done yet because she went out of town but will have it done as soon as she can.

## 2016-08-19 ENCOUNTER — Encounter: Payer: Self-pay | Admitting: Psychology

## 2016-08-19 ENCOUNTER — Ambulatory Visit (INDEPENDENT_AMBULATORY_CARE_PROVIDER_SITE_OTHER): Payer: Medicare Other | Admitting: Psychology

## 2016-08-19 DIAGNOSIS — F32A Depression, unspecified: Secondary | ICD-10-CM

## 2016-08-19 DIAGNOSIS — R413 Other amnesia: Secondary | ICD-10-CM

## 2016-08-19 DIAGNOSIS — F329 Major depressive disorder, single episode, unspecified: Secondary | ICD-10-CM

## 2016-08-19 NOTE — Progress Notes (Signed)
NEUROPSYCHOLOGICAL INTERVIEW (CPT: [id number])  Name: Sheri [name] Date of Birth: Apr 11, 1950 Date of Interview: 08/19/2016  Reason for Referral:  Sheri [name] is a 66 y.o. right handed female who is referred for neuropsychological evaluation by Dr. Ellouise Newer of [facility] due to concerns about [name] loss and possible dementia. This patient is accompanied in the office by her daughter, Sheri [name], who supplements the history.  History of Presenting Problem:  Sheri [name] was seen for neurologic consultation by Dr. Delice Lesch on 06/21/2016. MoCA was 26/30. MRI brain completed on 07/04/2016 reportedly revealed no acute intracranial abnormality or abnormal enhancement of the brain, and minimal chronic microvascular ischemic changes.  At today's appointment, the patient and her daughter reports [name] decline over the past 6 months to a year with initial onset over a year ago. They reported gradual onset with progressive worsening over time. Upon direct questioning, they endorsed the following symptoms: Forgetfulness for recent conversations and events, repetition of statements and questions, frequently misplacing her losing items, difficulty concentrating, increased distractibility, slowed mental processing speed, word finding difficulty, word substitutions in speech, and difficulty with directions when driving. They denied comprehension difficulty. Her daughter noted that on several occasions she has been looking for something in the refrigerator and can't see it but it is right in front of her.  There is no family history of dementia that she knows of.  The patient is not working; she is on disability for Crohn's disease and fibromyalgia. She reports that she has not worked in 8-10 years. She reports that her Crohn's disease and fibromyalgia have been relatively well managed with pain medication that she has been prescribed since having back surgery. However, she reported that she will likely be taken off  pain medications at some point and as a result she is concerned that she will have significant difficulty with her Crohn's and fibromyalgia.  She lives with her husband in their own home. She does very little driving. She reported that she frequently forgets where she is going while she is driving. She was having significant difficulty with her medications in the past but has a better system set up now. However, if she gets distracted while taking her medications she has no [name] of whether or not she has taken them. She reports that she does manage the finances and bills but that she is having problems with this and has made errors. She also has missed appointments due to forgetting direct them down; her daughter is trying to stay on top of her appointments. She is not cooking as much as she used to, maybe 1 or 2 times a week, and endorses difficulty recalling how to make items or getting distracted in the middle of preparing them.   Physically, she complains of unsteadiness and frequent stumbling. She is only fallen once recently. It is unclear what happened but she did not hit her head or lose consciousness. She does have a history of 1 concussion at least 15 years ago when she fell off a porch.   Her sleep is variable. She reported that she is often up and down a lot and the middle of the night and pain can impact her sleep. She often sleeps in late. She denies any difficulty with appetite but notes a burning sensation on her tongue with intake of any liquid or food. She also has dry mouth secondary to medications. She drinks a lot of water, probably 1-2 gallons a day, but her daughter notes that her  urine is still very dark in color.  The patient reported that overall she feels her mood is pretty good but she says have spells where she gets frustrated and distressed. Her daughter feels that she is "grumpy" more often. The patient is very frustrated by her cognitive difficulties and inability to  complete tasks as efficiently and quickly as she used to.  She did see a mental health counselor somewhat recently who told her she thought she might be depressed. The patient admits that she does feel some depression but it is not constant. She endorses increased anxiety. Her daughter notes that she seems "more paranoid" in that she recently was feeling that people were talking about her, when this has never been a concern in the past.  The patient denied suicidal ideation or intention.  She reported a remote history of depression treated with Librium many years ago.   She denied any history of substance abuse or dependence.   Social History: Born/Raised: [place]  Education: High school Occupational history: Previously Dealer work, on disability for several years Marital history: Married, 1 daughter, 1 son. 2 granddaughters.  Alcohol: No Tobacco: Never   Medical History: Past Medical History:  Diagnosis Date  . Anemia   . Chronic back pain   . Chronic neck pain   . Complication of anesthesia   . Crohn disease ([place])   . Fibromyalgia   . GERD (gastroesophageal reflux disease)   . Hypertension   . Low back pain   . Osteopenia    Last DEXA 12/2010 was normal, on fosamax  . PONV (postoperative nausea and vomiting)       Current Medications:  Outpatient Encounter Prescriptions as of 08/19/2016  Medication Sig  . azaTHIOprine (IMURAN) 50 MG tablet TAKE 2 & 1/2 TABLETS DAILY.  Marland Kitchen azaTHIOprine (IMURAN) 50 MG tablet TAKE 2 & 1/2 TABLETS DAILY.  . Calcium Carbonate-Vitamin D (CALCIUM 600+D PO) Take by mouth 2 (two) times daily.    . Cyanocobalamin (VITAMIN B-12 IJ) Inject as directed every 14 (fourteen) days.   . cyclobenzaprine (FLEXERIL) 5 MG tablet Take 5 mg by mouth 3 (three) times daily.  . fish oil-omega-3 fatty acids 1000 MG capsule Take 2 g by mouth daily.    Marland Kitchen lisinopril-hydrochlorothiazide (PRINZIDE,ZESTORETIC) 20-12.5 MG per tablet Take 1 tablet by mouth  daily.  . Loratadine 10 MG CAPS Take 10 mg by mouth daily.   . multivitamin (THERAGRAN) per tablet Take 1 tablet by mouth daily.    Marland Kitchen omeprazole (PRILOSEC) 20 MG capsule Take 20 mg by mouth daily.    . ondansetron (ZOFRAN) 4 MG tablet Take 4 mg by mouth. ONE BY MOUTH EVERY 4-6 HOURS AS NEEDED FOR NAUSEA AND VOMITING   . oxyCODONE (ROXICODONE) 15 MG immediate release tablet Take 15 mg by mouth every 4 (four) hours as needed.   Marland Kitchen PENTASA 500 MG CR capsule TAKE (2) CAPSULES FOUR TIMES DAILY.  . vitamin E 1000 UNIT capsule Take 1,000 Units by mouth daily.    Marland Kitchen zinc gluconate 50 MG tablet Take 50 mg by mouth daily.     No facility-administered encounter medications on file as of 08/19/2016.      Behavioral Observations:   Appearance: Neatly and appropriately dressed and groomed Gait: Ambulated independently, no gross abnormalities observed Speech: Fluent; normal rate, rhythm and volume. Thought process: Generally linear.  Affect: Mildly blunted, mildly dysthymic Interpersonal: Pleasant, appropriate   TESTING: There is medical necessity to proceed with neuropsychological assessment as the results  will be used to aid in differential diagnosis and clinical decision-making and to inform specific treatment recommendations. Per the patient, her daughter and medical records reviewed, there has been a change in cognitive functioning and a reasonable suspicion of neurocognitive disorder or dementia.  Following the clinical interview, the patient completed a full battery of neuropsychological testing with my psychometrician under my supervision.   PLAN: The patient will return to see me for a follow-up session at which time her test performances and my impressions and treatment recommendations will be reviewed in detail.  Full report to follow.

## 2016-08-19 NOTE — Progress Notes (Signed)
   Neuropsychology Note  Sheri [name] came in today for 1 hour of neuropsychological testing with technician, Milana Kidney, BS, under the supervision of Dr. Macarthur Critchley. The patient did not appear overtly distressed by the testing session, per behavioral observation or via self-report to the technician. Rest breaks were offered. Sheri [name] will return within 2 weeks for a feedback session with Dr. Si Raider at which time her test performances, clinical impressions and treatment recommendations will be reviewed in detail. The patient understands she can contact our office should she require our assistance before this time.  Full report to follow.

## 2016-09-09 ENCOUNTER — Encounter (HOSPITAL_COMMUNITY): Payer: Self-pay | Admitting: *Deleted

## 2016-09-09 ENCOUNTER — Emergency Department (HOSPITAL_COMMUNITY)
Admission: EM | Admit: 2016-09-09 | Discharge: 2016-09-10 | Disposition: A | Discharge status: Home / Self Care | Payer: Medicare Other | Attending: Emergency Medicine | Admitting: Emergency Medicine

## 2016-09-09 DIAGNOSIS — M62838 Other muscle spasm: Secondary | ICD-10-CM | POA: Diagnosis not present

## 2016-09-09 DIAGNOSIS — E876 Hypokalemia: Secondary | ICD-10-CM | POA: Diagnosis not present

## 2016-09-09 DIAGNOSIS — M542 Cervicalgia: Secondary | ICD-10-CM

## 2016-09-09 DIAGNOSIS — I10 Essential (primary) hypertension: Secondary | ICD-10-CM | POA: Diagnosis not present

## 2016-09-09 DIAGNOSIS — Z79899 Other long term (current) drug therapy: Secondary | ICD-10-CM | POA: Insufficient documentation

## 2016-09-09 NOTE — Progress Notes (Signed)
NEUROPSYCHOLOGICAL EVALUATION   Name:    Sheri [name]  Date of Birth:   1950-11-19 Date of Interview:  08/19/2016 Date of Testing:  08/19/2016   Date of Feedback:  09/10/2016       Background Information:  Reason for Referral:  Sheri [name] is a 66 y.o. right handed female referred by Dr. Ellouise Newer to assess her current level of cognitive functioning and assist in differential diagnosis. The current evaluation consisted of a review of available medical records, an interview with the patient and her daughter, Sheri [name], and the completion of a neuropsychological testing battery. Informed consent was obtained.  History of Presenting Problem:  Sheri [name] was seen for neurologic consultation by Dr. Delice Lesch on 06/21/2016. MoCA was 26/30. MRI brain completed on 07/04/2016 reportedly revealed no acute intracranial abnormality or abnormal enhancement of the brain, and minimal chronic microvascular ischemic changes.  At today's appointment, the patient and her daughter report memory decline over the past 6 months to a year with initial onset over a year ago. They reported gradual onset with progressive worsening over time. Upon direct questioning, they endorsed the following symptoms: Forgetfulness for recent conversations and events, repetition of statements and questions, frequently misplacing her losing items, difficulty concentrating, increased distractibility, slowed mental processing speed, word finding difficulty, word substitutions in speech, and difficulty with directions when driving. They denied comprehension difficulty. Her daughter noted that on several occasions she has been looking for something in the refrigerator and can't see it but it is right in front of her.  There is no family history of dementia that she knows of.  The patient is not working; she is on disability for Crohn's disease and fibromyalgia. She reports that she has not worked in 8-10 years. She reports that her Crohn's  disease and fibromyalgia have been relatively well managed with pain medication that she has been prescribed since having back surgery. However, she reported that she will likely be taken off pain medications at some point and as a result she is concerned that she will have significant difficulty with her Crohn's and fibromyalgia.  She lives with her husband in their own home. She does very little driving. She reported that she frequently forgets where she is going while she is driving. She was having significant difficulty with her medications in the past but has a better system set up now. However, if she gets distracted while taking her medications she has no memory of whether or not she has taken them. She reports that she does manage the finances and bills but that she is having problems with this and has made errors. She also has missed appointments due to forgetting to write them down; her daughter is trying to stay on top of her appointments. She is not cooking as much as she used to, maybe 1 or 2 times a week, and endorses difficulty recalling how to make items or getting distracted in the middle of preparing them.   Physically, she complains of unsteadiness and frequent stumbling. She has only fallen once recently. It is unclear what happened but she did not hit her head or lose consciousness. She does have a history of 1 concussion at least 15 years ago when she fell off a porch.  Her sleep is variable. She reported that she is often up and down a lot and the middle of the night and pain can impact her sleep. She often sleeps in late. She denies any difficulty with appetite  but notes a burning sensation on her tongue with intake of any liquid or food. She also has dry mouth secondary to medications. She drinks a lot of water, probably 1-2 gallons a day, but her daughter notes that her urine is still very dark in color.  The patient reported that overall she feels her mood is pretty good but  she has spells where she gets frustrated and distressed. Her daughter feels that she is "grumpy" more often. The patient is very frustrated by her cognitive difficulties and inability to complete tasks as efficiently and quickly as she used to.  She did see a mental health counselor somewhat recently who told her she thought she might be depressed. The patient admits that she does feel some depression but it is not constant. She endorses increased anxiety. Her daughter notes that she seems "more paranoid" in that she recently was feeling that people were talking about her, when this has never been a concern in the past.  The patient denied suicidal ideation or intention.  She reported a remote history of depression treated with Librium many years ago.   She denied any history of substance abuse or dependence.   Social History: Born/Raised: [place]  Education: High school Occupational history: Previously Dealer work, on disability for several years Marital history: Married, 1 daughter, 1 son. 2 granddaughters.  Alcohol: No Tobacco: Never   Medical History:  Past Medical History:  Diagnosis Date  . Anemia   . Chronic back pain   . Chronic neck pain   . Complication of anesthesia   . Crohn disease ([place])   . Fibromyalgia   . GERD (gastroesophageal reflux disease)   . Hypertension   . Low back pain   . Osteopenia    Last DEXA 12/2010 was normal, on fosamax  . PONV (postoperative nausea and vomiting)     Current medications:  Outpatient Encounter Prescriptions as of 09/10/2016  Medication Sig  . azaTHIOprine (IMURAN) 50 MG tablet TAKE 2 & 1/2 TABLETS DAILY.  Marland Kitchen azaTHIOprine (IMURAN) 50 MG tablet TAKE 2 & 1/2 TABLETS DAILY.  . Calcium Carbonate-Vitamin D (CALCIUM 600+D PO) Take by mouth 2 (two) times daily.    . Cyanocobalamin (VITAMIN B-12 IJ) Inject as directed every 14 (fourteen) days.   . cyclobenzaprine (FLEXERIL) 5 MG tablet Take 5 mg by mouth 3 (three) times  daily.  . fish oil-omega-3 fatty acids 1000 MG capsule Take 2 g by mouth daily.    Marland Kitchen lisinopril-hydrochlorothiazide (PRINZIDE,ZESTORETIC) 20-12.5 MG per tablet Take 1 tablet by mouth daily.  . Loratadine 10 MG CAPS Take 10 mg by mouth daily.   . multivitamin (THERAGRAN) per tablet Take 1 tablet by mouth daily.    Marland Kitchen omeprazole (PRILOSEC) 20 MG capsule Take 20 mg by mouth daily.    . ondansetron (ZOFRAN) 4 MG tablet Take 4 mg by mouth. ONE BY MOUTH EVERY 4-6 HOURS AS NEEDED FOR NAUSEA AND VOMITING   . oxyCODONE (ROXICODONE) 15 MG immediate release tablet Take 15 mg by mouth every 4 (four) hours as needed.   Marland Kitchen PENTASA 500 MG CR capsule TAKE (2) CAPSULES FOUR TIMES DAILY.  . vitamin E 1000 UNIT capsule Take 1,000 Units by mouth daily.    Marland Kitchen zinc gluconate 50 MG tablet Take 50 mg by mouth daily.     No facility-administered encounter medications on file as of 09/10/2016.     Current Examination:  Behavioral Observations:   Appearance: Neatly and appropriately dressed and groomed Gait: Ambulated  independently, no gross abnormalities observed Speech: Fluent; normal rate, rhythm and volume. Thought process: Generally linear.  Affect: Mildly blunted, mildly dysthymic Interpersonal: Pleasant, appropriate Orientation: Oriented to person, place and most aspects of time (did not know the current date but knew the month, year and day of the week). Accurately named the current President and his predecessor.    Tests Administered: . Test of Premorbid Functioning (TOPF) . Wechsler Adult Intelligence Scale-Fourth Edition (WAIS-IV): Similarities, Music therapist, Coding and Digit Span subtests . Wechsler Memory Scale-Fourth Edition (WMS-IV) Older Adult Version (ages 84-90): Logical Memory I, II and Recognition subtests  . [employer] Verbal Learning Test - 2nd Edition (CVLT-2) Short Form . Repeatable Battery for the Assessment of Neuropsychological Status (RBANS) Form A:  Figure Copy and Recall subtests and  Semantic Fluency subtest . Neuropsychological Assessment Battery (NAB) Language Module, Form 1: Naming subtest . [place] Diagnostic Aphasia Examination: Complex Ideational Material subtest . Controlled Oral Word Association Test (COWAT) . Trail Making Test A and B . Clock drawing test . Geriatric Depression Scale (GDS) 15 Item . Generalized Anxiety Disorder - 7 item screener (GAD-7)  Test Results: Note: Standardized scores are presented only for use by appropriately trained professionals and to allow for any future test-retest comparison. These scores should not be interpreted without consideration of all the information that is contained in the rest of the report. The most recent standardization samples from the test publisher or other sources were used whenever possible to derive standard scores; scores were corrected for age, gender, ethnicity and education when available.   Test Scores:  Test Name Raw Score Standardized Score Descriptor  TOPF 23/70 SS= 83 Low average  WAIS-IV Subtests     Similarities 13/36 ss= 5 Borderline  Block Design 24/66 ss= 8 Low end of average  Coding 50/135 ss= 9 Average  Digit Span Forward 8/16 ss= 8 Low end of average  Digit Span Backward 10/16 ss= 12 High average  WMS-IV Subtests     LM I 20/53 ss= 6 Low average  LM II 7/39 ss= 5 Borderline  LM II Recognition 17/23 Cum %: 17-25   RBANS Subtests     Figure Copy 20/20 Z= 1.1 High average  Figure Recall 13/20 Z= -0.2 Average  Semantic Fluency 12/40 Z= -2 Impaired  CVLT-II Scores     Trial 1 3/9 Z= -2.5 Impaired  Trial 4 5/9 Z= -2.5 Impaired  Trials 1-4 total 16/36 T= 24 Severely impaired  SD Free Recall 6/9 Z= -1 Low average  LD Free Recall 0/9 Z= -2.5 Impaired  LD Cued Recall 0/9 Z= -3.5 Severely impaired  Recognition Discriminability 8/9 hits, 0 false positives Z= 0 Average  Forced Choice Recognition 9/9  WNL  NAB Language subtest     Naming 31/31 T= 58 High average  BDAE Subtest     Complex  Ideational Material 8/12  Impaired  COWAT-FAS 35 T= 50 Average  COWAT-Animals 10 T= 35 Borderline  Trail Making Test A  46" 0 errors T= 41 Low average  Trail Making Test B  89" 0 errors T= 48 Average  Clock Drawing   WNL  GDS-15 9/15  Moderate  GAD-7 20/21  Severe      Description of Test Results:  Premorbid verbal intellectual abilities were estimated to have been within the low average range based on a test of word reading. Psychomotor processing speed was average to low average. Auditory attention and working memory were average to high average, respectively. Visual-spatial construction was average.  Language abilities were variable. Specifically, confrontation naming was high average, and semantic verbal fluency was impaired to borderline impaired. Auditory comprehension of complex ideational material was impaired. With regard to verbal memory, encoding and acquisition of non-contextual information (i.e., word list) was severely impaired. After a brief distracter task, free recall was low average (6/9 items recalled). After a delay, free recall was impaired (0/9 items recalled). Cued recall was severely impaired (0/9 items recalled). Performance on a yes/no recognition task was average. On another verbal memory test, encoding and acquisition of contextual auditory information (i.e., short stories) was low average. After a delay, free recall was borderline impaired. Performance on a yes/no recognition task was low average. With regard to non-verbal memory, delayed free recall of visual information was average. Executive functioning was intact for the most part. Mental flexibility and set-shifting were average on Trails B. Verbal fluency with phonemic search restrictions was average. Verbal abstract reasoning was borderline impaired. Performance on a clock drawing task was intact (of note, the patient reported that she was given this task a few months ago and could not figure out how to do it, so  she went home and practiced and that is why she was able to do it today). On a self-report measure of mood, the patient's responses were indicative of clinically significant depression and anxiety at the present time.    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 Results of neurocognitive testing revealed deficits in encoding/retrieval of verbal information, semantic fluency, verbal abstract reasoning and comprehension of complex ideational material. 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 However, there are other factors that could contribute to cognitive dysfunction in this patient, including opioid medication use and significant depression/anxiety.     Recommendations/Plan: Based on the findings of the present evaluation, the following recommendations are offered:  1. Treatment of depression and anxiety is clearly indicated. I would recommend she see a geriatric psychiatrist. She may also benefit from counseling with a psychologist/licensed counselor but she would need to use compensatory strategies (eg written notes, recorded sessions) to remind her of what they discuss and what she is to practice in between sessions.  2. Because her MCI may represent underlying/prodromal AD, cholinesterase inhibitor therapy could be considered. 3. Her cognitive functioning should continue to be monitored over time. I recommend neuropsychological re-evaluation in 1 year in order to track any progression of cognitive symptoms and further assist with treatment planning. 4. Written information on MCI was provided to the patient and her daughter.  Feedback to Patient: Sheri [name] returned for a feedback appointment on 09/10/2016 to review the results of her neuropsychological evaluation with this provider. 30 minutes face-to-face time  was spent reviewing her test results, my impressions and my recommendations as detailed above.    Total time spent on this patient's case: [id number] unit for interview with psychologist; [phone] units of testing by psychometrician under psychologist's supervision; [phone] units for medical record review, scoring of neuropsychological tests, interpretation of test results, preparation of this report, and review of results to the patient by psychologist.      Thank you for your referral of Sheri [name]. Please feel free to contact me if you have any questions or concerns regarding this report.

## 2016-09-09 NOTE — ED Triage Notes (Signed)
Pt c/o neck pain that started x 24 hours ago and has gotten worse;

## 2016-09-10 ENCOUNTER — Encounter: Payer: Self-pay | Admitting: Psychology

## 2016-09-10 ENCOUNTER — Ambulatory Visit (INDEPENDENT_AMBULATORY_CARE_PROVIDER_SITE_OTHER): Payer: Medicare Other | Admitting: Psychology

## 2016-09-10 ENCOUNTER — Other Ambulatory Visit: Payer: Self-pay

## 2016-09-10 DIAGNOSIS — R413 Other amnesia: Secondary | ICD-10-CM

## 2016-09-10 DIAGNOSIS — M542 Cervicalgia: Secondary | ICD-10-CM | POA: Diagnosis not present

## 2016-09-10 DIAGNOSIS — G3184 Mild cognitive impairment, so stated: Secondary | ICD-10-CM

## 2016-09-10 LAB — COMPREHENSIVE METABOLIC PANEL
ALBUMIN: 3.4 g/dL — AB (ref 3.5–5.0)
ALT: 17 U/L (ref 14–54)
AST: 22 U/L (ref 15–41)
Alkaline Phosphatase: 83 U/L (ref 38–126)
Anion gap: 10 (ref 5–15)
BILIRUBIN TOTAL: 1.8 mg/dL — AB (ref 0.3–1.2)
BUN: 14 mg/dL (ref 6–20)
CHLORIDE: 100 mmol/L — AB (ref 101–111)
CO2: 23 mmol/L (ref 22–32)
Calcium: 9.2 mg/dL (ref 8.9–10.3)
Creatinine, Ser: 0.76 mg/dL (ref 0.44–1.00)
GFR calc Af Amer: >60 mL/min (ref >60)
GFR calc non Af Amer: >60 mL/min (ref >60)
GLUCOSE: 112 mg/dL — AB (ref 65–99)
POTASSIUM: 2.7 mmol/L — AB (ref 3.5–5.1)
Sodium: 133 mmol/L — ABNORMAL LOW (ref 135–145)
Total Protein: 7 g/dL (ref 6.5–8.1)

## 2016-09-10 LAB — CBC WITH DIFFERENTIAL/PLATELET
BASOS PCT: 0 %
Basophils Absolute: 0 10*3/uL (ref 0.0–0.1)
Eosinophils Absolute: 1.1 10*3/uL — ABNORMAL HIGH (ref 0.0–0.7)
Eosinophils Relative: 9 %
HEMATOCRIT: 33.7 % — AB (ref 36.0–46.0)
Hemoglobin: 12.1 g/dL (ref 12.0–15.0)
Lymphocytes Relative: 22 %
Lymphs Abs: 2.4 10*3/uL (ref 0.7–4.0)
MCH: 34.9 pg — ABNORMAL HIGH (ref 26.0–34.0)
MCHC: 35.9 g/dL (ref 30.0–36.0)
MCV: 97.1 fL (ref 78.0–100.0)
MONOS PCT: 10 %
Monocytes Absolute: 1.1 10*3/uL — ABNORMAL HIGH (ref 0.1–1.0)
NEUTROS ABS: 6.7 10*3/uL (ref 1.7–7.7)
NEUTROS PCT: 59 %
Platelets: 220 10*3/uL (ref 150–400)
RBC: 3.47 MIL/uL — ABNORMAL LOW (ref 3.87–5.11)
RDW: 13.4 % (ref 11.5–15.5)
WBC: 11.3 10*3/uL — ABNORMAL HIGH (ref 4.0–10.5)

## 2016-09-10 MED ORDER — MAGNESIUM SULFATE IN D5W 1-5 GM/100ML-% IV SOLN
INTRAVENOUS | Status: AC
  Administered 2016-09-10: 2 g via INTRAVENOUS
  Filled 2016-09-10: qty 200

## 2016-09-10 MED ORDER — LIDOCAINE 5 % EX PTCH: 1.0000 | MEDICATED_PATCH | CUTANEOUS | 0 refills | Status: DC

## 2016-09-10 MED ORDER — POTASSIUM CHLORIDE CRYS ER 20 MEQ PO TBCR: 20.0000 meq | EXTENDED_RELEASE_TABLET | Freq: Two times a day (BID) | ORAL | 0 refills | Status: DC

## 2016-09-10 MED ORDER — POTASSIUM CHLORIDE 10 MEQ/100ML IV SOLN
10.0000 meq | INTRAVENOUS | Status: AC
Start: 2016-09-10 — End: 2016-09-10
  Administered 2016-09-10 (×2): 10 meq via INTRAVENOUS
  Filled 2016-09-10 (×2): qty 100

## 2016-09-10 MED ORDER — MAGNESIUM SULFATE 4 GM/100ML IV SOLN: 2.0000 g | Freq: Once | INTRAVENOUS | Status: DC

## 2016-09-10 MED ORDER — MAGNESIUM SULFATE IN D5W 1-5 GM/100ML-% IV SOLN
1.0000 g | INTRAVENOUS | Status: AC
  Administered 2016-09-10: 2 g via INTRAVENOUS
  Administered 2016-09-10 (×2): 1 g via INTRAVENOUS

## 2016-09-10 MED ORDER — DIAZEPAM 5 MG PO TABS
5.0000 mg | ORAL_TABLET | Freq: Once | ORAL | Status: AC
  Administered 2016-09-10: 5 mg via ORAL
  Filled 2016-09-10: qty 1

## 2016-09-10 MED ORDER — LIDOCAINE 5 % EX PTCH
1.0000 | MEDICATED_PATCH | CUTANEOUS | Status: DC
  Administered 2016-09-10: 1 via TRANSDERMAL
  Filled 2016-09-10 (×3): qty 1

## 2016-09-10 MED ORDER — POTASSIUM CHLORIDE CRYS ER 20 MEQ PO TBCR
40.0000 meq | EXTENDED_RELEASE_TABLET | Freq: Two times a day (BID) | ORAL | Status: DC
  Administered 2016-09-10: 40 meq via ORAL
  Filled 2016-09-10: qty 2

## 2016-09-10 MED ORDER — DEXAMETHASONE SODIUM PHOSPHATE 4 MG/ML IJ SOLN
10.0000 mg | Freq: Once | INTRAMUSCULAR | Status: AC
  Administered 2016-09-10: 10 mg via INTRAVENOUS
  Filled 2016-09-10: qty 3

## 2016-09-10 MED ORDER — PREGABALIN 50 MG PO CAPS: 50.0000 mg | ORAL_CAPSULE | Freq: Two times a day (BID) | ORAL | 0 refills | Status: DC

## 2016-09-10 MED ORDER — MORPHINE SULFATE (PF) 4 MG/ML IV SOLN
4.0000 mg | Freq: Once | INTRAVENOUS | Status: AC
  Administered 2016-09-10: 4 mg via INTRAVENOUS

## 2016-09-10 MED ORDER — PROCHLORPERAZINE EDISYLATE 5 MG/ML IJ SOLN
5.0000 mg | Freq: Once | INTRAMUSCULAR | Status: AC
  Administered 2016-09-10: 5 mg via INTRAVENOUS
  Filled 2016-09-10: qty 2

## 2016-09-10 MED ORDER — MORPHINE SULFATE (PF) 4 MG/ML IV SOLN
INTRAVENOUS | Status: AC
  Filled 2016-09-10: qty 1

## 2016-09-10 MED ORDER — DIAZEPAM 5 MG PO TABS: 5.0000 mg | ORAL_TABLET | Freq: Two times a day (BID) | ORAL | 0 refills | Status: DC | PRN

## 2016-09-10 MED ORDER — DIPHENHYDRAMINE HCL 50 MG/ML IJ SOLN
12.5000 mg | Freq: Once | INTRAMUSCULAR | Status: AC
  Administered 2016-09-10: 12.5 mg via INTRAVENOUS
  Filled 2016-09-10: qty 1

## 2016-09-10 NOTE — Patient Instructions (Addendum)
Results of your cognitive evaluation were consistent with Mild Cognitive Impairment (MCI). There is also evidence of significant depression and anxiety.  I have given you written information on MCI.   I have the following recommendations:  1. Treatment of depression and anxiety is clearly indicated. I would recommend she see a geriatric psychiatrist. I will place a referral to Dr. Casimiro Needle and his office will call you. I do think you would benefit from counseling with a psychologist/licensed counselor but you would need to use compensatory strategies (eg written notes, recorded sessions) to remind you of what you discuss and what you are to practice in between sessions.  2. We will continue to monitor your cognitive functioning. I recommend neuropsychological re-evaluation in 1 year in order to track any progression of cognitive symptoms and further assist with treatment planning. 3. You will follow up with Dr. Delice Lesch ASAP and her assistant will contact you to answer your questions about the head pain.

## 2016-09-10 NOTE — ED Provider Notes (Signed)
[facility] DEPT Provider Note   CSN: [id number] Arrival date & time: 09/09/16  2351     History   Chief Complaint Chief Complaint  Patient presents with  . Neck Pain    HPI Sheri [name] is a 66 y.o. female.  HPI  This is a 66 year old female with a history of chronic pain, Crohn's disease, fibromyalgia, hypertension who presents with neck pain and spasm. Patient reports that on Friday she began to have vomiting and diarrhea. She relates this to her Crohn's. She had several days of vomiting and diarrhea. This began to resolve; however, yesterday morning she woke up and has had progressive neck pain. She has a history of chronic neck pain but she states this is somewhat different and shoots up into her posterior head. It is worse with turning of the head. She denies new injury. She denies fevers. She has taken her chronic pain medications and Flexeril at home with minimal relief. Current pain is rated at 10 out of 10. She denies any weakness, numbness, tingling of the upper or lower extremities.  Past Medical History:  Diagnosis Date  . Anemia   . Chronic back pain   . Chronic neck pain   . Complication of anesthesia   . Crohn disease ([place])   . Fibromyalgia   . GERD (gastroesophageal reflux disease)   . Hypertension   . Low back pain   . Osteopenia    Last DEXA 12/2010 was normal, on fosamax  . PONV (postoperative nausea and vomiting)     Patient Active Problem List   Diagnosis Date Noted  . Mild cognitive impairment 06/21/2016  . Hyperreflexia 06/21/2016  . Dysuria 01/09/2011  . Crohn's disease of both small and large intestine without complication ([place]) 00/93/8182  . ABDOMINAL PAIN 03/30/2009  . GERD 04/29/2008  . OSTEOPENIA 04/29/2008  . ABSCESS, SPINE 09/11/2007  . HYPERTENSION 09/11/2007  . CROHN'S DISEASE, SMALL INTESTINE 09/11/2007  . SKIN LESION 09/11/2007  . DIARRHEA 09/11/2007  . RECTAL BLEEDING, HX OF 09/11/2007  . Anemia 11/19/2005  . FIBROMYALGIA  11/19/2005  . HYPERGLYCEMIA, HX OF 11/19/2005  . LOW BACK PAIN 11/12/2005  . INFECTION, STAPHYLOCOCCUS AUREUS 04/08/2005  . OSTEOMYELITIS, ACUTE, OTHER [facility] SITE 04/08/2005    Past Surgical History:  Procedure Laterality Date  . APPENDECTOMY  1990  . CATARACT EXTRACTION W/PHACO Left 01/13/2014   Procedure: CATARACT EXTRACTION PHACO AND INTRAOCULAR LENS PLACEMENT LEFT EYE;  Surgeon: Tonny Branch, MD;  Location: [facility] ORS;  Service: Ophthalmology;  Laterality: Left;  CDE:6.90  . CATARACT EXTRACTION W/PHACO Right 01/24/2014   Procedure: CATARACT EXTRACTION PHACO AND INTRAOCULAR LENS PLACEMENT (IOC);  Surgeon: Tonny Branch, MD;  Location: [facility];  Service: Ophthalmology;  Laterality: Right;  CDE:5.91  . CESAREAN SECTION  [id number]  . COLONOSCOPY  09/11/2006  . COLONOSCOPY  10/30/2010   RMR: External and internal hemorrhoids, likely source of hematochezia  otherwise normal rectum/ Left-sided diverticula, status post prior right hemicolectomy with a friable, inflamed, stenotic surgical anastomosis with upstream dilation of the neoterminal ileum/ Crohn's noted, status post biopsy  . COLONOSCOPY N/A 06/23/2014   Dr. Rourk:friable eroded mucosa at the anastomosis with small bowel, consistent with some stenosis Crohn's disease s/p biopsy. Colonic diverticulosis and external hemorrhoids. Pathology with benign anastomotic mucosa, no dysplasia.   Marland Kitchen drainage of back abscess  2007  . ESOPHAGOGASTRODUODENOSCOPY  04/2009   noncritical schatzki's ring, small hh, sb bx negative  . SMALL INTESTINE SURGERY  [id number]    OB History  No data available       Home Medications    Prior to Admission medications   Medication Sig Start Date End Date Taking? Authorizing Provider  azaTHIOprine (IMURAN) 50 MG tablet TAKE 2 & 1/2 TABLETS DAILY. 06/26/15   Annitta Needs, NP  azaTHIOprine (IMURAN) 50 MG tablet TAKE 2 & 1/2 TABLETS DAILY. 07/30/16   Carlis Stable, NP  Calcium Carbonate-Vitamin D (CALCIUM 600+D PO) Take by  mouth 2 (two) times daily.      [provider]  Cyanocobalamin (VITAMIN B-12 IJ) Inject as directed every 14 (fourteen) days.     [provider]  cyclobenzaprine (FLEXERIL) 5 MG tablet Take 5 mg by mouth 3 (three) times daily.    [provider]  diazepam (VALIUM) 5 MG tablet Take 1 tablet (5 mg total) by mouth every 12 (twelve) hours as needed for muscle spasms. 09/10/16   Edelin Fryer, Barbette Hair, MD  fish oil-omega-3 fatty acids 1000 MG capsule Take 2 g by mouth daily.      [provider]  lidocaine (LIDODERM) 5 % Place 1 patch onto the skin daily. Remove & Discard patch within 12 hours or as directed by MD 09/10/16   Cimberly Stoffel, Barbette Hair, MD  lisinopril-hydrochlorothiazide (PRINZIDE,ZESTORETIC) 20-12.5 MG per tablet Take 1 tablet by mouth daily. 06/19/10   Mahala Menghini, PA-C  Loratadine 10 MG CAPS Take 10 mg by mouth daily.     [provider]  multivitamin [facility]) per tablet Take 1 tablet by mouth daily.      [provider]  omeprazole (PRILOSEC) 20 MG capsule Take 20 mg by mouth daily.      [provider]  ondansetron (ZOFRAN) 4 MG tablet Take 4 mg by mouth. ONE BY MOUTH EVERY 4-6 HOURS AS NEEDED FOR NAUSEA AND VOMITING     [provider]  oxyCODONE (ROXICODONE) 15 MG immediate release tablet Take 15 mg by mouth every 4 (four) hours as needed.  11/12/10   [provider]  PENTASA 500 MG CR capsule TAKE (2) CAPSULES FOUR TIMES DAILY. 07/01/16   Mahala Menghini, PA-C  potassium chloride SA (K-DUR,KLOR-CON) 20 MEQ tablet Take 1 tablet (20 mEq total) by mouth 2 (two) times daily. 09/10/16   Berley Gambrell, Barbette Hair, MD  vitamin E 1000 UNIT capsule Take 1,000 Units by mouth daily.      [provider]  zinc gluconate 50 MG tablet Take 50 mg by mouth daily.      [provider]    Family History Family History  Problem Relation Age of Onset  . Crohn's disease Mother        succumbed to stomach cancer in  her 21s  . Colon cancer Neg Hx     Social History Social History  Substance Use Topics  . Smoking status: Never Smoker  . Smokeless tobacco: Never Used  . Alcohol use No     Comment: has had one drink in the past year     Allergies   Codeine sulfate   Review of Systems Review of Systems  Constitutional: Negative for fever.  Respiratory: Negative for shortness of breath.   Cardiovascular: Negative for chest pain.  Gastrointestinal: Positive for diarrhea, nausea and vomiting. Negative for abdominal pain.  Musculoskeletal: Positive for neck pain and neck stiffness.  Neurological: Negative for weakness and numbness.  All other systems reviewed and are negative.    Physical Exam Updated Vital Signs BP 136/78   Pulse 100  Temp 99.7 F (37.6 C) (Oral)   Resp 16   Ht 5' 9"  (1.753 m)   Wt 88.5 kg (195 lb)   SpO2 94%   BMI 28.80 kg/m   Physical Exam  Constitutional: She is oriented to person, place, and time. She appears well-developed and well-nourished.  Uncomfortable appearing  HENT:  Head: Normocephalic and atraumatic.  Limited range of motion of the neck, tenderness to palpation and spasm noted over the bilateral trapezius muscles upwards into the occipital insert  Cardiovascular: Normal rate, regular rhythm and normal heart sounds.   No murmur heard. Pulmonary/Chest: Effort normal and breath sounds normal. No respiratory distress. She has no wheezes.  Abdominal: Soft. Bowel sounds are normal. There is no tenderness. There is no guarding.  Neurological: She is alert and oriented to person, place, and time.  5 out of 5 strength in bilateral upper extremities  Skin: Skin is warm and dry.  Psychiatric: She has a normal mood and affect.  Nursing note and vitals reviewed.    ED Treatments / Results  Labs (all labs ordered are listed, but only abnormal results are displayed) Labs Reviewed  CBC WITH DIFFERENTIAL/PLATELET - Abnormal; Notable for the following:        Result Value   WBC 11.3 (*)    RBC 3.47 (*)    HCT 33.7 (*)    MCH 34.9 (*)    Monocytes Absolute 1.1 (*)    Eosinophils Absolute 1.1 (*)    All other components within normal limits  COMPREHENSIVE METABOLIC PANEL - Abnormal; Notable for the following:    Sodium 133 (*)    Potassium 2.7 (*)    Chloride 100 (*)    Glucose, Bld 112 (*)    Albumin 3.4 (*)    Total Bilirubin 1.8 (*)    All other components within normal limits    EKG  EKG Interpretation None       Radiology No results found.  Procedures Procedures (including critical care time)  Medications Ordered in ED Medications  lidocaine (LIDODERM) 5 % 1 patch (1 patch Transdermal Patch Applied 09/10/16 0054)  potassium chloride SA (K-DUR,KLOR-CON) CR tablet 40 mEq (40 mEq Oral Given 09/10/16 0247)  diazepam (VALIUM) tablet 5 mg (5 mg Oral Given 09/10/16 0046)  potassium chloride 10 mEq in 100 mL IVPB (0 mEq Intravenous Stopped 09/10/16 0508)  dexamethasone (DECADRON) injection 10 mg (10 mg Intravenous Given 09/10/16 0259)  diphenhydrAMINE (BENADRYL) injection 12.5 mg (12.5 mg Intravenous Given 09/10/16 0259)  prochlorperazine (COMPAZINE) injection 5 mg (5 mg Intravenous Given 09/10/16 0302)  magnesium sulfate IVPB 1 g 100 mL (0 g Intravenous Stopped 09/10/16 0300)  morphine 4 MG/ML injection 4 mg (4 mg Intravenous Given 09/10/16 0334)     Initial Impression / Assessment and Plan / ED Course  I have reviewed the triage vital signs and the nursing notes.  Pertinent labs & imaging results that were available during my care of the patient were reviewed by me and considered in my medical decision making (see chart for details).  Clinical Course as of Sep 11 518  Tue Sep 10, 2016  0439 Potassium infusing.  Patient appears much more comfortable. She now has some ability for neck. Feel there may some component of muscle cramping and spasm related to hypokalemia as well as her known chronic pain. Will reevaluate when  potassium infusion finished.  [CH]    Clinical Course User Index [CH] Sashay Felling, Barbette Hair, MD    Patient presents with  neck pain and stiffness. She has spasms and tenderness at the occiput. She takes chronic pain medication at home and Flexeril with minimal relief. She also reports recent vomiting and diarrhea but has been much improved. Patient was initially given Decadron and Valium. She had very minimal relief. On recheck, she reports continued and persistent pain. Lidoderm patch was applied. She was additionally given a migraine cocktail.  Lab work is notable for hypokalemia. This could be contributing to spasm. Patient was given magnesium and potassium both orally and IV. See clinical course above. Will discharge with a short course of Valium and Lidoderm patches. Continue her additional pain medications at home. Close follow-up for repeat potassium levels. She will be placed on 5 days of supplemental potassium.  After history, exam, and medical workup I feel the patient has been appropriately medically screened and is safe for discharge home. Pertinent diagnoses were discussed with the patient. Patient was given return precautions.   Final Clinical Impressions(s) / ED Diagnoses   Final diagnoses:  Neck pain  Muscle spasms of neck  Hypokalemia    New Prescriptions New Prescriptions   DIAZEPAM (VALIUM) 5 MG TABLET    Take 1 tablet (5 mg total) by mouth every 12 (twelve) hours as needed for muscle spasms.   LIDOCAINE (LIDODERM) 5 %    Place 1 patch onto the skin daily. Remove & Discard patch within 12 hours or as directed by MD   POTASSIUM CHLORIDE SA (K-DUR,KLOR-CON) 20 MEQ TABLET    Take 1 tablet (20 mEq total) by mouth 2 (two) times daily.     Merryl Hacker, MD 09/10/16 [phone]

## 2016-09-10 NOTE — Discharge Instructions (Signed)
You were seen today for neck pain. This is likely a combination of muscle spasms and hypokalemia. You may also have a component of trigeminal neuralgia. Follow-up with your primary physician. You need repeat potassium check.  You also can follow-up with your doctor regarding trigger point injections. If you develop any new or worsening symptoms she should be reevaluated. The symptoms include fever, weakness of the extremities or any new or worsening symptoms.

## 2016-09-10 NOTE — ED Notes (Signed)
Date and time results received: 09/10/16 0212 (use smartphrase ".now" to insert current time)  Test: potassium  Critical Value: 2.7  Name of Provider Notified: dr Dina Rich  Orders Received? Or Actions Taken?: Actions Taken: no orders received.

## 2016-09-10 NOTE — ED Notes (Signed)
Pt c/o shooting pain from neck to right ear when swallowing valium, EDP notified and no orders received.

## 2016-09-11 ENCOUNTER — Telehealth: Payer: Self-pay | Admitting: Internal Medicine

## 2016-09-11 NOTE — Telephone Encounter (Signed)
Pt called to let JL and AB know that she had appointment with a neurologist yesterday and the office notes should be in epic. Then she verified her and her daughter's appointments with [place].

## 2016-09-12 NOTE — Telephone Encounter (Signed)
Noted. Routing to AB.

## 2016-09-12 NOTE — Telephone Encounter (Signed)
Noted. Thanks.

## 2016-09-23 ENCOUNTER — Ambulatory Visit (INDEPENDENT_AMBULATORY_CARE_PROVIDER_SITE_OTHER): Payer: Medicare Other | Admitting: Neurology

## 2016-09-23 ENCOUNTER — Encounter: Payer: Self-pay | Admitting: Neurology

## 2016-09-23 VITALS — BP 138/64 | HR 106 | Ht 69.0 in | Wt 200.0 lb

## 2016-09-23 DIAGNOSIS — R51 Headache: Secondary | ICD-10-CM

## 2016-09-23 DIAGNOSIS — G4486 Cervicogenic headache: Secondary | ICD-10-CM | POA: Insufficient documentation

## 2016-09-23 DIAGNOSIS — F329 Major depressive disorder, single episode, unspecified: Secondary | ICD-10-CM | POA: Diagnosis not present

## 2016-09-23 DIAGNOSIS — F32A Depression, unspecified: Secondary | ICD-10-CM | POA: Insufficient documentation

## 2016-09-23 DIAGNOSIS — G3184 Mild cognitive impairment, so stated: Secondary | ICD-10-CM

## 2016-09-23 MED ORDER — NORTRIPTYLINE HCL 10 MG PO CAPS: 10.0000 mg | ORAL_CAPSULE | Freq: Every day | ORAL | 5 refills | Status: DC
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
## 2016-09-23 NOTE — Patient Instructions (Addendum)
1. Start nortriptyline 2m: Take 1 capsule every night 2. Refer to geriatric psychiatrist Dr. PCasimiro Needle [facility]  Please call [phone] schedule your first appointment.   3. Continue follow-up with Pain Management 4. Schedule follow-up with therapist 5. Follow-up in 4-5 months, call for any changes  Your last three blood pressure readings were: 09/09/16 - 136/78 06/25/16 - 121/73 12/27/15 - 147/72

## 2016-09-24 ENCOUNTER — Telehealth: Payer: Self-pay | Admitting: Neurology

## 2016-09-24 NOTE — Telephone Encounter (Signed)
PT left a message to have a call back from Dr Aquino's nurse to call her, did not say why

## 2016-09-25 ENCOUNTER — Ambulatory Visit (INDEPENDENT_AMBULATORY_CARE_PROVIDER_SITE_OTHER): Payer: Medicare Other | Admitting: Psychiatry

## 2016-09-25 DIAGNOSIS — F329 Major depressive disorder, single episode, unspecified: Secondary | ICD-10-CM | POA: Diagnosis not present

## 2016-09-25 DIAGNOSIS — Z79899 Other long term (current) drug therapy: Secondary | ICD-10-CM | POA: Diagnosis not present

## 2016-09-25 DIAGNOSIS — G4486 Cervicogenic headache: Secondary | ICD-10-CM

## 2016-09-25 DIAGNOSIS — R51 Headache: Secondary | ICD-10-CM

## 2016-09-25 MED ORDER — NORTRIPTYLINE HCL 10 MG PO CAPS: 25.0000 mg | ORAL_CAPSULE | Freq: Every day | ORAL | 5 refills | Status: DC
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
## 2016-11-15 ENCOUNTER — Ambulatory Visit (INDEPENDENT_AMBULATORY_CARE_PROVIDER_SITE_OTHER): Payer: Medicare Other | Admitting: Psychiatry

## 2016-11-15 ENCOUNTER — Encounter (HOSPITAL_COMMUNITY): Payer: Self-pay | Admitting: Psychiatry

## 2016-11-15 VITALS — BP 130/82 | HR 106 | Ht 69.0 in | Wt 206.2 lb

## 2016-11-15 DIAGNOSIS — R51 Headache: Secondary | ICD-10-CM

## 2016-11-15 DIAGNOSIS — Z79899 Other long term (current) drug therapy: Secondary | ICD-10-CM

## 2016-11-15 DIAGNOSIS — F320 Major depressive disorder, single episode, mild: Secondary | ICD-10-CM | POA: Diagnosis not present

## 2016-11-15 DIAGNOSIS — G4486 Cervicogenic headache: Secondary | ICD-10-CM

## 2016-11-15 MED ORDER — TEMAZEPAM 15 MG PO CAPS: 15.0000 mg | ORAL_CAPSULE | Freq: Every evening | ORAL | 3 refills | Status: DC | PRN

## 2016-11-15 MED ORDER — CITALOPRAM HYDROBROMIDE 20 MG PO TABS: ORAL_TABLET | ORAL | 3 refills | Status: DC

## 2016-11-15 MED ORDER — NORTRIPTYLINE HCL 25 MG PO CAPS: ORAL_CAPSULE | ORAL | 4 refills | Status: DC

## 2016-11-15 NOTE — Progress Notes (Signed)
Psychiatric Initial Adult Assessment   Patient Identification: Sheri [name] MRN:  [id number] Date of Evaluation:  11/15/2016 Referral Source: Dr.Karen Delice Lesch Chief Complaint:   Visit Diagnosis:    ICD-10-CM   1. Cervicogenic headache R51 nortriptyline (PAMELOR) 25 MG capsule    History of Present Illness:   Today the patient is seen alone. It turns out her daughter nor her husband come. The patient's mood is improved. Still not at baseline. We reduced her Celexa from inappropriate dose of 80 mg down to 20 mg. We also adjusted on her nortriptyline from 10 mg to dose of 25 mg. In someway she is sleeping better. This is evident by the fact that she no longer takes naps. She still says her sleeping is disturbed in interrupted. It often takes her hour to go back to sleep. But it doesn't seem to produce much daytime dysfunction. Patient still having problems thinking concentrating and remembering. Her energy level is low and she clearly feels worthless. She is not suicidal. She denies the use of alcohol or drugs. She has no psychosis. This patient is diagnosed with mild cognitive impairment and a neurologist implied that there is a component of depression or anxiety which might be worsening her state. The patient unfortunately had a difficult incident where she left her granddaughter in the truck with the air conditioning line and her granddaughter comfortably listening to the radio. The police found this out and she ended up getting it taken has a court hearing about what they consider to be dangerous behavior. I suspect the patient's daughter is upset. Note is the patient is no longer on Valium.  Associated Signs/S doctor. ymptoms: Depression Symptoms:  depressed mood, (Hypo) Manic Symptoms:   Anxiety Symptoms:   Psychotic Symptoms:   PTSD Symptoms:   Past Psychiatric History: Celexa 40 mg   Previous Psychotropic Medications: Yes   Substance Abuse History in the last 12 months:   No.  Consequences of Substance Abuse:   Past Medical History:  Past Medical History:  Diagnosis Date  . Anemia   . Chronic back pain   . Chronic neck pain   . Complication of anesthesia   . Crohn disease ([place])   . Fibromyalgia   . GERD (gastroesophageal reflux disease)   . Hypertension   . Low back pain   . Osteopenia    Last DEXA 12/2010 was normal, on fosamax  . PONV (postoperative nausea and vomiting)     Past Surgical History:  Procedure Laterality Date  . APPENDECTOMY  1990  . CATARACT EXTRACTION W/PHACO Left 01/13/2014   Procedure: CATARACT EXTRACTION PHACO AND INTRAOCULAR LENS PLACEMENT LEFT EYE;  Surgeon: Tonny Branch, MD;  Location: [facility];  Service: Ophthalmology;  Laterality: Left;  CDE:6.90  . CATARACT EXTRACTION W/PHACO Right 01/24/2014   Procedure: CATARACT EXTRACTION PHACO AND INTRAOCULAR LENS PLACEMENT (IOC);  Surgeon: Tonny Branch, MD;  Location: [facility];  Service: Ophthalmology;  Laterality: Right;  CDE:5.91  . CESAREAN SECTION  [id number]  . COLONOSCOPY  09/11/2006  . COLONOSCOPY  10/30/2010   RMR: External and internal hemorrhoids, likely source of hematochezia  otherwise normal rectum/ Left-sided diverticula, status post prior right hemicolectomy with a friable, inflamed, stenotic surgical anastomosis with upstream dilation of the neoterminal ileum/ Crohn's noted, status post biopsy  . COLONOSCOPY N/A 06/23/2014   Dr. Rourk:friable eroded mucosa at the anastomosis with small bowel, consistent with some stenosis Crohn's disease s/p biopsy. Colonic diverticulosis and external hemorrhoids. Pathology with benign anastomotic mucosa, no dysplasia.   Marland Kitchen  drainage of back abscess  2007  . ESOPHAGOGASTRODUODENOSCOPY  04/2009   noncritical schatzki's ring, small hh, sb bx negative  . SMALL INTESTINE SURGERY  [id number]    Family Psychiatric History:   Family History:  Family History  Problem Relation Age of Onset  . Crohn's disease Mother        succumbed to stomach cancer  in her 76s  . Colon cancer Neg Hx     Social History:   Social History   Social History  . Marital status: Legally Separated    Spouse name: N/A  . Number of children: 2  . Years of education: N/A   Occupational History  . disability Not Employed   Social History Main Topics  . Smoking status: Never Smoker  . Smokeless tobacco: Never Used  . Alcohol use No     Comment: has had one drink in the past year  . Drug use: No  . Sexual activity: Yes    Birth control/ protection: None   Other Topics Concern  . None   Social History Narrative  . None    Additional Social History:   Allergies:   Allergies  Allergen Reactions  . Codeine Sulfate     REACTION: nausea    Metabolic Disorder Labs: No results found for: HGBA1C, MPG No results found for: PROLACTIN No results found for: CHOL, TRIG, HDL, CHOLHDL, VLDL, LDLCALC   Current Medications: Current Outpatient Prescriptions  Medication Sig Dispense Refill  . azaTHIOprine (IMURAN) 50 MG tablet TAKE 2 & 1/2 TABLETS DAILY. 75 tablet 11  . azaTHIOprine (IMURAN) 50 MG tablet TAKE 2 & 1/2 TABLETS DAILY. 75 tablet 5  . Calcium Carbonate-Vitamin D (CALCIUM 600+D PO) Take by mouth 2 (two) times daily.      . Cyanocobalamin (VITAMIN B-12 IJ) Inject as directed every 14 (fourteen) days.     . cyclobenzaprine (FLEXERIL) 5 MG tablet Take 5 mg by mouth 3 (three) times daily.    . diazepam (VALIUM) 5 MG tablet Take 1 tablet (5 mg total) by mouth every 12 (twelve) hours as needed for muscle spasms. 10 tablet 0  . fish oil-omega-3 fatty acids 1000 MG capsule Take 2 g by mouth daily.      Marland Kitchen lidocaine (LIDODERM) 5 % Place 1 patch onto the skin daily. Remove & Discard patch within 12 hours or as directed by MD 10 patch 0  . lisinopril-hydrochlorothiazide (PRINZIDE,ZESTORETIC) 20-12.5 MG per tablet Take 1 tablet by mouth daily. 30 tablet 0  . Loratadine 10 MG CAPS Take 10 mg by mouth daily.     . multivitamin (THERAGRAN) per tablet Take 1  tablet by mouth daily.      . nortriptyline (PAMELOR) 25 MG capsule 2  qhs 60 capsule 4  . omeprazole (PRILOSEC) 20 MG capsule Take 20 mg by mouth daily.      . ondansetron (ZOFRAN) 4 MG tablet Take 4 mg by mouth. ONE BY MOUTH EVERY 4-6 HOURS AS NEEDED FOR NAUSEA AND VOMITING     . oxyCODONE (ROXICODONE) 15 MG immediate release tablet Take 15 mg by mouth every 4 (four) hours as needed.     Marland Kitchen PENTASA 500 MG CR capsule TAKE (2) CAPSULES FOUR TIMES DAILY. 240 capsule 5  . vitamin E 1000 UNIT capsule Take 1,000 Units by mouth daily.      Marland Kitchen zinc gluconate 50 MG tablet Take 50 mg by mouth daily.      . citalopram (CELEXA) 20 MG tablet 1  qam 30 tablet 3  . pregabalin (LYRICA) 50 MG capsule Take 1 capsule (50 mg total) by mouth 2 (two) times daily. (Patient not taking: Reported on 09/23/2016) 30 capsule 0  . temazepam (RESTORIL) 15 MG capsule Take 1 capsule (15 mg total) by mouth at bedtime as needed for sleep. 30 capsule 3   No current facility-administered medications for this visit.     Neurologic: Headache:  Seizure: No Paresthesias:No  Musculoskeletal: Strength & Muscle Tone: within normal limits Gait & Station: normal Patient leans: Right  Psychiatric Specialty Exam: ROS  Blood pressure 130/82, pulse (!) 106, height 5' 9"  (1.753 m), weight 206 lb 3.2 oz (93.5 kg).Body mass index is 30.45 kg/m.  General Appearance: Casual  Eye Contact:  Fair  Speech:  Negative  Volume:  Normal  Mood:  NA  Affect:  Congruent  Thought Process:  Coherent  Orientation:  NA  Thought Content:  WDL  Suicidal Thoughts:  No  Homicidal Thoughts:  No  Memory:  Negative  Judgement:  Good  Insight:  Present  Psychomotor Activity:  Normal  Concentration:    Recall:    Fund of Knowledge:Good  Language: Fair  Akathisia:  No  Handed:  Right  AIMS (if indicated):    Assets:    ADL's:    Cognition:   nt Plan Summary: 10/5/201812:01 PM   At this time the patient shows some improvement. At this time  we'll go ahead and increase her nortriptyline to her more appropriate dose of 50 mg. If she's not better will get a nortriptyline blood level. She'll continue taking Celexa at 20 mg. Today we'll add Restoril 15 mg for sleep the patient does see a therapist in [place] at her local community clinic. She hasn't seen her in a while but she says she'll go ahead and make an appointment to see her again. Patient says she's getting along reasonably well with her husband. There some difficulty communicating. It seems her husband refuses to come with her to see me. The patient returns in 2 months she will bring her daughter will ask her questions about the patient's concentration and mood state. This patient is not suicidal. She denies chest pain or shortness of breath.

## 2016-12-31 ENCOUNTER — Telehealth: Payer: Self-pay | Admitting: *Deleted

## 2016-12-31 ENCOUNTER — Ambulatory Visit (INDEPENDENT_AMBULATORY_CARE_PROVIDER_SITE_OTHER): Payer: Medicare Other | Admitting: Gastroenterology

## 2016-12-31 ENCOUNTER — Encounter: Payer: Self-pay | Admitting: Gastroenterology

## 2016-12-31 ENCOUNTER — Encounter: Payer: Self-pay | Admitting: *Deleted

## 2016-12-31 ENCOUNTER — Other Ambulatory Visit: Payer: Self-pay | Admitting: *Deleted

## 2016-12-31 VITALS — BP 146/78 | HR 103 | Temp 97.5°F | Ht 70.0 in | Wt 205.2 lb

## 2016-12-31 DIAGNOSIS — K5080 Crohn's disease of both small and large intestine without complications: Secondary | ICD-10-CM

## 2016-12-31 DIAGNOSIS — R1310 Dysphagia, unspecified: Secondary | ICD-10-CM | POA: Diagnosis not present

## 2016-12-31 DIAGNOSIS — R1013 Epigastric pain: Secondary | ICD-10-CM | POA: Diagnosis not present

## 2016-12-31 NOTE — Assessment & Plan Note (Signed)
66 year old female with new onset dyspepsia including nausea, belching, on Prilosec once daily. Endorses solid food and pill dysphagia as well, new onset. Last EGD in 2011 with non-critical Schatzki's ring. History of chronic opioid use that is now being tapered down, and she is quite tearful today. She remains in close follow-up with psychiatry and is without alarm symptoms today.   1. Stop Prilosec. Trial of Dexilant. Samples provided. 2. Proceed with upper endoscopy/dilatation in the near future with Dr. Gala Romney. The risks, benefits, and alternatives have been discussed in detail with patient. They have stated understanding and desire to proceed. PROPOFOL due to polypharmacy 3. If EGD completely negative and bloating, belching continues, could consider GES  4. Follow-up in January

## 2016-12-31 NOTE — Assessment & Plan Note (Signed)
Continues on Imuran and Pentasa. Clinically doing well. Return in Jan 2019 for close follow-up. Will need to retrieve most recent DEXA scan at that time.

## 2016-12-31 NOTE — Progress Notes (Signed)
Referring Provider: Monico Blitz, MD Primary Care Physician:  Monico Blitz, MD Primary GI: Dr. Gala Romney   Chief Complaint  Patient presents with  . belching    tastes like rotten eggs  . Crohn's Disease    HPI:   Sheri [name] is a 66 y.o. female presenting today with a history of  ileocolonic Crohn's disease on Imuran and Pentasa. Interesting symptoms of perirectal discomfort in past, with CTE and subsequent MRI raising question of possible fistula in the past but no current inflammatory issues or active fistula/abscess. Due to  rectal bleeding in May 2016, underwent updated colonoscopy. Noted friable eroded mucosa at the anastomosis with small bowel, consistent with some stenosis Crohn's disease s/p biopsy. Colonic diverticulosis and external hemorrhoids. Pathology with benign anastomotic mucosa, no dysplasia. Discussion of Humira briefly raised during last appt prior to colonoscopy, but due to benign pathology, decision was made to continue Imuran and Pentasa.  In May, it was noted that she had an elevated MCV and MCH. We had recommended serum B12, TSH, and folate. This has not been completed. She was seen by a psychologist in July 2018 with amnestic mild cognitive impairment with significant depression and anxiety. Felt that factors such as opioid medications and depression/anxiety were playing a role. 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   States while getting tapered off of narcotics, she was having more BMs. For past 2 months has had a heavy sensation in chest with burping and foul-smelling egg-like characteristics. Had cycle of N/V/D for 24 hours. Having intermittent nausea. Has a lot of burping. Sometimes burping with nausea, sometimes burping with nausea and diarrhea. Prilosec once daily. Doesn't remember taking Protonix. Notes intermittent solid food dysphagia, liquids, pills. No NSAIDs. Only tylenol products. Taking a lot of Tums lately. Tearful throughout visit. Feels  depressed but attributes this to weaning off of pain medications. She denies any homicidal or suicidal ideation. She has a good support system at home.    Past Medical History:  Diagnosis Date  . Anemia   . Chronic back pain   . Chronic neck pain   . Complication of anesthesia   . Crohn disease ([place])   . Fibromyalgia   . GERD (gastroesophageal reflux disease)   . Hypertension   . Low back pain   . Osteopenia    Last DEXA 12/2010 was normal, on fosamax  . PONV (postoperative nausea and vomiting)     Past Surgical History:  Procedure Laterality Date  . APPENDECTOMY  1990  . CATARACT EXTRACTION W/PHACO Left 01/13/2014   Procedure: CATARACT EXTRACTION PHACO AND INTRAOCULAR LENS PLACEMENT LEFT EYE;  Surgeon: Tonny Branch, MD;  Location: [facility];  Service: Ophthalmology;  Laterality: Left;  CDE:6.90  . CATARACT EXTRACTION W/PHACO Right 01/24/2014   Procedure: CATARACT EXTRACTION PHACO AND INTRAOCULAR LENS PLACEMENT (IOC);  Surgeon: Tonny Branch, MD;  Location: [facility];  Service: Ophthalmology;  Laterality: Right;  CDE:5.91  . CESAREAN SECTION  [id number]  . COLONOSCOPY  09/11/2006  . COLONOSCOPY  10/30/2010   RMR: External and internal hemorrhoids, likely source of hematochezia  otherwise normal rectum/ Left-sided diverticula, status post prior right hemicolectomy with a friable, inflamed, stenotic surgical anastomosis with upstream dilation of the neoterminal ileum/ Crohn's noted, status post biopsy  . COLONOSCOPY N/A 06/23/2014   Dr. Rourk:friable eroded mucosa at the anastomosis with small bowel, consistent with some stenosis Crohn's disease s/p biopsy. Colonic diverticulosis and external hemorrhoids. Pathology with benign anastomotic mucosa, no dysplasia.   Marland Kitchen drainage of  back abscess  2007  . ESOPHAGOGASTRODUODENOSCOPY  04/2009   noncritical schatzki's ring, small hh, sb bx negative  . SMALL INTESTINE SURGERY  [id number]    Current Outpatient Medications  Medication Sig Dispense Refill  .  azaTHIOprine (IMURAN) 50 MG tablet TAKE 2 & 1/2 TABLETS DAILY. 75 tablet 11  . Calcium Carbonate-Vitamin D (CALCIUM 600+D PO) Take by mouth 2 (two) times daily.      . citalopram (CELEXA) 20 MG tablet 1  qam 30 tablet 3  . Cyanocobalamin (VITAMIN B-12 IJ) Inject as directed every 14 (fourteen) days.     . cyclobenzaprine (FLEXERIL) 5 MG tablet Take 5 mg by mouth 3 (three) times daily.    . fish oil-omega-3 fatty acids 1000 MG capsule Take 2 g by mouth daily.      Marland Kitchen lisinopril-hydrochlorothiazide (PRINZIDE,ZESTORETIC) 20-12.5 MG per tablet Take 1 tablet by mouth daily. 30 tablet 0  . Loratadine 10 MG CAPS Take 10 mg by mouth daily.     . multivitamin (THERAGRAN) per tablet Take 1 tablet by mouth daily.      . nortriptyline (PAMELOR) 25 MG capsule 2  qhs 60 capsule 4  . omeprazole (PRILOSEC) 20 MG capsule Take 20 mg by mouth daily.      . ondansetron (ZOFRAN) 4 MG tablet Take 4 mg by mouth. ONE BY MOUTH EVERY 4-6 HOURS AS NEEDED FOR NAUSEA AND VOMITING     . oxyCODONE (ROXICODONE) 15 MG immediate release tablet Take 15 mg by mouth every 6 (six) hours as needed.     Marland Kitchen PENTASA 500 MG CR capsule TAKE (2) CAPSULES FOUR TIMES DAILY. 240 capsule 5  . temazepam (RESTORIL) 15 MG capsule Take 1 capsule (15 mg total) by mouth at bedtime as needed for sleep. 30 capsule 3  . vitamin E 1000 UNIT capsule Take 1,000 Units by mouth daily.      Marland Kitchen XTAMPZA ER 13.5 MG C12A Take 1 capsule by mouth daily.    Marland Kitchen zinc gluconate 50 MG tablet Take 50 mg by mouth daily.       No current facility-administered medications for this visit.     Allergies as of 12/31/2016 - Review Complete 12/31/2016  Allergen Reaction Noted  . Codeine sulfate      Family History  Problem Relation Age of Onset  . Crohn's disease Mother        succumbed to stomach cancer in her 87s  . Colon cancer Neg Hx     Social History   Socioeconomic History  . Marital status: Legally Separated    Spouse name: None  . Number of children: 2    . Years of education: None  . Highest education level: None  Social Needs  . Financial resource strain: None  . Food insecurity - worry: None  . Food insecurity - inability: None  . Transportation needs - medical: None  . Transportation needs - non-medical: None  Occupational History  . Occupation: disability    Employer: NOT EMPLOYED  Tobacco Use  . Smoking status: Never Smoker  . Smokeless tobacco: Never Used  Substance and Sexual Activity  . Alcohol use: No  . Drug use: No  . Sexual activity: Yes    Birth control/protection: None  Other Topics Concern  . None  Social History Narrative  . None    Review of Systems: Gen: Denies fever, chills, anorexia. Denies fatigue, weakness, weight loss.  CV: Denies chest pain, palpitations, syncope, peripheral edema, and claudication. Resp: Denies dyspnea  at rest, cough, wheezing, coughing up blood, and pleurisy. GI: see HPI  Derm: Denies rash, itching, dry skin Psych: see HPI  Heme: Denies bruising, bleeding, and enlarged lymph nodes.  Physical Exam: BP (!) 146/78   Pulse (!) 103   Temp (!) 97.5 F (36.4 C) (Oral)   Ht 5' 10"  (1.778 m)   Wt 205 lb 3.2 oz (93.1 kg)   BMI 29.44 kg/m  General:   Alert and oriented. Tearful but remains pleasant and cooperative Head:  Normocephalic and atraumatic. Eyes:  Conjuctiva clear without scleral icterus. Mouth:  Oral mucosa pink and moist.  Lungs: clear to auscultation bilaterally Cardiac: S1 S2 present without murmurs  Abdomen:  +BS, soft, non-tender and non-distended. No rebound or guarding. No HSM or masses noted. Msk:  Symmetrical without gross deformities. Normal posture. Extremities:  Without edema. Neurologic:  Alert and  oriented x4 Psych:  Alert and cooperative. Normal mood and affect.

## 2016-12-31 NOTE — Telephone Encounter (Signed)
Pre-op appt scheduled for 01/30/17 at 11:00am. Letter mailed. Left detailed message on VM.

## 2016-12-31 NOTE — Patient Instructions (Signed)
We have scheduled you for an upper endoscopy and dilatation with Dr. Gala Romney in the near future.  Stop Prilosec for now. Start taking the samples called Dexilant once daily. Let me know how this works for you, and I can send in a prescription.  I will see you back in January!  I hope you have a wonderful holiday season with your family.

## 2016-12-31 NOTE — Progress Notes (Signed)
cc'ed to pcp °

## 2016-12-31 NOTE — Assessment & Plan Note (Signed)
Dilatation as appropriate.

## 2017-01-09 ENCOUNTER — Other Ambulatory Visit: Payer: Self-pay | Admitting: Gastroenterology

## 2017-01-10 ENCOUNTER — Telehealth: Payer: Self-pay | Admitting: Internal Medicine

## 2017-01-10 DIAGNOSIS — K219 Gastro-esophageal reflux disease without esophagitis: Secondary | ICD-10-CM

## 2017-01-10 NOTE — Telephone Encounter (Signed)
Pt would like an rx of Dexilant called in to her pharmacy. pts last ov was 12/31/16.

## 2017-01-10 NOTE — Telephone Encounter (Signed)
[phone] patient called and stated the dexilant samples were working good and would like a prescription sent to [place] [facility] [place]

## 2017-01-15 MED ORDER — DEXLANSOPRAZOLE 60 MG PO CPDR: 60.0000 mg | DELAYED_RELEASE_CAPSULE | Freq: Every day | ORAL | 1 refills | Status: DC

## 2017-01-15 NOTE — Telephone Encounter (Signed)
Pt called office. Dexilant rx hasn't been sent to her pharmacy. Sending to refill box.

## 2017-01-15 NOTE — Addendum Note (Signed)
Addended by: Gordy Levan, Braylon Lemmons A on: 01/15/2017 04:44 PM   Modules accepted: Orders

## 2017-01-15 NOTE — Telephone Encounter (Signed)
Called and informed pt.  

## 2017-01-15 NOTE — Telephone Encounter (Signed)
Please tell the patient the Rx was sent to the pharmacy.

## 2017-01-22 ENCOUNTER — Ambulatory Visit (INDEPENDENT_AMBULATORY_CARE_PROVIDER_SITE_OTHER): Payer: Medicare Other | Admitting: Psychiatry

## 2017-01-22 DIAGNOSIS — F339 Major depressive disorder, recurrent, unspecified: Secondary | ICD-10-CM

## 2017-01-22 DIAGNOSIS — F325 Major depressive disorder, single episode, in full remission: Secondary | ICD-10-CM

## 2017-01-22 MED ORDER — GABAPENTIN 300 MG PO CAPS: ORAL_CAPSULE | ORAL | 3 refills | Status: DC

## 2017-01-22 MED ORDER — TEMAZEPAM 15 MG PO CAPS: ORAL_CAPSULE | ORAL | 3 refills | Status: DC

## 2017-01-22 NOTE — Progress Notes (Signed)
Psychiatric Initial Adult Assessment   Patient Identification: Sheri [name] MRN:  [id number] Date of Evaluation:  01/22/2017 Referral Source: Dr.Karen Delice Lesch Chief Complaint:   Visit Diagnosis:    ICD-10-CM   1. 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 The patient is not much better. The biggest issue is the patient's pain doctor is slowly reduce her opiates. The patient feels very depressed and sad and seems to be in a lot of pain. This is per her report. 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 Medicine Restoril has helped very minimally. She is on 50 mg of nortriptyline. She still takes Celexa 20 mg. The patient is eating well. Her energy level is reduced. She can still think and concentrate. She's not really worthless. She denies. She spends much of her time taking care of her grandchildren he does listen TV and music. Patient has multiple medical illnesses including Crohn's, arthritis fibromyalgia. She says the opiates help all of these things and she feels very distressed that her opiates are being reduced. The patient is not psychotic.the patient drinks no alcohol uses no drugs. She is taking excessive amounts of opiates that shecan run out early. She really has got trouble for it yet. I encouraged the patient to discuss these issues with her pain doctor. The patient remembers being on the right this past does remember working. She does not remember the dose.  Associated Signs/S doctor. ymptoms: Depression Symptoms:  depressed mood, (Hypo) Manic Symptoms:   Anxiety Symptoms:   Psychotic Symptoms:   PTSD Symptoms:   Past Psychiatric History: Celexa 40 mg   Previous Psychotropic Medications: Yes   Substance Abuse History in the last 12 months:  No.  Consequences of Substance Abuse:   Past Medical History:  Past Medical History:  Diagnosis Date  . Anemia   .  Chronic back pain   . Chronic neck pain   . Complication of anesthesia   . Crohn disease ([place])   . Fibromyalgia   . GERD (gastroesophageal reflux disease)   . Hypertension   . Low back pain   . Osteopenia    Last DEXA 12/2010 was normal, on fosamax  . PONV (postoperative nausea and vomiting)     Past Surgical History:  Procedure Laterality Date  . APPENDECTOMY  1990  . CATARACT EXTRACTION W/PHACO Left 01/13/2014   Procedure: CATARACT EXTRACTION PHACO AND INTRAOCULAR LENS PLACEMENT LEFT EYE;  Surgeon: Tonny Branch, MD;  Location: [facility];  Service: Ophthalmology;  Laterality: Left;  CDE:6.90  . CATARACT EXTRACTION W/PHACO Right 01/24/2014   Procedure: CATARACT EXTRACTION PHACO AND INTRAOCULAR LENS PLACEMENT (IOC);  Surgeon: Tonny Branch, MD;  Location: [facility];  Service: Ophthalmology;  Laterality: Right;  CDE:5.91  . CESAREAN SECTION  [id number]  . COLONOSCOPY  09/11/2006  . COLONOSCOPY  10/30/2010   RMR: External and internal hemorrhoids, likely source of hematochezia  otherwise normal rectum/ Left-sided diverticula, status post prior right hemicolectomy with a friable, inflamed, stenotic surgical anastomosis with upstream dilation of the neoterminal ileum/ Crohn's noted, status post biopsy  . COLONOSCOPY N/A 06/23/2014   Dr. Rourk:friable eroded mucosa at the anastomosis with small bowel, consistent with some stenosis Crohn's disease s/p biopsy. Colonic diverticulosis and external hemorrhoids. Pathology with benign anastomotic mucosa, no dysplasia.   Marland Kitchen drainage of back abscess  2007  . ESOPHAGOGASTRODUODENOSCOPY  04/2009   noncritical schatzki's ring, small hh, sb bx  negative  . SMALL INTESTINE SURGERY  [id number]    Family Psychiatric History:   Family History:  Family History  Problem Relation Age of Onset  . Crohn's disease Mother        succumbed to stomach cancer in her 66s  . Colon cancer Neg Hx     Social History:   Social History   Socioeconomic History  . Marital status:  Legally Separated    Spouse name: Not on file  . Number of children: 2  . Years of education: Not on file  . Highest education level: Not on file  Social Needs  . Financial resource strain: Not on file  . Food insecurity - worry: Not on file  . Food insecurity - inability: Not on file  . Transportation needs - medical: Not on file  . Transportation needs - non-medical: Not on file  Occupational History  . Occupation: disability    Employer: NOT EMPLOYED  Tobacco Use  . Smoking status: Never Smoker  . Smokeless tobacco: Never Used  Substance and Sexual Activity  . Alcohol use: No  . Drug use: No  . Sexual activity: Yes    Birth control/protection: None  Other Topics Concern  . Not on file  Social History Narrative  . Not on file    Additional Social History:   Allergies:   Allergies  Allergen Reactions  . Codeine Sulfate     REACTION: nausea    Metabolic Disorder Labs: No results found for: HGBA1C, MPG No results found for: PROLACTIN No results found for: CHOL, TRIG, HDL, CHOLHDL, VLDL, LDLCALC   Current Medications: Current Outpatient Medications  Medication Sig Dispense Refill  . azaTHIOprine (IMURAN) 50 MG tablet TAKE 2 & 1/2 TABLETS DAILY. 75 tablet 11  . Calcium Carbonate-Vitamin D (CALCIUM 600+D PO) Take by mouth 2 (two) times daily.      . citalopram (CELEXA) 20 MG tablet 1  qam 30 tablet 3  . Cyanocobalamin (VITAMIN B-12 IJ) Inject as directed every 14 (fourteen) days.     . cyclobenzaprine (FLEXERIL) 5 MG tablet Take 5 mg by mouth 3 (three) times daily.    Marland Kitchen dexlansoprazole (DEXILANT) 60 MG capsule Take 1 capsule (60 mg total) by mouth daily. 90 capsule 1  . fish oil-omega-3 fatty acids 1000 MG capsule Take 2 g by mouth daily.      Marland Kitchen gabapentin (NEURONTIN) 300 MG capsule 1 bid for 4 days 1  qam  2  qhs  For 1 week then 1 qam 3  qhs 120 capsule 3  . lisinopril-hydrochlorothiazide (PRINZIDE,ZESTORETIC) 20-12.5 MG per tablet Take 1 tablet by mouth daily. 30  tablet 0  . Loratadine 10 MG CAPS Take 10 mg by mouth daily.     . multivitamin (THERAGRAN) per tablet Take 1 tablet by mouth daily.      . nortriptyline (PAMELOR) 25 MG capsule 2  qhs 60 capsule 4  . omeprazole (PRILOSEC) 20 MG capsule Take 20 mg by mouth daily.      . ondansetron (ZOFRAN) 4 MG tablet Take 4 mg by mouth. ONE BY MOUTH EVERY 4-6 HOURS AS NEEDED FOR NAUSEA AND VOMITING     . oxyCODONE (ROXICODONE) 15 MG immediate release tablet Take 15 mg by mouth every 6 (six) hours as needed.     Marland Kitchen PENTASA 500 MG CR capsule TAKE (2) CAPSULES FOUR TIMES DAILY. 240 capsule 5  . temazepam (RESTORIL) 15 MG capsule 2  qhs 60 capsule 3  . vitamin E  1000 UNIT capsule Take 1,000 Units by mouth daily.      Marland Kitchen XTAMPZA ER 13.5 MG C12A Take 1 capsule by mouth daily.    Marland Kitchen zinc gluconate 50 MG tablet Take 50 mg by mouth daily.       No current facility-administered medications for this visit.     Neurologic: Headache:  Seizure: No Paresthesias:No  Musculoskeletal: Strength & Muscle Tone: within normal limits Gait & Station: normal Patient leans: Right  Psychiatric Specialty Exam: ROS  There were no vitals taken for this visit.There is no height or weight on file to calculate BMI.  General Appearance: Casual  Eye Contact:  Fair  Speech:  Negative  Volume:  Normal  Mood:  NA  Affect:  Congruent  Thought Process:  Coherent  Orientation:  NA  Thought Content:  WDL  Suicidal Thoughts:  No  Homicidal Thoughts:  No  Memory:  Negative  Judgement:  Good  Insight:  Present  Psychomotor Activity:  Normal  Concentration:    Recall:    Fund of Knowledge:Good  Language: Fair  Akathisia:  No  Handed:  Right  AIMS (if indicated):    Assets:    ADL's:    Cognition:   nt Plan Summary: 12/12/20185:08 PM   At this time we will go ahead and double her Restoril to taking 30 mg. Today we'll begin her on Neurontin 300 mg 1 twice a day for a few days then 1 in the morning and 2 at night and after  week 1 in the morning 2 at night. Is my hope that this will reduce her pain but really using it for her complaints of anxiety for the fact that she's not sleeping well at night. Hopefully this will calm. Patient continue taking Celexa 20 mg. Patient get a nortriptyline blood level the next few days. We'll consider possibly increasing her nortriptyline. At this time she takes 50 mg and her level is low will increase it. The patient is difficult expressing herself. Her daughter is her advocate.. This patient to return to see me in 7 weeks.

## 2017-01-23 ENCOUNTER — Ambulatory Visit (HOSPITAL_COMMUNITY): Payer: Self-pay | Admitting: Psychiatry

## 2017-01-23 ENCOUNTER — Other Ambulatory Visit: Payer: Self-pay | Admitting: Nurse Practitioner

## 2017-01-29 NOTE — Patient Instructions (Signed)
Sheri [name]  01/29/2017     @PREFPERIOPPHARMACY @   Your procedure is scheduled on 02/06/17.  Report to [facility] at 08:30 A.M.  Call this number if you have problems the morning of surgery:  [phone]   Remember:  Do not eat food or drink liquids after midnight.  Take these medicines the morning of surgery with A SIP OF WATER: Tylenol, Imuran, Celexa, Flexeril, Dexilant, Loratadine, Oxycontin, Roxicodone, Gabapentin   Do not wear jewelry, make-up or nail polish.  Do not wear lotions, powders, or perfumes, or deodorant.  Do not shave 48 hours prior to surgery.  Men may shave face and neck.  Do not bring valuables to the hospital.  [facility] is not responsible for any belongings or valuables.  Contacts, dentures or bridgework may not be worn into surgery.  Leave your suitcase in the car.  After surgery it may be brought to your room.  For patients admitted to the hospital, discharge time will be determined by your treatment team.  Patients discharged the day of surgery will not be allowed to drive home.   Name and phone number of your driver:   Special instructions:   Please read over the following fact sheets that you were given. Anesthesia Post-op Instructions    Esophagogastroduodenoscopy Esophagogastroduodenoscopy (EGD) is a procedure to examine the lining of the esophagus, stomach, and first part of the small intestine (duodenum). This procedure is done to check for problems such as inflammation, bleeding, ulcers, or growths. During this procedure, a long, flexible, lighted tube with a camera attached (endoscope) is inserted down the throat. Tell a health care provider about:  Any allergies you have.  All medicines you are taking, including vitamins, herbs, eye drops, creams, and over-the-counter medicines.  Any problems you or family members have had with anesthetic medicines.  Any blood disorders you have.  Any surgeries you have had.  Any medical  conditions you have.  Whether you are pregnant or may be pregnant. What are the risks? Generally, this is a safe procedure. However, problems may occur, including:  Infection.  Bleeding.  A tear (perforation) in the esophagus, stomach, or duodenum.  Trouble breathing.  Excessive sweating.  Spasms of the larynx.  A slowed heartbeat.  Low blood pressure.  What happens before the procedure?  Follow instructions from your health care provider about eating or drinking restrictions.  Ask your health care provider about: ? Changing or stopping your regular medicines. This is especially important if you are taking diabetes medicines or blood thinners. ? Taking medicines such as aspirin and ibuprofen. These medicines can thin your blood. Do not take these medicines before your procedure if your health care provider instructs you not to.  Plan to have someone take you home after the procedure.  If you wear dentures, be ready to remove them before the procedure. What happens during the procedure?  To reduce your risk of infection, your health care team will wash or sanitize their hands.  An IV tube will be put in a vein in your hand or arm. You will get medicines and fluids through this tube.  You will be given one or more of the following: ? A medicine to help you relax (sedative). ? A medicine to numb the area (local anesthetic). This medicine may be sprayed into your throat. It will make you feel more comfortable and keep you from gagging or coughing during the procedure. ? A medicine for pain.  A mouth guard  may be placed in your mouth to protect your teeth and to keep you from biting on the endoscope.  You will be asked to lie on your left side.  The endoscope will be lowered down your throat into your esophagus, stomach, and duodenum.  Air will be put into the endoscope. This will help your health care provider see better.  The lining of your esophagus, stomach, and  duodenum will be examined.  Your health care provider may: ? Take a tissue sample so it can be looked at in a lab (biopsy). ? Remove growths. ? Remove objects (foreign bodies) that are stuck. ? Treat any bleeding with medicines or other devices that stop tissue from bleeding. ? Widen (dilate) or stretch narrowed areas of your esophagus and stomach.  The endoscope will be taken out. The procedure may vary among health care providers and hospitals. What happens after the procedure?  Your blood pressure, heart rate, breathing rate, and blood oxygen level will be monitored often until the medicines you were given have worn off.  Do not eat or drink anything until the numbing medicine has worn off and your gag reflex has returned. This information is not intended to replace advice given to you by your health care provider. Make sure you discuss any questions you have with your health care provider. Document Released: 05/31/2004 Document Revised: 07/06/2015 Document Reviewed: 12/22/2014 Elsevier Interactive Patient Education  2018 [employer].     Esophageal Dilatation Esophageal dilatation is a procedure to open a blocked or narrowed part of the esophagus. The esophagus is the long tube in your throat that carries food and liquid from your mouth to your stomach. The procedure is also called esophageal dilation. You may need this procedure if you have a buildup of scar tissue in your esophagus that makes it difficult, painful, or even impossible to swallow. This can be caused by gastroesophageal reflux disease (GERD). In rare cases, people need this procedure because they have cancer of the esophagus or a problem with the way food moves through the esophagus. Sometimes you may need to have another dilatation to enlarge the opening of the esophagus gradually. Tell a health care provider about:  Any allergies you have.  All medicines you are taking, including vitamins, herbs, eye drops,  creams, and over-the-counter medicines.  Any problems you or family members have had with anesthetic medicines.  Any blood disorders you have.  Any surgeries you have had.  Any medical conditions you have.  Any antibiotic medicines you are required to take before dental procedures. What are the risks? Generally, this is a safe procedure. However, problems can occur and include:  Bleeding from a tear in the lining of the esophagus.  A hole (perforation) in the esophagus.  What happens before the procedure?  Do not eat or drink anything after midnight on the night before the procedure or as directed by your health care provider.  Ask your health care provider about changing or stopping your regular medicines. This is especially important if you are taking diabetes medicines or blood thinners.  Plan to have someone take you home after the procedure. What happens during the procedure?  You will be given a medicine that makes you relaxed and sleepy (sedative).  A medicine may be sprayed or gargled to numb the back of the throat.  Your health care provider can use various instruments to do an esophageal dilatation. During the procedure, the instrument used will be placed in your mouth and  passed down into your esophagus. Options include: ? Simple dilators. This instrument is carefully placed in the esophagus to stretch it. ? Guided wire bougies. In this method, a flexible tube (endoscope) is used to insert a wire into the esophagus. The dilator is passed over this wire to enlarge the esophagus. Then the wire is removed. ? Balloon dilators. An endoscope with a small balloon at the end is passed down into the esophagus. Inflating the balloon gently stretches the esophagus and opens it up. What happens after the procedure?  Your blood pressure, heart rate, breathing rate, and blood oxygen level will be monitored often until the medicines you were given have worn off.  Your throat may  feel slightly sore and will probably still feel numb. This will improve slowly over time.  You will not be allowed to eat or drink until the throat numbness has resolved.  If this is a same-day procedure, you may be allowed to go home once you have been able to drink, urinate, and sit on the edge of the bed without nausea or dizziness.  If this is a same-day procedure, you should have a friend or family member with you for the next 24 hours after the procedure. This information is not intended to replace advice given to you by your health care provider. Make sure you discuss any questions you have with your health care provider. Document Released: 03/21/2005 Document Revised: 07/06/2015 Document Reviewed: 06/09/2013 Elsevier Interactive Patient Education  2018 [place] Anesthesia is a term that refers to techniques, procedures, and medicines that help a person stay safe and comfortable during a medical procedure. Monitored anesthesia care, or sedation, is one type of anesthesia. Your anesthesia specialist may recommend sedation if you will be having a procedure that does not require you to be unconscious, such as:  Cataract surgery.  A dental procedure.  A biopsy.  A colonoscopy.  During the procedure, you may receive a medicine to help you relax (sedative). There are three levels of sedation:  Mild sedation. At this level, you may feel awake and relaxed. You will be able to follow directions.  Moderate sedation. At this level, you will be sleepy. You may not remember the procedure.  Deep sedation. At this level, you will be asleep. You will not remember the procedure.  The more medicine you are given, the deeper your level of sedation will be. Depending on how you respond to the procedure, the anesthesia specialist may change your level of sedation or the type of anesthesia to fit your needs. An anesthesia specialist will monitor you closely during  the procedure. Let your health care provider know about:  Any allergies you have.  All medicines you are taking, including vitamins, herbs, eye drops, creams, and over-the-counter medicines.  Any use of steroids (by mouth or as a cream).  Any problems you or family members have had with sedatives and anesthetic medicines.  Any blood disorders you have.  Any surgeries you have had.  Any medical conditions you have, such as sleep apnea.  Whether you are pregnant or may be pregnant.  Any use of cigarettes, alcohol, or street drugs. What are the risks? Generally, this is a safe procedure. However, problems may occur, including:  Getting too much medicine (oversedation).  Nausea.  Allergic reaction to medicines.  Trouble breathing. If this happens, a breathing tube may be used to help with breathing. It will be removed when you are awake and breathing  on your own.  Heart trouble.  Lung trouble.  Before the procedure Staying hydrated Follow instructions from your health care provider about hydration, which may include:  Up to 2 hours before the procedure - you may continue to drink clear liquids, such as water, clear fruit juice, black coffee, and plain tea.  Eating and drinking restrictions Follow instructions from your health care provider about eating and drinking, which may include:  8 hours before the procedure - stop eating heavy meals or foods such as meat, fried foods, or fatty foods.  6 hours before the procedure - stop eating light meals or foods, such as toast or cereal.  6 hours before the procedure - stop drinking milk or drinks that contain milk.  2 hours before the procedure - stop drinking clear liquids.  Medicines Ask your health care provider about:  Changing or stopping your regular medicines. This is especially important if you are taking diabetes medicines or blood thinners.  Taking medicines such as aspirin and ibuprofen. These medicines can  thin your blood. Do not take these medicines before your procedure if your health care provider instructs you not to.  Tests and exams  You will have a physical exam.  You may have blood tests done to show: ? How well your kidneys and liver are working. ? How well your blood can clot.  General instructions  Plan to have someone take you home from the hospital or clinic.  If you will be going home right after the procedure, plan to have someone with you for 24 hours.  What happens during the procedure?  Your blood pressure, heart rate, breathing, level of pain and overall condition will be monitored.  An IV tube will be inserted into one of your veins.  Your anesthesia specialist will give you medicines as needed to keep you comfortable during the procedure. This may mean changing the level of sedation.  The procedure will be performed. After the procedure  Your blood pressure, heart rate, breathing rate, and blood oxygen level will be monitored until the medicines you were given have worn off.  Do not drive for 24 hours if you received a sedative.  You may: ? Feel sleepy, clumsy, or nauseous. ? Feel forgetful about what happened after the procedure. ? Have a sore throat if you had a breathing tube during the procedure. ? Vomit. This information is not intended to replace advice given to you by your health care provider. Make sure you discuss any questions you have with your health care provider. Document Released: 10/24/2004 Document Revised: 07/07/2015 Document Reviewed: 05/21/2015 Elsevier Interactive Patient Education  [employer].

## 2017-01-30 ENCOUNTER — Other Ambulatory Visit (HOSPITAL_COMMUNITY): Payer: Self-pay | Admitting: Nurse Practitioner

## 2017-01-30 ENCOUNTER — Ambulatory Visit (HOSPITAL_COMMUNITY)
Admission: RE | Admit: 2017-01-30 | Discharge: 2017-01-30 | Disposition: A | Discharge status: Home / Self Care | Payer: Medicare Other | Source: Ambulatory Visit | Attending: Nurse Practitioner | Admitting: Nurse Practitioner

## 2017-01-30 ENCOUNTER — Encounter (HOSPITAL_COMMUNITY): Payer: Self-pay

## 2017-01-30 ENCOUNTER — Encounter (HOSPITAL_COMMUNITY)
Admission: RE | Admit: 2017-01-30 | Discharge: 2017-01-30 | Disposition: A | Discharge status: Home / Self Care | Payer: Medicare Other | Source: Ambulatory Visit | Attending: Internal Medicine | Admitting: Internal Medicine

## 2017-01-30 ENCOUNTER — Other Ambulatory Visit: Payer: Self-pay

## 2017-01-30 DIAGNOSIS — M5030 Other cervical disc degeneration, unspecified cervical region: Secondary | ICD-10-CM | POA: Insufficient documentation

## 2017-01-30 DIAGNOSIS — M542 Cervicalgia: Secondary | ICD-10-CM

## 2017-01-30 DIAGNOSIS — M47812 Spondylosis without myelopathy or radiculopathy, cervical region: Secondary | ICD-10-CM | POA: Diagnosis not present

## 2017-01-30 LAB — CBC
HEMATOCRIT: 33.5 % — AB (ref 36.0–46.0)
Hemoglobin: 11.2 g/dL — ABNORMAL LOW (ref 12.0–15.0)
MCH: 33.7 pg (ref 26.0–34.0)
MCHC: 33.4 g/dL (ref 30.0–36.0)
MCV: 100.9 fL — ABNORMAL HIGH (ref 78.0–100.0)
Platelets: 201 10*3/uL (ref 150–400)
RBC: 3.32 MIL/uL — ABNORMAL LOW (ref 3.87–5.11)
RDW: 13 % (ref 11.5–15.5)
WBC: 5.8 10*3/uL (ref 4.0–10.5)

## 2017-01-30 LAB — BASIC METABOLIC PANEL
ANION GAP: 9 (ref 5–15)
BUN: 23 mg/dL — ABNORMAL HIGH (ref 6–20)
CALCIUM: 9.2 mg/dL (ref 8.9–10.3)
CO2: 25 mmol/L (ref 22–32)
Chloride: 104 mmol/L (ref 101–111)
Creatinine, Ser: 0.62 mg/dL (ref 0.44–1.00)
GFR calc Af Amer: >60 mL/min (ref >60)
GFR calc non Af Amer: >60 mL/min (ref >60)
GLUCOSE: 105 mg/dL — AB (ref 65–99)
Potassium: 3.5 mmol/L (ref 3.5–5.1)
Sodium: 138 mmol/L (ref 135–145)

## 2017-02-06 ENCOUNTER — Encounter (HOSPITAL_COMMUNITY)
Admission: RE | Disposition: A | Discharge status: Home / Self Care | Payer: Self-pay | Source: Ambulatory Visit | Attending: Internal Medicine

## 2017-02-06 ENCOUNTER — Ambulatory Visit (HOSPITAL_COMMUNITY)
Admission: RE | Admit: 2017-02-06 | Discharge: 2017-02-06 | Disposition: A | Discharge status: Home / Self Care | Payer: Medicare Other | Source: Ambulatory Visit | Attending: Internal Medicine | Admitting: Internal Medicine

## 2017-02-06 ENCOUNTER — Encounter (HOSPITAL_COMMUNITY): Payer: Self-pay

## 2017-02-06 ENCOUNTER — Ambulatory Visit (HOSPITAL_COMMUNITY): Payer: Medicare Other | Admitting: Anesthesiology

## 2017-02-06 DIAGNOSIS — K5090 Crohn's disease, unspecified, without complications: Secondary | ICD-10-CM | POA: Insufficient documentation

## 2017-02-06 DIAGNOSIS — I10 Essential (primary) hypertension: Secondary | ICD-10-CM | POA: Insufficient documentation

## 2017-02-06 DIAGNOSIS — F329 Major depressive disorder, single episode, unspecified: Secondary | ICD-10-CM | POA: Diagnosis not present

## 2017-02-06 DIAGNOSIS — K222 Esophageal obstruction: Secondary | ICD-10-CM | POA: Diagnosis not present

## 2017-02-06 DIAGNOSIS — Z79899 Other long term (current) drug therapy: Secondary | ICD-10-CM | POA: Diagnosis not present

## 2017-02-06 DIAGNOSIS — K219 Gastro-esophageal reflux disease without esophagitis: Secondary | ICD-10-CM | POA: Insufficient documentation

## 2017-02-06 DIAGNOSIS — M8580 Other specified disorders of bone density and structure, unspecified site: Secondary | ICD-10-CM | POA: Insufficient documentation

## 2017-02-06 DIAGNOSIS — R1013 Epigastric pain: Secondary | ICD-10-CM

## 2017-02-06 DIAGNOSIS — K317 Polyp of stomach and duodenum: Secondary | ICD-10-CM | POA: Diagnosis not present

## 2017-02-06 DIAGNOSIS — R1310 Dysphagia, unspecified: Secondary | ICD-10-CM | POA: Diagnosis not present

## 2017-02-06 DIAGNOSIS — M797 Fibromyalgia: Secondary | ICD-10-CM | POA: Diagnosis not present

## 2017-02-06 DIAGNOSIS — Z885 Allergy status to narcotic agent status: Secondary | ICD-10-CM | POA: Insufficient documentation

## 2017-02-06 DIAGNOSIS — K449 Diaphragmatic hernia without obstruction or gangrene: Secondary | ICD-10-CM | POA: Insufficient documentation

## 2017-02-06 HISTORY — PX: POLYPECTOMY: SHX5525

## 2017-02-06 HISTORY — PX: ESOPHAGOGASTRODUODENOSCOPY (EGD) WITH PROPOFOL: SHX5813

## 2017-02-06 HISTORY — PX: MALONEY DILATION: SHX5535

## 2017-02-06 SURGERY — ESOPHAGOGASTRODUODENOSCOPY (EGD) WITH PROPOFOL
Anesthesia: Monitor Anesthesia Care

## 2017-02-06 MED ORDER — LACTATED RINGERS IV SOLN
INTRAVENOUS | Status: DC
Start: 2017-02-06 — End: 2017-02-06
  Administered 2017-02-06: 10:00:00 via INTRAVENOUS

## 2017-02-06 MED ORDER — PROPOFOL 500 MG/50ML IV EMUL
INTRAVENOUS | Status: DC | PRN
  Administered 2017-02-06: 100 ug/kg/min via INTRAVENOUS

## 2017-02-06 MED ORDER — DEXAMETHASONE SODIUM PHOSPHATE 4 MG/ML IJ SOLN
INTRAMUSCULAR | Status: AC
  Filled 2017-02-06: qty 1

## 2017-02-06 MED ORDER — SCOPOLAMINE 1 MG/3DAYS TD PT72
MEDICATED_PATCH | TRANSDERMAL | Status: AC
  Filled 2017-02-06: qty 1

## 2017-02-06 MED ORDER — SIMETHICONE 40 MG/0.6ML PO SUSP
ORAL | Status: AC
  Filled 2017-02-06: qty 0.6

## 2017-02-06 MED ORDER — LIDOCAINE VISCOUS 2 % MT SOLN
OROMUCOSAL | Status: AC
  Filled 2017-02-06: qty 15

## 2017-02-06 MED ORDER — MIDAZOLAM HCL 2 MG/2ML IJ SOLN
1.0000 mg | INTRAMUSCULAR | Status: AC
  Administered 2017-02-06: 2 mg via INTRAVENOUS

## 2017-02-06 MED ORDER — ONDANSETRON HCL 4 MG/2ML IJ SOLN
INTRAMUSCULAR | Status: AC
  Filled 2017-02-06: qty 2

## 2017-02-06 MED ORDER — FENTANYL CITRATE (PF) 100 MCG/2ML IJ SOLN
25.0000 ug | Freq: Once | INTRAMUSCULAR | Status: AC
  Administered 2017-02-06: 25 ug via INTRAVENOUS

## 2017-02-06 MED ORDER — FENTANYL CITRATE (PF) 100 MCG/2ML IJ SOLN
INTRAMUSCULAR | Status: AC
  Filled 2017-02-06: qty 2

## 2017-02-06 MED ORDER — MIDAZOLAM HCL 5 MG/5ML IJ SOLN
INTRAMUSCULAR | Status: DC | PRN
  Administered 2017-02-06 (×2): 1 mg via INTRAVENOUS

## 2017-02-06 MED ORDER — CHLORHEXIDINE GLUCONATE CLOTH 2 % EX PADS: 6.0000 | MEDICATED_PAD | Freq: Once | CUTANEOUS | Status: DC

## 2017-02-06 MED ORDER — ONDANSETRON HCL 4 MG/2ML IJ SOLN
4.0000 mg | Freq: Once | INTRAMUSCULAR | Status: AC
  Administered 2017-02-06: 4 mg via INTRAVENOUS

## 2017-02-06 MED ORDER — MIDAZOLAM HCL 2 MG/2ML IJ SOLN
INTRAMUSCULAR | Status: AC
  Filled 2017-02-06: qty 2

## 2017-02-06 MED ORDER — LIDOCAINE VISCOUS 2 % MT SOLN
15.0000 mL | Freq: Once | OROMUCOSAL | Status: AC
  Administered 2017-02-06: 3 mL via OROMUCOSAL

## 2017-02-06 MED ORDER — PROPOFOL 10 MG/ML IV BOLUS
INTRAVENOUS | Status: AC
  Filled 2017-02-06: qty 40

## 2017-02-06 NOTE — Op Note (Signed)
[facility] Patient Name: Sheri [name] Procedure Date: 02/06/2017 9:42 AM MRN: [id number] Date of Birth: 09-18-1950 Attending MD: Norvel Richards , MD CSN: [id number] Age: 66 Admit Type: Outpatient Procedure:                Upper GI endoscopy Indications:              Dysphagia Providers:                Norvel Richards, MD, Lurline Del, RN, Nelma Rothman, Technician, Randa Spike, Technician Referring MD:              Medicines:                Propofol per Anesthesia Complications:            No immediate complications. Estimated Blood Loss:     Estimated blood loss was minimal. Procedure:                Pre-Anesthesia Assessment:                           - Prior to the procedure, a History and Physical                            was performed, and patient medications and                            allergies were reviewed. The patient's tolerance of                            previous anesthesia was also reviewed. The risks                            and benefits of the procedure and the sedation                            options and risks were discussed with the patient.                            All questions were answered, and informed consent                            was obtained. Prior Anticoagulants: The patient has                            taken no previous anticoagulant or antiplatelet                            agents. ASA Grade Assessment: II - A patient with                            mild systemic disease. After reviewing the risks  and benefits, the patient was deemed in                            satisfactory condition to undergo the procedure.                           - Prior to the procedure, a History and Physical                            was performed, and patient medications and                            allergies were reviewed. The patient's tolerance of                            previous  anesthesia was also reviewed. The risks                            and benefits of the procedure and the sedation                            options and risks were discussed with the patient.                            All questions were answered, and informed consent                            was obtained. Prior Anticoagulants: The patient has                            taken no previous anticoagulant or antiplatelet                            agents. After reviewing the risks and benefits, the                            patient was deemed in satisfactory condition to                            undergo the procedure.                           After obtaining informed consent, the endoscope was                            passed under direct vision. Throughout the                            procedure, the patient's blood pressure, pulse, and                            oxygen saturations were monitored continuously. The  [id number] ([id number]) scope was introduced through the                            and advanced to the second part of duodenum. The                            upper GI endoscopy was accomplished without                            difficulty. The patient tolerated the procedure                            well. Scope In: 10:17:42 AM Scope Out: 10:26:46 AM Total Procedure Duration: 0 hours 9 minutes 4 seconds  Findings:      A mild Schatzki ring (acquired) was found at the gastroesophageal       junction.      No other significant abnormalities were identified in a careful       examination of the esophagus.      A small hiatal hernia was present.      Multiple 3 mm pedunculated and sessile polyps were found in the gastric       fundus.      The duodenal bulb and second portion of the duodenum were normal. The       scope was withdrawn. Dilation was performed with a Maloney dilator with       mild resistance at 56 Fr. The dilation site was examined  following       endoscope reinsertion and showed mild improvement in luminal narrowing.       Estimated blood loss was minimal. utilizing biopsy forceps, 3 "bites" of       the ring were taken to additionally disrupt it. This was done without       difficulty or apparent complication.Finally, a gastric polyp was       biopsied with a cold forceps for histology. Estimated blood loss was       minimal. Impression:               - Mild Schatzki ring. Dilated.                           - Small hiatal hernia.                           - Multiple gastric polyps. Biopsied.                           - Normal duodenal bulb and second portion of the                            duodenum. Moderate Sedation:      Moderate (conscious) sedation was personally administered by an       anesthesia professional. The following parameters were monitored: oxygen       saturation, heart rate, blood pressure, respiratory rate, EKG, adequacy       of pulmonary ventilation, and response to care. Total physician       intraservice time was 15 minutes. Recommendation:           -  Patient has a contact number available for                            emergencies. The signs and symptoms of potential                            delayed complications were discussed with the                            patient. Return to normal activities tomorrow.                            Written discharge instructions were provided to the                            patient.                           - Resume previous diet.                           - Continue present medications. Continue Dexilant                            60 mg daily.                           - Await pathology results.                           - Await pathology results.                           - No Repeat upper endoscopy.                           - Return to GI office in 3 months. Procedure Code(s):        --- Professional ---                           [phone],  Esophagogastroduodenoscopy, flexible,                            transoral; with biopsy, single or multiple                           43450, Dilation of esophagus, by unguided sound or                            bougie, single or multiple passes Diagnosis Code(s):        --- Professional ---                           K22.2, Esophageal obstruction                           K44.9, Diaphragmatic hernia without obstruction or  gangrene                           K31.7, Polyp of stomach and duodenum                           R13.10, Dysphagia, unspecified CPT copyright 2016 American Medical Association. All rights reserved. The codes documented in this report are preliminary and upon coder review may  be revised to meet current compliance requirements. Cristopher Estimable. Avir Deruiter, MD Norvel Richards, MD 02/06/2017 10:35:39 AM This report has been signed electronically. Number of Addenda: 0

## 2017-02-06 NOTE — Anesthesia Postprocedure Evaluation (Signed)
Anesthesia Post Note  Patient: Sheri [name]  Procedure(s) Performed: ESOPHAGOGASTRODUODENOSCOPY (EGD) WITH PROPOFOL (N/A ) MALONEY DILATION (N/A ) POLYPECTOMY  Patient location during evaluation: PACU Anesthesia Type: MAC Level of consciousness: awake and patient cooperative Pain management: pain level controlled Vital Signs Assessment: post-procedure vital signs reviewed and stable Respiratory status: spontaneous breathing, nonlabored ventilation and respiratory function stable Cardiovascular status: blood pressure returned to baseline Postop Assessment: no apparent nausea or vomiting Anesthetic complications: no     Last Vitals:  Vitals:   02/06/17 0955 02/06/17 1038  BP: 136/70 138/76  Pulse:  89  Resp: 16 10  Temp:  (P) 36.6 C  SpO2: 96% 100%    Last Pain:  Vitals:   02/06/17 1031  TempSrc:   PainSc: 0-No pain                 [name]

## 2017-02-06 NOTE — H&P (Signed)
@LOGO @   Primary Care Physician:  Monico Blitz, MD Primary Gastroenterologist:  Dr. Gala Romney  Pre-Procedure History & Physical: HPI:  Sheri [name] is a 65 y.o. female here for further evaluation of dysphagia via EGD. Seen in the office previously. Started on Dexilant 60 mg daily. This is medication has squelched her upper GI tract symptoms. Still with dysphagia, however.  Past Medical History:  Diagnosis Date  . Anemia   . Chronic back pain   . Chronic neck pain   . Complication of anesthesia   . Crohn disease ([place])   . Fibromyalgia   . GERD (gastroesophageal reflux disease)   . Hypertension   . Low back pain   . Osteopenia    Last DEXA 12/2010 was normal, on fosamax  . PONV (postoperative nausea and vomiting)     Past Surgical History:  Procedure Laterality Date  . APPENDECTOMY  1990  . CATARACT EXTRACTION W/PHACO Left 01/13/2014   Procedure: CATARACT EXTRACTION PHACO AND INTRAOCULAR LENS PLACEMENT LEFT EYE;  Surgeon: Tonny Branch, MD;  Location: [facility];  Service: Ophthalmology;  Laterality: Left;  CDE:6.90  . CATARACT EXTRACTION W/PHACO Right 01/24/2014   Procedure: CATARACT EXTRACTION PHACO AND INTRAOCULAR LENS PLACEMENT (IOC);  Surgeon: Tonny Branch, MD;  Location: [facility];  Service: Ophthalmology;  Laterality: Right;  CDE:5.91  . CESAREAN SECTION  [id number]  . COLONOSCOPY  09/11/2006  . COLONOSCOPY  10/30/2010   RMR: External and internal hemorrhoids, likely source of hematochezia  otherwise normal rectum/ Left-sided diverticula, status post prior right hemicolectomy with a friable, inflamed, stenotic surgical anastomosis with upstream dilation of the neoterminal ileum/ Crohn's noted, status post biopsy  . COLONOSCOPY N/A 06/23/2014   Dr. Rourk:friable eroded mucosa at the anastomosis with small bowel, consistent with some stenosis Crohn's disease s/p biopsy. Colonic diverticulosis and external hemorrhoids. Pathology with benign anastomotic mucosa, no dysplasia.   Marland Kitchen drainage of back  abscess  2007  . ESOPHAGOGASTRODUODENOSCOPY  04/2009   noncritical schatzki's ring, small hh, sb bx negative  . SMALL INTESTINE SURGERY  [phone]    Prior to Admission medications   Medication Sig Start Date End Date Taking? Authorizing Provider  acetaminophen (TYLENOL) 500 MG tablet Take 1,000 mg by mouth every 6 (six) hours as needed for moderate pain or headache.   Yes [provider]  azaTHIOprine (IMURAN) 50 MG tablet TAKE 2 & 1/2 TABLETS DAILY. Patient taking differently: Take 125 mg by mouth daily.  06/26/15  Yes Annitta Needs, NP  Biotin 10 MG CAPS Take 20 mg by mouth daily.   Yes [provider]  Calcium Carbonate-Vitamin D (CALCIUM 600+D PO) Take 1 capsule by mouth 3 (three) times daily.    Yes [provider]  Cholecalciferol (EQL VITAMIN D3) 2000 units CAPS Take 2,000 Units by mouth daily.   Yes [provider]  citalopram (CELEXA) 20 MG tablet 1  qam Patient taking differently: Take 20 mg by mouth daily.  11/15/16  Yes Plovsky, Berneta Sages, MD  cyanocobalamin (,VITAMIN B-12,) 1000 MCG/ML injection Inject 1,000 mcg into the muscle every 14 (fourteen) days.   Yes [provider]  Cyanocobalamin (VITAMIN B-12 PO) Take 1 tablet by mouth daily.   Yes [provider]  cyclobenzaprine (FLEXERIL) 10 MG tablet Take 10 mg by mouth 3 (three) times daily.    Yes [provider]  dexlansoprazole (DEXILANT) 60 MG capsule Take 1 capsule (60 mg total) by mouth daily. 01/15/17  Yes Carlis Stable, NP  ergocalciferol (VITAMIN D2)  50000 units capsule Take 50,000 Units by mouth every Sunday.   Yes [provider]  gabapentin (NEURONTIN) 300 MG capsule 1 bid for 4 days 1  qam  2  qhs  For 1 week then 1 qam 3  qhs Patient taking differently: Take 300-900 mg by mouth See admin instructions. Take 300 mg by mouth twice daily for 4 days, then take 300 mg by mouth in the morning and take 600 mg by mouth at bedtime for 1 week. Then take 300 mg by  mouth in the morning and take 900 mg by mouth at bedtime. 01/22/17  Yes Plovsky, Berneta Sages, MD  ketoconazole (NIZORAL) 2 % cream Apply 1 application topically daily as needed for irritation.   Yes [provider]  Javier Docker Oil 350 MG CAPS Take 350 mg by mouth daily.   Yes [provider]  lisinopril-hydrochlorothiazide (PRINZIDE,ZESTORETIC) 20-12.5 MG per tablet Take 1 tablet by mouth daily. 06/19/10  Yes Mahala Menghini, PA-C  Loratadine 10 MG CAPS Take 10 mg by mouth daily.    Yes [provider]  nortriptyline (PAMELOR) 25 MG capsule 2  qhs Patient taking differently: Take 75 mg by mouth at bedtime.  11/15/16  Yes Plovsky, Berneta Sages, MD  Omega-3 Fatty Acids (FISH OIL PO) Take 1 capsule by mouth at bedtime.   Yes [provider]  ondansetron (ZOFRAN) 4 MG tablet Take 4 mg by mouth every 4 (four) hours as needed for nausea or vomiting.    Yes [provider]  oxyCODONE (OXYCONTIN) 10 mg 12 hr tablet Take 10 mg by mouth every 12 (twelve) hours.   Yes [provider]  oxyCODONE (ROXICODONE) 15 MG immediate release tablet Take 15 mg by mouth every 6 (six) hours as needed for pain.  11/12/10  Yes [provider]  PENTASA 500 MG CR capsule TAKE (2) CAPSULES FOUR TIMES DAILY. Patient taking differently: Take 1500 mg by mouth in the morning, take 1000 mg by mouth in the afternoon and take 1500 mg by mouth at bedtime 01/12/17  Yes Mahala Menghini, PA-C  temazepam (RESTORIL) 15 MG capsule 2  qhs Patient taking differently: Take 30 mg by mouth at bedtime.  01/22/17  Yes Plovsky, Berneta Sages, MD  VITAMIN E PO Take 1 Units by mouth 2 (two) times daily.    Yes [provider]  zinc gluconate 50 MG tablet Take 50 mg by mouth daily.     Yes [provider]    Allergies as of 12/31/2016 - Review Complete 12/31/2016  Allergen Reaction Noted  . Codeine sulfate      Family History  Problem Relation Age of Onset  . Crohn's disease Mother         succumbed to stomach cancer in her 91s  . Colon cancer Neg Hx     Social History   Socioeconomic History  . Marital status: Legally Separated    Spouse name: Not on file  . Number of children: 2  . Years of education: Not on file  . Highest education level: Not on file  Social Needs  . Financial resource strain: Not on file  . Food insecurity - worry: Not on file  . Food insecurity - inability: Not on file  . Transportation needs - medical: Not on file  . Transportation needs - non-medical: Not on file  Occupational History  . Occupation: disability    Employer: NOT EMPLOYED  Tobacco Use  . Smoking status: Never Smoker  . Smokeless tobacco:  Never Used  Substance and Sexual Activity  . Alcohol use: No  . Drug use: No  . Sexual activity: Yes    Birth control/protection: None  Other Topics Concern  . Not on file  Social History Narrative  . Not on file    Review of Systems: See HPI, otherwise negative ROS  Physical Exam: BP (!) 150/82   Pulse 94   Temp 98.6 F (37 C) (Oral)   Resp 15   Ht 5' 10"  (1.778 m)   Wt 210 lb (95.3 kg)   SpO2 95%   BMI 30.13 kg/m  General:   Alert,  Well-developed, well-nourished, pleasant and cooperative in NAD Neck:  Supple; no masses or thyromegaly. No significant cervical adenopathy. Lungs:  Clear throughout to auscultation.   No wheezes, crackles, or rhonchi. No acute distress. Heart:  Regular rate and rhythm; no murmurs, clicks, rubs,  or gallops. Abdomen: Non-distended, normal bowel sounds.  Soft and nontender without appreciable mass or hepatosplenomegaly.  Pulses:  Normal pulses noted. Extremities:  Without clubbing or edema.  Impression:   Pleasant 66 year old lady with esophageal dysphagia; history of a known Schatzki's ring.. Reflux symptoms well controlled now on Dexilant 60 mg daily.  Further evaluation/intervention of dysphagia warranted per plan.  Recommendations:  I have offered the patient an  EGD with ED as  feasible/appropriate per plan.  The risks, benefits, limitations, alternatives and imponderables have been reviewed with the patient. Potential for esophageal dilation, biopsy, etc. have also been reviewed.  Questions have been answered. All parties agreeable.   Notice: This dictation was prepared with Dragon dictation along with smaller phrase technology. Any transcriptional errors that result from this process are unintentional and may not be corrected upon review.

## 2017-02-06 NOTE — Discharge Instructions (Signed)
Gastroesophageal Reflux Disease, Adult Normally, food travels down the esophagus and stays in the stomach to be digested. However, when a person has gastroesophageal reflux disease (GERD), food and stomach acid move back up into the esophagus. When this happens, the esophagus becomes sore and inflamed. Over time, GERD can create small holes (ulcers) in the lining of the esophagus. What are the causes? This condition is caused by a problem with the muscle between the esophagus and the stomach (lower esophageal sphincter, or LES). Normally, the LES muscle closes after food passes through the esophagus to the stomach. When the LES is weakened or abnormal, it does not close properly, and that allows food and stomach acid to go back up into the esophagus. The LES can be weakened by certain dietary substances, medicines, and medical conditions, including: Tobacco use. Pregnancy. Having a hiatal hernia. Heavy alcohol use. Certain foods and beverages, such as coffee, chocolate, onions, and peppermint.  What increases the risk? This condition is more likely to develop in: People who have an increased body weight. People who have connective tissue disorders. People who use NSAID medicines.  What are the signs or symptoms? Symptoms of this condition include: Heartburn. Difficult or painful swallowing. The feeling of having a lump in the throat. Abitter taste in the mouth. Bad breath. Having a large amount of saliva. Having an upset or bloated stomach. Belching. Chest pain. Shortness of breath or wheezing. Ongoing (chronic) cough or a night-time cough. Wearing away of tooth enamel. Weight loss.  Different conditions can cause chest pain. Make sure to see your health care provider if you experience chest pain. How is this diagnosed? Your health care provider will take a medical history and perform a physical exam. To determine if you have mild or severe GERD, your health care provider may also  monitor how you respond to treatment. You may also have other tests, including: An endoscopy toexamine your stomach and esophagus with a small camera. A test thatmeasures the acidity level in your esophagus. A test thatmeasures how much pressure is on your esophagus. A barium swallow or modified barium swallow to show the shape, size, and functioning of your esophagus.  How is this treated? The goal of treatment is to help relieve your symptoms and to prevent complications. Treatment for this condition may vary depending on how severe your symptoms are. Your health care provider may recommend: Changes to your diet. Medicine. Surgery.  Follow these instructions at home: Diet Follow a diet as recommended by your health care provider. This may involve avoiding foods and drinks such as: Coffee and tea (with or without caffeine). Drinks that containalcohol. Energy drinks and sports drinks. Carbonated drinks or sodas. Chocolate and cocoa. Peppermint and mint flavorings. Garlic and onions. Horseradish. Spicy and acidic foods, including peppers, chili powder, curry powder, vinegar, hot sauces, and barbecue sauce. Citrus fruit juices and citrus fruits, such as oranges, lemons, and limes. Tomato-based foods, such as red sauce, chili, salsa, and pizza with red sauce. Fried and fatty foods, such as donuts, french fries, potato chips, and high-fat dressings. High-fat meats, such as hot dogs and fatty cuts of red and white meats, such as rib eye steak, sausage, ham, and bacon. High-fat dairy items, such as whole milk, butter, and cream cheese. Eat small, frequent meals instead of large meals. Avoid drinking large amounts of liquid with your meals. Avoid eating meals during the 2-3 hours before bedtime. Avoid lying down right after you eat. Do not exercise right after you  eat. General instructions Pay attention to any changes in your symptoms. Take over-the-counter and prescription medicines  only as told by your health care provider. Do not take aspirin, ibuprofen, or other NSAIDs unless your health care provider told you to do so. Do not use any tobacco products, including cigarettes, chewing tobacco, and e-cigarettes. If you need help quitting, ask your health care provider. Wear loose-fitting clothing. Do not wear anything tight around your waist that causes pressure on your abdomen. Raise (elevate) the head of your bed 6 inches (15cm). Try to reduce your stress, such as with yoga or meditation. If you need help reducing stress, ask your health care provider. If you are overweight, reduce your weight to an amount that is healthy for you. Ask your health care provider for guidance about a safe weight loss goal. Keep all follow-up visits as told by your health care provider. This is important. Contact a health care provider if: You have new symptoms. You have unexplained weight loss. You have difficulty swallowing, or it hurts to swallow. You have wheezing or a persistent cough. Your symptoms do not improve with treatment. You have a hoarse voice. Get help right away if: You have pain in your arms, neck, jaw, teeth, or back. You feel sweaty, dizzy, or light-headed. You have chest pain or shortness of breath. You vomit and your vomit looks like blood or coffee grounds. You faint. Your stool is bloody or black. You cannot swallow, drink, or eat. This information is not intended to replace advice given to you by your health care provider. Make sure you discuss any questions you have with your health care provider. Document Released: 11/07/2004 Document Revised: 06/28/2015 Document Reviewed: 05/25/2014 Elsevier Interactive Patient Education  2018 [employer]. EGD Discharge instructions Please read the instructions outlined below and refer to this sheet in the next few weeks. These discharge instructions provide you with general information on caring for yourself after you leave  the hospital. Your doctor may also give you specific instructions. While your treatment has been planned according to the most current medical practices available, unavoidable complications occasionally occur. If you have any problems or questions after discharge, please call your doctor. ACTIVITY  You may resume your regular activity but move at a slower pace for the next 24 hours.   Take frequent rest periods for the next 24 hours.   Walking will help expel (get rid of) the air and reduce the bloated feeling in your abdomen.   No driving for 24 hours (because of the anesthesia (medicine) used during the test).   You may shower.   Do not sign any important legal documents or operate any machinery for 24 hours (because of the anesthesia used during the test).  NUTRITION  Drink plenty of fluids.   You may resume your normal diet.   Begin with a light meal and progress to your normal diet.   Avoid alcoholic beverages for 24 hours or as instructed by your caregiver.  MEDICATIONS  You may resume your normal medications unless your caregiver tells you otherwise.  WHAT YOU CAN EXPECT TODAY  You may experience abdominal discomfort such as a feeling of fullness or gas pains.  FOLLOW-UP  Your doctor will discuss the results of your test with you.  SEEK IMMEDIATE MEDICAL ATTENTION IF ANY OF THE FOLLOWING OCCUR:  Excessive nausea (feeling sick to your stomach) and/or vomiting.   Severe abdominal pain and distention (swelling).   Trouble swallowing.   Temperature over 101  F (37.8 C).   Rectal bleeding or vomiting of blood.    GERD information performed  Continue Dexilant 60 mg daily  Further recommendations to follow pending review of pathology report  Office visit with [place] in 6 months

## 2017-02-06 NOTE — Anesthesia Preprocedure Evaluation (Signed)
Anesthesia Evaluation  Patient identified by MRN, date of birth, ID band Patient awake    Reviewed: Allergy & Precautions, H&P , NPO status , Patient's Chart, lab work & pertinent test results  History of Anesthesia Complications (+) PONV and history of anesthetic complications  Airway Mallampati: II  TM Distance: >3 FB     Dental  (+) Teeth Intact   Pulmonary neg pulmonary ROS,    breath sounds clear to auscultation       Cardiovascular hypertension, Pt. on medications  Rhythm:Regular Rate:Normal     Neuro/Psych  Headaches, PSYCHIATRIC DISORDERS Depression  Neuromuscular disease    GI/Hepatic GERD  Medicated,Crohn's Dx    Endo/Other    Renal/GU      Musculoskeletal  (+) Fibromyalgia -  Abdominal   Peds  Hematology   Anesthesia Other Findings   Reproductive/Obstetrics                             Anesthesia Physical Anesthesia Plan  ASA: II  Anesthesia Plan: MAC   Post-op Pain Management:    Induction: Intravenous  PONV Risk Score and Plan:   Airway Management Planned: Simple Face Mask  Additional Equipment:   Intra-op Plan:   Post-operative Plan:   Informed Consent: I have reviewed the patients History and Physical, chart, labs and discussed the procedure including the risks, benefits and alternatives for the proposed anesthesia with the patient or authorized representative who has indicated his/her understanding and acceptance.     Plan Discussed with:   Anesthesia Plan Comments:         Anesthesia Quick Evaluation

## 2017-02-06 NOTE — Transfer of Care (Signed)
Immediate Anesthesia Transfer of Care Note  Patient: Sheri [name]  Procedure(s) Performed: ESOPHAGOGASTRODUODENOSCOPY (EGD) WITH PROPOFOL (N/A ) MALONEY DILATION (N/A ) POLYPECTOMY  Patient Location: PACU  Anesthesia Type:MAC  Level of Consciousness: drowsy  Airway & Oxygen Therapy: Patient Spontanous Breathing and Patient connected to face mask oxygen  Post-op Assessment: Report given to RN, Post -op Vital signs reviewed and stable and Patient moving all extremities  Post vital signs: Reviewed and stable  Last Vitals:  Vitals:   02/06/17 0950 02/06/17 0955  BP:  136/70  Pulse:    Resp: 11 16  Temp:    SpO2: 96% 96%    Last Pain:  Vitals:   02/06/17 1031  TempSrc:   PainSc: 0-No pain      Patients Stated Pain Goal: 6 (17/00/17 4944)  Complications: No apparent anesthesia complications

## 2017-02-10 ENCOUNTER — Encounter (HOSPITAL_COMMUNITY): Payer: Self-pay | Admitting: Internal Medicine

## 2017-02-13 ENCOUNTER — Encounter: Payer: Self-pay | Admitting: Internal Medicine

## 2017-03-03 ENCOUNTER — Encounter: Payer: Self-pay | Admitting: Gastroenterology

## 2017-03-03 ENCOUNTER — Ambulatory Visit (INDEPENDENT_AMBULATORY_CARE_PROVIDER_SITE_OTHER): Payer: Medicare Other | Admitting: Gastroenterology

## 2017-03-03 VITALS — BP 143/79 | HR 112 | Temp 97.0°F | Ht 69.0 in | Wt 218.2 lb

## 2017-03-03 DIAGNOSIS — K5080 Crohn's disease of both small and large intestine without complications: Secondary | ICD-10-CM | POA: Diagnosis not present

## 2017-03-03 DIAGNOSIS — D539 Nutritional anemia, unspecified: Secondary | ICD-10-CM | POA: Diagnosis not present

## 2017-03-03 NOTE — Progress Notes (Signed)
Referring Provider: Monico Blitz, MD Primary Care Physician:  Monico Blitz, MD Primary GI: Dr. Gala Romney   Chief Complaint  Patient presents with  . Crohn's Disease    HPI:   Sheri [name] is a 67 y.o. 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 Due to  rectal bleeding in May 2016, underwent updated colonoscopy. Notedfriable eroded mucosa at the anastomosis with small bowel, consistent with some stenosis Crohn's disease s/p biopsy. Colonic diverticulosis and external hemorrhoids. Pathology with benign anastomotic mucosa, no dysplasia. Recently completed EGD due to dysphagia, with mild Schatzki's ring at GE junction s/p dilation , small hiatal hernia, multile 3 mm pedunculated and sessile polyps in gastric fundus. Duodenum normal.   In May of last year, it was noted she had an elevated MCV and MCH. We had recommended serum B12, TSH, and folate. This has not been completed. Denies abdominal pain. Bowel habits at baseline. No rectal bleeding.   DEXA scan on file from Dec 2017: normal.   Past Medical History:  Diagnosis Date  . Anemia   . Chronic back pain   . Chronic neck pain   . Complication of anesthesia   . Crohn disease ([place])   . Fibromyalgia   . GERD (gastroesophageal reflux disease)   . Hypertension   . Low back pain   . Osteopenia    Last DEXA 12/2010 was normal, on fosamax  . PONV (postoperative nausea and vomiting)     Past Surgical History:  Procedure Laterality Date  . APPENDECTOMY  1990  . CATARACT EXTRACTION W/PHACO Left 01/13/2014   Procedure: CATARACT EXTRACTION PHACO AND INTRAOCULAR LENS PLACEMENT LEFT EYE;  Surgeon: Tonny Branch, MD;  Location: [facility];  Service: Ophthalmology;  Laterality: Left;  CDE:6.90  . CATARACT EXTRACTION W/PHACO Right 01/24/2014   Procedure: CATARACT EXTRACTION PHACO AND INTRAOCULAR LENS PLACEMENT (IOC);  Surgeon: Tonny Branch, MD;  Location: [facility];  Service: Ophthalmology;  Laterality:  Right;  CDE:5.91  . CESAREAN SECTION  [id number]  . COLONOSCOPY  09/11/2006  . COLONOSCOPY  10/30/2010   RMR: External and internal hemorrhoids, likely source of hematochezia  otherwise normal rectum/ Left-sided diverticula, status post prior right hemicolectomy with a friable, inflamed, stenotic surgical anastomosis with upstream dilation of the neoterminal ileum/ Crohn's noted, status post biopsy  . COLONOSCOPY N/A 06/23/2014   Dr. Rourk:friable eroded mucosa at the anastomosis with small bowel, consistent with some stenosis Crohn's disease s/p biopsy. Colonic diverticulosis and external hemorrhoids. Pathology with benign anastomotic mucosa, no dysplasia.   Marland Kitchen drainage of back abscess  2007  . ESOPHAGOGASTRODUODENOSCOPY  04/2009   noncritical schatzki's ring, small hh, sb bx negative  . ESOPHAGOGASTRODUODENOSCOPY (EGD) WITH PROPOFOL N/A 02/06/2017   Dr. Gala Romney: mild schatzki's ring at GE junction s/p dilation, small hiatal hernia, multiple 3 mm pedunculated and sessile polyps in gastric fundus, normal duodenum  . MALONEY DILATION N/A 02/06/2017   Procedure: Venia Minks DILATION;  Surgeon: Daneil Dolin, MD;  Location: [facility];  Service: Endoscopy;  Laterality: N/A;  . POLYPECTOMY  02/06/2017   Procedure: POLYPECTOMY;  Surgeon: Daneil Dolin, MD;  Location: [facility];  Service: Endoscopy;;  gastric  . SMALL INTESTINE SURGERY  [phone]    Current Outpatient Medications  Medication Sig Dispense Refill  . acetaminophen (TYLENOL) 500 MG tablet Take 1,000 mg by mouth every 6 (six) hours as needed for moderate pain or headache.    . azaTHIOprine (IMURAN) 50 MG  tablet TAKE 2 & 1/2 TABLETS DAILY. (Patient taking differently: Take 125 mg by mouth daily. ) 75 tablet 11  . Biotin 10 MG CAPS Take 20 mg by mouth daily.    . Calcium Carbonate-Vitamin D (CALCIUM 600+D PO) Take 1 capsule by mouth 3 (three) times daily.     . Cholecalciferol (EQL VITAMIN D3) 2000 units CAPS Take 2,000 Units by mouth  daily.    . citalopram (CELEXA) 20 MG tablet 1  qam (Patient taking differently: Take 20 mg by mouth daily. ) 30 tablet 3  . cyanocobalamin (,VITAMIN B-12,) 1000 MCG/ML injection Inject 1,000 mcg into the muscle every 14 (fourteen) days.    . cyclobenzaprine (FLEXERIL) 10 MG tablet Take 10 mg by mouth 3 (three) times daily.     Marland Kitchen dexlansoprazole (DEXILANT) 60 MG capsule Take 1 capsule (60 mg total) by mouth daily. 90 capsule 1  . ergocalciferol (VITAMIN D2) 50000 units capsule Take 50,000 Units by mouth every Sunday.    . gabapentin (NEURONTIN) 300 MG capsule 1 bid for 4 days 1  qam  2  qhs  For 1 week then 1 qam 3  qhs (Patient taking differently: Take 300-900 mg by mouth See admin instructions. Take 300 mg by mouth twice daily for 4 days, then take 300 mg by mouth in the morning and take 600 mg by mouth at bedtime for 1 week. Then take 300 mg by mouth in the morning and take 900 mg by mouth at bedtime.) 120 capsule 3  . ketoconazole (NIZORAL) 2 % cream Apply 1 application topically daily as needed for irritation.    Javier Docker Oil 350 MG CAPS Take 350 mg by mouth daily.    Marland Kitchen lisinopril-hydrochlorothiazide (PRINZIDE,ZESTORETIC) 20-12.5 MG per tablet Take 1 tablet by mouth daily. 30 tablet 0  . Loratadine 10 MG CAPS Take 10 mg by mouth daily.     . nortriptyline (PAMELOR) 25 MG capsule 2  qhs (Patient taking differently: Take 75 mg by mouth at bedtime. ) 60 capsule 4  . Omega-3 Fatty Acids (FISH OIL PO) Take 1 capsule by mouth at bedtime.    Marland Kitchen oxyCODONE (ROXICODONE) 15 MG immediate release tablet Take 15 mg by mouth every 6 (six) hours as needed for pain.     Marland Kitchen PENTASA 500 MG CR capsule TAKE (2) CAPSULES FOUR TIMES DAILY. (Patient taking differently: Take 1500 mg by mouth in the morning, take 1000 mg by mouth in the afternoon and take 1500 mg by mouth at bedtime) 240 capsule 5  . temazepam (RESTORIL) 15 MG capsule 2  qhs (Patient taking differently: Take 30 mg by mouth at bedtime. ) 60 capsule 3  .  VITAMIN E PO Take 1 Units by mouth 2 (two) times daily.     Marland Kitchen zinc gluconate 50 MG tablet Take 50 mg by mouth daily.      . Cyanocobalamin (VITAMIN B-12 PO) Take 1 tablet by mouth daily.    . ondansetron (ZOFRAN) 4 MG tablet Take 4 mg by mouth every 4 (four) hours as needed for nausea or vomiting.     Marland Kitchen oxyCODONE (OXYCONTIN) 10 mg 12 hr tablet Take 10 mg by mouth every 12 (twelve) hours.     No current facility-administered medications for this visit.     Allergies as of 03/03/2017 - Review Complete 03/03/2017  Allergen Reaction Noted  . Codeine sulfate Nausea Only     Family History  Problem Relation Age of Onset  . Crohn's disease Mother  succumbed to stomach cancer in her 7s  . Colon cancer Neg Hx     Social History   Socioeconomic History  . Marital status: Legally Separated    Spouse name: None  . Number of children: 2  . Years of education: None  . Highest education level: None  Social Needs  . Financial resource strain: None  . Food insecurity - worry: None  . Food insecurity - inability: None  . Transportation needs - medical: None  . Transportation needs - non-medical: None  Occupational History  . Occupation: disability    Employer: NOT EMPLOYED  Tobacco Use  . Smoking status: Never Smoker  . Smokeless tobacco: Never Used  Substance and Sexual Activity  . Alcohol use: No  . Drug use: No  . Sexual activity: Yes    Birth control/protection: None  Other Topics Concern  . None  Social History Narrative  . None    Review of Systems: Gen: Denies fever, chills, anorexia. Denies fatigue, weakness, weight loss.  CV: Denies chest pain, palpitations, syncope, peripheral edema, and claudication. Resp: Denies dyspnea at rest, cough, wheezing, coughing up blood, and pleurisy. GI: see HPI  Derm: Denies rash, itching, dry skin Psych: Denies depression, anxiety, memory loss, confusion. No homicidal or suicidal ideation.  Heme: Denies bruising, bleeding, and  enlarged lymph nodes.  Physical Exam: BP (!) 143/79   Pulse (!) 112   Temp (!) 97 F (36.1 C) (Oral)   Ht 5' 9"  (1.753 m)   Wt 218 lb 3.2 oz (99 kg)   BMI 32.22 kg/m  General:   Alert and oriented. No distress noted. Pleasant and cooperative.  Head:  Normocephalic and atraumatic. Eyes:  Conjuctiva clear without scleral icterus. Mouth:  Oral mucosa pink and moist.  Abdomen:  +BS, soft, non-tender and non-distended. No rebound or guarding. No HSM or masses noted. Msk:  Symmetrical without gross deformities. Normal posture. Extremities:  Without edema. Neurologic:  Alert and  oriented x4 Psych:  Alert and cooperative. Normal mood and affect.  Lab Results  Component Value Date   WBC 5.8 01/30/2017   HGB 11.2 (L) 01/30/2017   HCT 33.5 (L) 01/30/2017   MCV 100.9 (H) 01/30/2017   PLT 201 01/30/2017

## 2017-03-03 NOTE — Patient Instructions (Signed)
Please have blood work done today. I will look and see when the next bone scan and colonoscopy is due.   I will see you in 6 months!  It was good to see you!

## 2017-03-04 ENCOUNTER — Encounter: Payer: Self-pay | Admitting: Internal Medicine

## 2017-03-04 ENCOUNTER — Encounter: Payer: Self-pay | Admitting: Neurology

## 2017-03-04 ENCOUNTER — Ambulatory Visit (INDEPENDENT_AMBULATORY_CARE_PROVIDER_SITE_OTHER): Payer: Medicare Other | Admitting: Neurology

## 2017-03-04 VITALS — BP 110/62 | HR 97 | Ht 68.5 in | Wt 220.0 lb

## 2017-03-04 DIAGNOSIS — F329 Major depressive disorder, single episode, unspecified: Secondary | ICD-10-CM

## 2017-03-04 DIAGNOSIS — G3184 Mild cognitive impairment, so stated: Secondary | ICD-10-CM | POA: Diagnosis not present

## 2017-03-04 DIAGNOSIS — G894 Chronic pain syndrome: Secondary | ICD-10-CM

## 2017-03-04 DIAGNOSIS — F32A Depression, unspecified: Secondary | ICD-10-CM
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
## 2017-03-04 NOTE — Patient Instructions (Addendum)
1. Continue working with Dr. Casimiro Needle and therapist for depression 2. Continue working with Pain Management and potentially start reducing dose if able 3. Follow-up in 6 months, call for any changes  RECOMMENDATIONS FOR ALL PATIENTS WITH MEMORY PROBLEMS: 1. Continue to exercise (Recommend 30 minutes of walking everyday, or 3 hours every week) 2. Increase social interactions - continue going to [place] and enjoy social gatherings with friends and family 3. Eat healthy, avoid fried foods and eat more fruits and vegetables 4. Maintain adequate blood pressure, blood sugar, and blood cholesterol level. Reducing the risk of stroke and cardiovascular disease also helps promoting better memory. 5. Avoid stressful situations. Live a simple life and avoid aggravations. Organize your time and prepare for the next day in anticipation. 6. Sleep well, avoid any interruptions of sleep and avoid any distractions in the bedroom that may interfere with adequate sleep quality 7. Avoid sugar, avoid sweets as there is a strong link between excessive sugar intake, diabetes, and cognitive impairment We discussed the Mediterranean diet, which has been shown to help patients reduce the risk of progressive memory disorders and reduces cardiovascular risk. This includes eating fish, eat fruits and green leafy vegetables, nuts like almonds and hazelnuts, walnuts, and also use olive oil. Avoid fast foods and fried foods as much as possible. Avoid sweets and sugar as sugar use has been linked to worsening of memory function.  There is always a concern of gradual progression of memory problems. If this is the case, then we may need to adjust level of care according to patient needs. Support, both to the patient and caregiver, should then be put into place.

## 2017-03-06 LAB — NORTRIPTYLINE LEVEL: NORTRIPTYLINE LVL: 84 ug/L (ref 50–150)

## 2017-03-07 LAB — COMPLETE METABOLIC PANEL WITH GFR
AG RATIO: 1.3 (calc) (ref 1.0–2.5)
ALT: 26 U/L (ref 6–29)
AST: 29 U/L (ref 10–35)
Albumin: 4 g/dL (ref 3.6–5.1)
Alkaline phosphatase (APISO): 115 U/L (ref 33–130)
BILIRUBIN TOTAL: 1.5 mg/dL — AB (ref 0.2–1.2)
BUN: 14 mg/dL (ref 7–25)
CHLORIDE: 102 mmol/L (ref 98–110)
CO2: 29 mmol/L (ref 20–32)
Calcium: 10 mg/dL (ref 8.6–10.4)
Creat: 0.89 mg/dL (ref 0.50–0.99)
GFR, EST AFRICAN AMERICAN: 78 mL/min/{1.73_m2} (ref >=60)
GFR, Est Non African American: 68 mL/min/{1.73_m2} (ref >=60)
GLOBULIN: 3.2 g/dL (ref 1.9–3.7)
Glucose, Bld: 121 mg/dL (ref 65–139)
POTASSIUM: 4.3 mmol/L (ref 3.5–5.3)
SODIUM: 140 mmol/L (ref 135–146)
TOTAL PROTEIN: 7.2 g/dL (ref 6.1–8.1)

## 2017-03-07 LAB — B12 AND FOLATE PANEL: Folate: >24 ng/mL

## 2017-03-07 LAB — HOMOCYSTEINE: HOMOCYSTEINE: 8.5 umol/L (ref <10.4)

## 2017-03-07 LAB — METHYLMALONIC ACID, SERUM: Methylmalonic Acid, Quant: 96 nmol/L (ref 87–318)

## 2017-03-11 ENCOUNTER — Encounter: Payer: Self-pay | Admitting: Neurology

## 2017-03-12 NOTE — Progress Notes (Signed)
Please let patient know that she is not folate deficient. B12 elevated as she gets supplementation. LFTs are good. I'm reviewing with Hematology to see if she would benefit from a visit for macrocytic anemia. DEXA scan due in Dec 2019, and we can talk about colonoscopy when I see her in 6 months!

## 2017-03-12 NOTE — Assessment & Plan Note (Signed)
Continues on Imuran and Pentasa, clinically doing well. DEXA scan on file from Dec 2017. Due again in Dec 2019. Consider colonoscopy when she returns at next visit 6 months from now. Check CMP and additional labs as ordered due to macrocytic anemia.

## 2017-03-12 NOTE — Progress Notes (Signed)
cc'ed to pcp °

## 2017-03-12 NOTE — Assessment & Plan Note (Signed)
Check B12, TSH, folate, CMP, homocysteine level and methylmalonic acid.

## 2017-03-13 NOTE — Progress Notes (Signed)
ON RECALL  °

## 2017-03-17 NOTE — Progress Notes (Signed)
We can refer to Hematology for further detailed evaluation of macrocytic anemia. Could be medication-related, but I would feel better if she were evaluated. Is she willing to do this? If so, please refer. Thanks!

## 2017-03-18 ENCOUNTER — Other Ambulatory Visit: Payer: Self-pay

## 2017-03-18 DIAGNOSIS — D539 Nutritional anemia, unspecified: Secondary | ICD-10-CM

## 2017-03-19 ENCOUNTER — Ambulatory Visit (INDEPENDENT_AMBULATORY_CARE_PROVIDER_SITE_OTHER): Payer: Medicare Other | Admitting: Psychiatry

## 2017-03-19 ENCOUNTER — Encounter (HOSPITAL_COMMUNITY): Payer: Self-pay | Admitting: Psychiatry

## 2017-03-19 VITALS — BP 130/68 | HR 100 | Ht 67.0 in | Wt 218.0 lb

## 2017-03-19 DIAGNOSIS — F419 Anxiety disorder, unspecified: Secondary | ICD-10-CM | POA: Diagnosis not present

## 2017-03-19 DIAGNOSIS — F330 Major depressive disorder, recurrent, mild: Secondary | ICD-10-CM

## 2017-03-19 DIAGNOSIS — Z560 Unemployment, unspecified: Secondary | ICD-10-CM

## 2017-03-19 MED ORDER — DULOXETINE HCL 30 MG PO CPEP: ORAL_CAPSULE | ORAL | 3 refills | Status: DC

## 2017-03-19 MED ORDER — TEMAZEPAM 15 MG PO CAPS: 30.0000 mg | ORAL_CAPSULE | Freq: Every day | ORAL | 4 refills | Status: DC

## 2017-03-19 MED ORDER — GABAPENTIN 400 MG PO CAPS: ORAL_CAPSULE | ORAL | 4 refills | Status: DC
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
## 2017-04-07 ENCOUNTER — Telehealth (HOSPITAL_COMMUNITY): Payer: Self-pay

## 2017-04-07 NOTE — Telephone Encounter (Signed)
Patient is experiencing nausea and she is requesting a prescription, to help with the nausea. Please advise

## 2017-04-09 NOTE — Telephone Encounter (Signed)
Call  Primary care  For nausea issues

## 2017-04-10 NOTE — Telephone Encounter (Signed)
Called patient and left voicemail message asking patient to call office.

## 2017-04-14 ENCOUNTER — Ambulatory Visit (HOSPITAL_COMMUNITY): Payer: Self-pay | Admitting: Internal Medicine

## 2017-04-16 ENCOUNTER — Ambulatory Visit (HOSPITAL_COMMUNITY): Payer: Self-pay | Admitting: Internal Medicine

## 2017-04-16 ENCOUNTER — Ambulatory Visit (INDEPENDENT_AMBULATORY_CARE_PROVIDER_SITE_OTHER): Payer: Medicare Other | Admitting: Psychiatry

## 2017-04-16 DIAGNOSIS — Z736 Limitation of activities due to disability: Secondary | ICD-10-CM | POA: Diagnosis not present

## 2017-04-16 DIAGNOSIS — Z560 Unemployment, unspecified: Secondary | ICD-10-CM | POA: Diagnosis not present

## 2017-04-16 DIAGNOSIS — F339 Major depressive disorder, recurrent, unspecified: Secondary | ICD-10-CM | POA: Diagnosis not present

## 2017-04-16 NOTE — Progress Notes (Signed)
Psychiatric Initial Adult Assessment   Patient Identification: Sheri [name] MRN:  [id number] Date of Evaluation:  04/16/2017 Referral Source: Dr.Karen Delice Lesch Chief Complaint:   Visit Diagnosis:  No diagnosis found.  History of Present Illness:  Today the patient is doing better. She says she's 30% better and has less anxiety. Her daughter Sheri [name] is with her and agrees.The patient has more energy and she's more feeling like she is more active. Her sleep is more normal. She's eating fine. Importantly is that she is less suspicious. She seems to be more easy-going. She's never been psychotic. Importantly also is for pain is less. Initially she is taking Neurontin at a lower dosebut last time we increased it to 400 mg 3 a dayshe says is distinctly helpful. We also increase her Cymbalta to 60 mg and then 120 mg. She is only  Been on the 120 mg for just 2 weeks.Physically the patient is better. She has less pain. This is very important she sleeping reasonably well. Associated Signs/S doctor. ymptoms: Depression Symptoms:  depressed mood, (Hypo) Manic Symptoms:   Anxiety Symptoms:   Psychotic Symptoms:   PTSD Symptoms:   Past Psychiatric History: Celexa 40 mg   Previous Psychotropic Medications: Yes   Substance Abuse History in the last 12 months:  No.  Consequences of Substance Abuse:   Past Medical History:  Past Medical History:  Diagnosis Date  . Anemia   . Chronic back pain   . Chronic neck pain   . Complication of anesthesia   . Crohn disease ([place])   . Fibromyalgia   . GERD (gastroesophageal reflux disease)   . Hypertension   . Low back pain   . Osteopenia    Last DEXA 12/2010 was normal, on fosamax  . PONV (postoperative nausea and vomiting)     Past Surgical History:  Procedure Laterality Date  . APPENDECTOMY  1990  . CATARACT EXTRACTION W/PHACO Left 01/13/2014   Procedure: CATARACT EXTRACTION PHACO AND INTRAOCULAR LENS PLACEMENT LEFT EYE;  Surgeon: Tonny Branch, MD;   Location: [facility];  Service: Ophthalmology;  Laterality: Left;  CDE:6.90  . CATARACT EXTRACTION W/PHACO Right 01/24/2014   Procedure: CATARACT EXTRACTION PHACO AND INTRAOCULAR LENS PLACEMENT (IOC);  Surgeon: Tonny Branch, MD;  Location: [facility];  Service: Ophthalmology;  Laterality: Right;  CDE:5.91  . CESAREAN SECTION  [id number]  . COLONOSCOPY  09/11/2006  . COLONOSCOPY  10/30/2010   RMR: External and internal hemorrhoids, likely source of hematochezia  otherwise normal rectum/ Left-sided diverticula, status post prior right hemicolectomy with a friable, inflamed, stenotic surgical anastomosis with upstream dilation of the neoterminal ileum/ Crohn's noted, status post biopsy  . COLONOSCOPY N/A 06/23/2014   Dr. Rourk:friable eroded mucosa at the anastomosis with small bowel, consistent with some stenosis Crohn's disease s/p biopsy. Colonic diverticulosis and external hemorrhoids. Pathology with benign anastomotic mucosa, no dysplasia.   Marland Kitchen drainage of back abscess  2007  . ESOPHAGOGASTRODUODENOSCOPY  04/2009   noncritical schatzki's ring, small hh, sb bx negative  . ESOPHAGOGASTRODUODENOSCOPY (EGD) WITH PROPOFOL N/A 02/06/2017   Dr. Gala Romney: mild schatzki's ring at GE junction s/p dilation, small hiatal hernia, multiple 3 mm pedunculated and sessile polyps in gastric fundus, normal duodenum  . MALONEY DILATION N/A 02/06/2017   Procedure: Venia Minks DILATION;  Surgeon: Daneil Dolin, MD;  [facility];  Service: Endoscopy;  Laterality: N/A;  . POLYPECTOMY  02/06/2017   Procedure: POLYPECTOMY;  Surgeon: Daneil Dolin, MD;  Location: [facility];  Service: Endoscopy;;  gastric  . SMALL INTESTINE SURGERY  [id number]    Family Psychiatric History:   Family History:  Family History  Problem Relation Age of Onset  . Crohn's disease Mother        succumbed to stomach cancer in her 53s  . Colon cancer Neg Hx     Social History:   Social History   Socioeconomic History  . Marital status:  Legally Separated    Spouse name: Not on file  . Number of children: 2  . Years of education: Not on file  . Highest education level: Not on file  Social Needs  . Financial resource strain: Not on file  . Food insecurity - worry: Not on file  . Food insecurity - inability: Not on file  . Transportation needs - medical: Not on file  . Transportation needs - non-medical: Not on file  Occupational History  . Occupation: disability    Employer: NOT EMPLOYED  Tobacco Use  . Smoking status: Never Smoker  . Smokeless tobacco: Never Used  Substance and Sexual Activity  . Alcohol use: No  . Drug use: No  . Sexual activity: Yes    Birth control/protection: None  Other Topics Concern  . Not on file  Social History Narrative  . Not on file    Additional Social History:   Allergies:   Allergies  Allergen Reactions  . Codeine Sulfate Nausea Only    Metabolic Disorder Labs: No results found for: HGBA1C, MPG No results found for: PROLACTIN No results found for: CHOL, TRIG, HDL, CHOLHDL, VLDL, LDLCALC   Current Medications: Current Outpatient Medications  Medication Sig Dispense Refill  . acetaminophen (TYLENOL) 500 MG tablet Take 1,000 mg by mouth every 6 (six) hours as needed for moderate pain or headache.    . azaTHIOprine (IMURAN) 50 MG tablet TAKE 2 & 1/2 TABLETS DAILY. (Patient taking differently: Take 125 mg by mouth daily. ) 75 tablet 11  . Biotin 10 MG CAPS Take 20 mg by mouth daily.    . Calcium Carbonate-Vitamin D (CALCIUM 600+D PO) Take 1 capsule by mouth 3 (three) times daily.     . Cholecalciferol (EQL VITAMIN D3) 2000 units CAPS Take 2,000 Units by mouth daily.    . citalopram (CELEXA) 20 MG tablet 1  qam (Patient taking differently: Take 20 mg by mouth daily. ) 30 tablet 3  . cyanocobalamin (,VITAMIN B-12,) 1000 MCG/ML injection Inject 1,000 mcg into the muscle every 14 (fourteen) days.    . cyclobenzaprine (FLEXERIL) 10 MG tablet Take 10 mg by mouth 3 (three) times  daily.     Marland Kitchen dexlansoprazole (DEXILANT) 60 MG capsule Take 1 capsule (60 mg total) by mouth daily. 90 capsule 1  . DULoxetine (CYMBALTA) 30 MG capsule 1  qam  For  1week then  2  qam 60 capsule 3  . ergocalciferol (VITAMIN D2) 50000 units capsule Take 50,000 Units by mouth every Sunday.    . gabapentin (NEURONTIN) 400 MG capsule 1  qam   2  qhs 90 capsule 4  . ketoconazole (NIZORAL) 2 % cream Apply 1 application topically daily as needed for irritation.    Javier Docker Oil 350 MG CAPS Take 350 mg by mouth daily.    Marland Kitchen lisinopril-hydrochlorothiazide (PRINZIDE,ZESTORETIC) 20-12.5 MG per tablet Take 1 tablet by mouth daily. 30 tablet 0  . Loratadine 10 MG CAPS Take 10 mg by mouth daily.     . nortriptyline (PAMELOR) 25 MG capsule 2  qhs (Patient taking  differently: Take 75 mg by mouth at bedtime. ) 60 capsule 4  . Omega-3 Fatty Acids (FISH OIL PO) Take 1 capsule by mouth at bedtime.    Marland Kitchen oxyCODONE (ROXICODONE) 15 MG immediate release tablet Take 15 mg by mouth every 6 (six) hours as needed for pain.     Marland Kitchen PENTASA 500 MG CR capsule TAKE (2) CAPSULES FOUR TIMES DAILY. (Patient taking differently: Take 1500 mg by mouth in the morning, take 1000 mg by mouth in the afternoon and take 1500 mg by mouth at bedtime) 240 capsule 5  . temazepam (RESTORIL) 15 MG capsule Take 2 capsules (30 mg total) by mouth at bedtime. 60 capsule 4  . VITAMIN E PO Take 1 Units by mouth 2 (two) times daily.     Marland Kitchen zinc gluconate 50 MG tablet Take 50 mg by mouth daily.       No current facility-administered medications for this visit.     Neurologic: Headache:  Seizure: No Paresthesias:No  Musculoskeletal: Strength & Muscle Tone: within normal limits Gait & Station: normal Patient leans: Right  Psychiatric Specialty Exam: ROS  There were no vitals taken for this visit.There is no height or weight on file to calculate BMI.  General Appearance: Casual  Eye Contact:  Fair  Speech:  Negative  Volume:  Normal  Mood:  NA   Affect:  Congruent  Thought Process:  Coherent  Orientation:  NA  Thought Content:  WDL  Suicidal Thoughts:  No  Homicidal Thoughts:  No  Memory:  Negative  Judgement:  Good  Insight:  Present  Psychomotor Activity:  Normal  Concentration:    Recall:    Fund of Knowledge:Good  Language: Fair  Akathisia:  No  Handed:  Right  AIMS (if indicated):    Assets:    ADL's:    Cognition:   nt Plan Summary: 3/6/20195:04 PM    On her last visit we discontinued her nortriptylineas well as her Celexa. She now is 120 mg of Cymbaltabut only for 2 weeks. We'll give her another 5 weeks being on see her back in 5 weeks. She'll continue taking Neurontin 400 mg 1 in the morning and 2 at night. The patient still in the process by therapist. She actually found a therapist but is yet to see her. The patient certainly is not suicidal. I believe she is functioning better. I believe overall 30-40% better. She'll continue taking the same dose of medications as she is taking and return to see me in approximately 5 weeks.

## 2017-04-29 ENCOUNTER — Ambulatory Visit (HOSPITAL_COMMUNITY): Payer: Self-pay | Admitting: Hematology

## 2017-05-07 ENCOUNTER — Inpatient Hospital Stay (HOSPITAL_COMMUNITY): Payer: Medicare Other

## 2017-05-07 ENCOUNTER — Encounter (HOSPITAL_COMMUNITY): Payer: Self-pay | Admitting: Hematology

## 2017-05-07 ENCOUNTER — Inpatient Hospital Stay (HOSPITAL_COMMUNITY): Payer: Medicare Other | Attending: Internal Medicine | Admitting: Hematology

## 2017-05-07 VITALS — BP 145/65 | HR 118 | Temp 97.9°F | Resp 16 | Ht 67.0 in | Wt 219.3 lb

## 2017-05-07 DIAGNOSIS — Z9842 Cataract extraction status, left eye: Secondary | ICD-10-CM | POA: Diagnosis not present

## 2017-05-07 DIAGNOSIS — I10 Essential (primary) hypertension: Secondary | ICD-10-CM | POA: Diagnosis not present

## 2017-05-07 DIAGNOSIS — Z79899 Other long term (current) drug therapy: Secondary | ICD-10-CM | POA: Insufficient documentation

## 2017-05-07 DIAGNOSIS — G8929 Other chronic pain: Secondary | ICD-10-CM | POA: Diagnosis not present

## 2017-05-07 DIAGNOSIS — K219 Gastro-esophageal reflux disease without esophagitis: Secondary | ICD-10-CM | POA: Insufficient documentation

## 2017-05-07 DIAGNOSIS — K449 Diaphragmatic hernia without obstruction or gangrene: Secondary | ICD-10-CM | POA: Insufficient documentation

## 2017-05-07 DIAGNOSIS — K5090 Crohn's disease, unspecified, without complications: Secondary | ICD-10-CM | POA: Insufficient documentation

## 2017-05-07 DIAGNOSIS — D539 Nutritional anemia, unspecified: Secondary | ICD-10-CM | POA: Diagnosis present

## 2017-05-07 DIAGNOSIS — Z85828 Personal history of other malignant neoplasm of skin: Secondary | ICD-10-CM | POA: Diagnosis not present

## 2017-05-07 DIAGNOSIS — M797 Fibromyalgia: Secondary | ICD-10-CM | POA: Diagnosis not present

## 2017-05-07 DIAGNOSIS — D7589 Other specified diseases of blood and blood-forming organs: Secondary | ICD-10-CM | POA: Diagnosis not present

## 2017-05-07 DIAGNOSIS — M542 Cervicalgia: Secondary | ICD-10-CM | POA: Diagnosis not present

## 2017-05-07 LAB — RETICULOCYTES
RBC.: 3.72 MIL/uL — AB (ref 3.87–5.11)
RETIC CT PCT: 1.5 % (ref 0.4–3.1)
Retic Count, Absolute: 55.8 10*3/uL (ref 19.0–186.0)

## 2017-05-07 LAB — CBC WITH DIFFERENTIAL/PLATELET
BASOS PCT: 1 %
Basophils Absolute: 0 10*3/uL (ref 0.0–0.1)
EOS ABS: 0.1 10*3/uL (ref 0.0–0.7)
EOS PCT: 2 %
HCT: 37.3 % (ref 36.0–46.0)
Hemoglobin: 12.7 g/dL (ref 12.0–15.0)
LYMPHS ABS: 1.9 10*3/uL (ref 0.7–4.0)
Lymphocytes Relative: 33 %
MCH: 34.1 pg — AB (ref 26.0–34.0)
MCHC: 34 g/dL (ref 30.0–36.0)
MCV: 100.3 fL — ABNORMAL HIGH (ref 78.0–100.0)
MONOS PCT: 9 %
Monocytes Absolute: 0.5 10*3/uL (ref 0.1–1.0)
NEUTROS PCT: 55 %
Neutro Abs: 3.2 10*3/uL (ref 1.7–7.7)
Platelets: 211 10*3/uL (ref 150–400)
RBC: 3.72 MIL/uL — ABNORMAL LOW (ref 3.87–5.11)
RDW: 13.6 % (ref 11.5–15.5)
WBC: 5.7 10*3/uL (ref 4.0–10.5)

## 2017-05-07 LAB — TSH: TSH: 1.934 u[IU]/mL (ref 0.350–4.500)

## 2017-05-07 LAB — LACTATE DEHYDROGENASE: LDH: 142 U/L (ref 98–192)

## 2017-05-07 NOTE — Progress Notes (Signed)
CONSULT NOTE  Patient Care Team: Monico Blitz, MD as PCP - General (Internal Medicine) Gala Romney, Cristopher Estimable, MD (Gastroenterology)  CHIEF COMPLAINTS/PURPOSE OF CONSULTATION:  Further workup of macrocytic anemia  HISTORY OF PRESENTING ILLNESS:  Sheri [name] 67 y.o. female is seen in consultation today for further workup of microcytic anemia.  Her last CBC showed hemoglobin of 11.2 with an MCV of 100.9.  She very rarely sees blood in the stool, and thinks it is coming from a small hemorrhoid.  Her Crohn's disease has been well controlled, and she has about 2-5 bowel movements per day.  She denies any fevers, night sweats or weight loss.  She does have hot flashes.  Last colonoscopy was in May 2016 which showed friable mucosa at the small bowel anastomosis.  Some diverticulosis and external hemorrhoid was seen.  She takes B12 injections twice a month.  She has never taken iron tablet in the past.   MEDICAL HISTORY:  Past Medical History:  Diagnosis Date  . Anemia   . Chronic back pain   . Chronic neck pain   . Complication of anesthesia   . Crohn disease ([place])   . Fibromyalgia   . GERD (gastroesophageal reflux disease)   . Hypertension   . Low back pain   . Osteopenia    Last DEXA 12/2010 was normal, on fosamax  . PONV (postoperative nausea and vomiting)   . Skin cancer    sees Dr. Tarri Glenn in [place]    SURGICAL HISTORY: Past Surgical History:  Procedure Laterality Date  . APPENDECTOMY  1990  . CATARACT EXTRACTION W/PHACO Left 01/13/2014   Procedure: CATARACT EXTRACTION PHACO AND INTRAOCULAR LENS PLACEMENT LEFT EYE;  Surgeon: Tonny Branch, MD;  Location: [facility];  Service: Ophthalmology;  Laterality: Left;  CDE:6.90  . CATARACT EXTRACTION W/PHACO Right 01/24/2014   Procedure: CATARACT EXTRACTION PHACO AND INTRAOCULAR LENS PLACEMENT (IOC);  Surgeon: Tonny Branch, MD;  Location: [facility];  Service: Ophthalmology;  Laterality: Right;  CDE:5.91  . CESAREAN SECTION  [id number]  . COLONOSCOPY   09/11/2006  . COLONOSCOPY  10/30/2010   RMR: External and internal hemorrhoids, likely source of hematochezia  otherwise normal rectum/ Left-sided diverticula, status post prior right hemicolectomy with a friable, inflamed, stenotic surgical anastomosis with upstream dilation of the neoterminal ileum/ Crohn's noted, status post biopsy  . COLONOSCOPY N/A 06/23/2014   Dr. Rourk:friable eroded mucosa at the anastomosis with small bowel, consistent with some stenosis Crohn's disease s/p biopsy. Colonic diverticulosis and external hemorrhoids. Pathology with benign anastomotic mucosa, no dysplasia.   Marland Kitchen drainage of back abscess  2007  . ESOPHAGOGASTRODUODENOSCOPY  04/2009   noncritical schatzki's ring, small hh, sb bx negative  . ESOPHAGOGASTRODUODENOSCOPY (EGD) WITH PROPOFOL N/A 02/06/2017   Dr. Gala Romney: mild schatzki's ring at GE junction s/p dilation, small hiatal hernia, multiple 3 mm pedunculated and sessile polyps in gastric fundus, normal duodenum  . MALONEY DILATION N/A 02/06/2017   Procedure: Venia Minks DILATION;  Surgeon: Daneil Dolin, MD;  Location: [facility];  Service: Endoscopy;  Laterality: N/A;  . POLYPECTOMY  02/06/2017   Procedure: POLYPECTOMY;  Surgeon: Daneil Dolin, MD;  Location: [facility];  Service: Endoscopy;;  gastric  . SMALL INTESTINE SURGERY  [phone]    SOCIAL HISTORY: Social History   Socioeconomic History  . Marital status: Legally Separated    Spouse name: Not on file  . Number of children: 2  . Years of education: 43  . Highest education level: 12th grade  Occupational History  . Occupation: disability    Employer: NOT EMPLOYED  Social Needs  . Financial resource strain: Not very hard  . Food insecurity:    Worry: Never true    Inability: Never true  . Transportation needs:    Medical: No    Non-medical: No  Tobacco Use  . Smoking status: Never Smoker  . Smokeless tobacco: Never Used  Substance and Sexual Activity  . Alcohol use: No  . Drug  use: No  . Sexual activity: Yes    Birth control/protection: None  Lifestyle  . Physical activity:    Days per week: 0 days    Minutes per session: 0 min  . Stress: To some extent  Relationships  . Social connections:    Talks on phone: More than three times a week    Gets together: More than three times a week    Attends religious service: Never    Active member of club or organization: No    Attends meetings of clubs or organizations: Never    Relationship status: Separated  . Intimate partner violence:    Fear of current or ex partner: No    Emotionally abused: No    Physically abused: No    Forced sexual activity: No  Other Topics Concern  . Not on file  Social History Narrative  . Not on file    FAMILY HISTORY: Family History  Problem Relation Age of Onset  . Crohn's disease Mother        succumbed to stomach cancer in her 40s  . Stomach cancer Mother   . Heart attack Father   . COPD Father   . Colon cancer Neg Hx     ALLERGIES:  is allergic to codeine sulfate.  MEDICATIONS:  Current Outpatient Medications  Medication Sig Dispense Refill  . acetaminophen (TYLENOL) 500 MG tablet Take 1,000 mg by mouth every 6 (six) hours as needed for moderate pain or headache.    . azaTHIOprine (IMURAN) 50 MG tablet TAKE 2 & 1/2 TABLETS DAILY. (Patient taking differently: Take 125 mg by mouth daily. ) 75 tablet 11  . Biotin 10 MG CAPS Take 20 mg by mouth daily.    . Calcium Carbonate-Vitamin D (CALCIUM 600+D PO) Take 1 capsule by mouth 3 (three) times daily.     . Cholecalciferol (EQL VITAMIN D3) 2000 units CAPS Take 2,000 Units by mouth daily.    . citalopram (CELEXA) 20 MG tablet 1  qam (Patient taking differently: Take 20 mg by mouth daily. ) 30 tablet 3  . cyanocobalamin (,VITAMIN B-12,) 1000 MCG/ML injection Inject 1,000 mcg into the muscle every 14 (fourteen) days.    . cyclobenzaprine (FLEXERIL) 10 MG tablet Take 10 mg by mouth 3 (three) times daily.     Marland Kitchen  dexlansoprazole (DEXILANT) 60 MG capsule Take 1 capsule (60 mg total) by mouth daily. 90 capsule 1  . DULoxetine (CYMBALTA) 30 MG capsule 1  qam  For  1week then  2  qam 60 capsule 3  . ergocalciferol (VITAMIN D2) 50000 units capsule Take 50,000 Units by mouth every Sunday.    . gabapentin (NEURONTIN) 400 MG capsule 1  qam   2  qhs 90 capsule 4  . ketoconazole (NIZORAL) 2 % cream Apply 1 application topically daily as needed for irritation.    Javier Docker Oil 350 MG CAPS Take 350 mg by mouth daily.    Marland Kitchen lisinopril-hydrochlorothiazide (PRINZIDE,ZESTORETIC) 20-12.5 MG per tablet Take 1 tablet by  mouth daily. 30 tablet 0  . Loratadine 10 MG CAPS Take 10 mg by mouth daily.     . nortriptyline (PAMELOR) 25 MG capsule 2  qhs (Patient taking differently: Take 75 mg by mouth at bedtime. ) 60 capsule 4  . Omega-3 Fatty Acids (FISH OIL PO) Take 1 capsule by mouth at bedtime.    Marland Kitchen oxyCODONE (ROXICODONE) 15 MG immediate release tablet Take 15 mg by mouth every 6 (six) hours as needed for pain.     Marland Kitchen PENTASA 500 MG CR capsule TAKE (2) CAPSULES FOUR TIMES DAILY. (Patient taking differently: Take 1500 mg by mouth in the morning, take 1000 mg by mouth in the afternoon and take 1500 mg by mouth at bedtime) 240 capsule 5  . temazepam (RESTORIL) 15 MG capsule Take 2 capsules (30 mg total) by mouth at bedtime. 60 capsule 4  . VITAMIN E PO Take 1 Units by mouth 2 (two) times daily.     Marland Kitchen zinc gluconate 50 MG tablet Take 50 mg by mouth daily.       No current facility-administered medications for this visit.     REVIEW OF SYSTEMS:   Constitutional: Denies fevers, chills or abnormal night sweats Eyes: Denies blurriness of vision, double vision or watery eyes Ears, nose, mouth, throat, and face: Denies mucositis or sore throat Respiratory: Denies cough, dyspnea or wheezes Cardiovascular: Denies palpitation, chest discomfort or lower extremity swelling Gastrointestinal: Denies any nausea or vomiting.  Denies any change  in bowel habits.  She has about 2-5 bowel movements per day. Skin: Denies abnormal skin rashes Lymphatics: Denies new lymphadenopathy or easy bruising Neurological:Denies numbness, tingling or new weaknesses.  Her neck and back pain has been stable. Behavioral/Psych: Mood is stable, no new changes  All other systems were reviewed with the patient and are negative.  PHYSICAL EXAMINATION: ECOG PERFORMANCE STATUS: 1 - Symptomatic but completely ambulatory  Vitals:   05/07/17 1332  BP: (!) 145/65  Pulse: (!) 118  Resp: 16  Temp: 97.9 F (36.6 C)  SpO2: 96%   Filed Weights   05/07/17 1332  Weight: 219 lb 4.8 oz (99.5 kg)    GENERAL:alert, no distress and comfortable SKIN: skin color, texture, turgor are normal, no rashes or significant lesions EYES: normal, conjunctiva are pink and non-injected, sclera clear OROPHARYNX:no exudate, no erythema and lips, buccal mucosa, and tongue normal  NECK: supple, thyroid normal size, non-tender, without nodularity LYMPH:  no palpable lymphadenopathy in the cervical, axillary or inguinal LUNGS: clear to auscultation and percussion with normal breathing effort HEART: regular rate & rhythm and no murmurs and no lower extremity edema ABDOMEN:abdomen soft, non-tender and normal bowel sounds Musculoskeletal:no cyanosis of digits and no clubbing  PSYCH: alert & oriented x 3 with fluent speech NEURO: no focal motor/sensory deficits  LABORATORY DATA:  I have reviewed the data as listed Recent Results (from the past 2160 hour(s))  Methylmalonic Acid     Status: None   Collection Time: 03/03/17  3:13 PM  Result Value Ref Range   Methylmalonic Acid, Quant 96 87 - 318 nmol/L  Homocysteine     Status: None   Collection Time: 03/03/17  3:13 PM  Result Value Ref Range   Homocysteine 8.5 <10.4 umol/L    Comment: Homocysteine is increased by functional deficiency of  folate or vitamin B12.  Testing for methylmalonic acid  differentiates between these  deficiencies.  Other causes  of increased homocysteine include renal failure, folate  antagonists such as methotrexate  and phenytoin, and  exposure to nitrous oxide.   B12 and Folate Panel     Status: Abnormal   Collection Time: 03/03/17  3:13 PM  Result Value Ref Range   Vitamin B-12 >2,000 (H) 200 - 1,100 pg/mL   Folate >24.0 ng/mL    Comment:                            Reference Range                            Low:           <3.4                            Borderline:    3.4-5.4                            Normal:        >5.4 .   COMPLETE METABOLIC PANEL WITH GFR     Status: Abnormal   Collection Time: 03/03/17  3:13 PM  Result Value Ref Range   Glucose, Bld 121 65 - 139 mg/dL    Comment: .        Non-fasting reference interval .    BUN 14 7 - 25 mg/dL   Creat 0.89 0.50 - 0.99 mg/dL    Comment: For patients >58 years of age, the reference limit for Creatinine is approximately 13% higher for people identified as African-American. .    GFR, Est Non African American 68 > OR = 60 mL/min/1.27m   GFR, Est African American 78 > OR = 60 mL/min/1.75m  BUN/Creatinine Ratio NOT APPLICABLE 6 - 22 (calc)   Sodium 140 135 - 146 mmol/L   Potassium 4.3 3.5 - 5.3 mmol/L   Chloride 102 98 - 110 mmol/L   CO2 29 20 - 32 mmol/L   Calcium 10.0 8.6 - 10.4 mg/dL   Total Protein 7.2 6.1 - 8.1 g/dL   Albumin 4.0 3.6 - 5.1 g/dL   Globulin 3.2 1.9 - 3.7 g/dL (calc)   AG Ratio 1.3 1.0 - 2.5 (calc)   Total Bilirubin 1.5 (H) 0.2 - 1.2 mg/dL   Alkaline phosphatase (APISO) 115 33 - 130 U/L   AST 29 10 - 35 U/L   ALT 26 6 - 29 U/L  Nortriptyline level     Status: None   Collection Time: 03/03/17  3:14 PM  Result Value Ref Range   Nortriptyline Lvl 84 50 - 150 mcg/L    Comment: . This test was developed and its analytical performance characteristics have been determined by [facility] has not been cleared or approved by the U.S. Food and  Drug Administration. This assay has been validated pursuant to the CLIA regulations and is used for clinical purposes. . Marland Kitchen CBC with Differential/Platelet     Status: Abnormal   Collection Time: 05/07/17  2:54 PM  Result Value Ref Range   WBC 5.7 4.0 - 10.5 K/uL   RBC 3.72 (L) 3.87 - 5.11 MIL/uL   Hemoglobin 12.7 12.0 - 15.0 g/dL   HCT 37.3 36.0 - 46.0 %   MCV 100.3 (H) 78.0 - 100.0 fL   MCH 34.1 (H) 26.0 - 34.0 pg   MCHC 34.0 30.0 - 36.0 g/dL  RDW 13.6 11.5 - 15.5 %   Platelets 211 150 - 400 K/uL   Neutrophils Relative % 55 %   Neutro Abs 3.2 1.7 - 7.7 K/uL   Lymphocytes Relative 33 %   Lymphs Abs 1.9 0.7 - 4.0 K/uL   Monocytes Relative 9 %   Monocytes Absolute 0.5 0.1 - 1.0 K/uL   Eosinophils Relative 2 %   Eosinophils Absolute 0.1 0.0 - 0.7 K/uL   Basophils Relative 1 %   Basophils Absolute 0.0 0.0 - 0.1 K/uL    Comment: Performed at [facility], [address] [place], [place] [address]  Reticulocytes     Status: Abnormal   Collection Time: 05/07/17  2:54 PM  Result Value Ref Range   Retic Ct Pct 1.5 0.4 - 3.1 %   RBC. 3.72 (L) 3.87 - 5.11 MIL/uL   Retic Count, Absolute 55.8 19.0 - 186.0 K/uL    Comment: Performed at [facility], [address], [place], [place] [address]  Lactate dehydrogenase     Status: None   Collection Time: 05/07/17  2:54 PM  Result Value Ref Range   LDH 142 98 - 192 U/L    Comment: Performed at [facility], [address], [place], [place] [address]  TSH     Status: None   Collection Time: 05/07/17  2:55 PM  Result Value Ref Range   TSH 1.934 0.350 - 4.500 uIU/mL    Comment: Performed by a 3rd Generation assay with a functional sensitivity of <=0.01 uIU/mL. Performed at [facility], [address] [place],  [address]     RADIOGRAPHIC STUDIES: I have reviewed ultrasound of the abdomen dated 04/19/2017 which showed increased hepatic echogenicity, likely representing steatosis.  ASSESSMENT & PLAN:  Macrocytic anemia 1.  Macrocytic  anemia: Her last CBC showed hemoglobin of 11.2 with an MCV of 100.9.  Her E08 and folic acid levels were within normal limits.  She is taking B12 injections twice a month.  Differential diagnosis for macrocytosis includes liver disease, hypothyroidism, MDS and drugs.  She has been on Imuran for the last 12 years.  She also takes Pentasa for the past 20+ years.  Imuran is known to cause macrocytosis and macrocytic anemia.  It is also known to increase the bilirubin levels.  This patient also has elevated bilirubin for the past few times.  We will repeat a CBC today, send a TSH level, reticulocyte count and an LDH.  We will also send a serum protein electrophoresis to rule out myeloma which can be remote because of cause of macrocytosis.  We will see her back in 1-2 weeks for follow-up.  2.  Chronic back/neck pain: She is taking OxyContin 10 mg twice a day.  She also takes oxycodone 15 mg 4 times a day for breakthrough pain.  This is well controlled.     Derek Jack, MD 05/07/17 4:19 PM

## 2017-05-07 NOTE — Patient Instructions (Signed)
[place] at [facility] Discharge Instructions  You saw Dr. Raliegh Ip today.   Thank you for choosing [place] at [facility] to provide your oncology and hematology care.  To afford each patient quality time with our provider, please arrive at least 15 minutes before your scheduled appointment time.   If you have a lab appointment with the [facility] please come in thru the  Main Entrance and check in at the main information desk  You need to re-schedule your appointment should you arrive 10 or more minutes late.  We strive to give you quality time with our providers, and arriving late affects you and other patients whose appointments are after yours.  Also, if you no show three or more times for appointments you may be dismissed from the clinic at the providers discretion.     Again, thank you for choosing [facility].  Our hope is that these requests will decrease the amount of time that you wait before being seen by our physicians.       _____________________________________________________________  Should you have questions after your visit to [facility], please contact our office at [phone] between the hours of 8:30 a.m. and 4:30 p.m.  Voicemails left after 4:30 p.m. will not be returned until the following business day.  For prescription refill requests, have your pharmacy contact our office.       Resources For Cancer Patients and their Caregivers ? American Cancer Society: Can assist with transportation, wigs, general needs, runs Look Good Feel Better.        (419)373-0249 ? Cancer Care: Provides financial assistance, online support groups, medication/co-pay assistance.  1-800-813-HOPE 717-214-2610) ? [place] Assists [place] cancer patients and their families through emotional , educational and financial support.  (864)113-6054 ? [place] DSS Where to apply for food  stamps, Medicaid and utility assistance. 279-713-3421 ? [facility]: Transportation to medical appointments. 438-711-9989 ? Social Security Administration: May apply for disability if have a Stage IV cancer. (915)533-6600 (414)629-1392 ? [employer], Disability and Transit Services: Assists with nutrition, care and transit needs. [place] Support Programs:   > Cancer Support Group  2nd Tuesday of the month 1pm-2pm, Journey Room   > Creative Journey  3rd Tuesday of the month 1130am-1pm, Journey Room

## 2017-05-07 NOTE — Assessment & Plan Note (Signed)
1.  Macrocytic anemia: Her last CBC showed hemoglobin of 11.2 with an MCV of 100.9.  Her Z61 and folic acid levels were within normal limits.  She is taking B12 injections twice a month.  Differential diagnosis for macrocytosis includes liver disease, hypothyroidism, MDS and drugs.  She has been on Imuran for the last 12 years.  She also takes Pentasa for the past 20+ years.  Imuran is known to cause macrocytosis and macrocytic anemia.  It is also known to increase the bilirubin levels.  This patient also has elevated bilirubin for the past few times.  We will repeat a CBC today, send a TSH level, reticulocyte count and an LDH.  We will also send a serum protein electrophoresis to rule out myeloma which can be remote because of cause of macrocytosis.  We will see her back in 1-2 weeks for follow-up.  2.  Chronic back/neck pain: She is taking OxyContin 10 mg twice a day.  She also takes oxycodone 15 mg 4 times a day for breakthrough pain.  This is well controlled.

## 2017-05-08 LAB — FOLATE RBC
Folate, Hemolysate: 343.1 ng/mL
Folate, RBC: 917 ng/mL
Hematocrit: 37.4 % (ref 34.0–46.6)

## 2017-05-08 LAB — PROTEIN ELECTROPHORESIS, SERUM
A/G Ratio: 1.2 (ref 0.7–1.7)
ALPHA-2-GLOBULIN: 0.6 g/dL (ref 0.4–1.0)
Albumin ELP: 3.7 g/dL (ref 2.9–4.4)
Alpha-1-Globulin: 0.2 g/dL (ref 0.0–0.4)
BETA GLOBULIN: 1.3 g/dL (ref 0.7–1.3)
GAMMA GLOBULIN: 1.1 g/dL (ref 0.4–1.8)
Globulin, Total: 3.2 g/dL (ref 2.2–3.9)
Total Protein ELP: 6.9 g/dL (ref 6.0–8.5)

## 2017-05-13 ENCOUNTER — Other Ambulatory Visit: Payer: Self-pay | Admitting: Gastroenterology

## 2017-05-15 ENCOUNTER — Other Ambulatory Visit: Payer: Self-pay | Admitting: Gastroenterology

## 2017-05-23 ENCOUNTER — Encounter (HOSPITAL_COMMUNITY): Payer: Self-pay | Admitting: Hematology

## 2017-05-23 ENCOUNTER — Inpatient Hospital Stay (HOSPITAL_COMMUNITY): Payer: Medicare Other | Attending: Internal Medicine | Admitting: Hematology

## 2017-05-23 VITALS — BP 138/86 | HR 100 | Temp 97.6°F | Resp 18 | Wt 218.0 lb

## 2017-05-23 DIAGNOSIS — Z79899 Other long term (current) drug therapy: Secondary | ICD-10-CM | POA: Insufficient documentation

## 2017-05-23 DIAGNOSIS — D539 Nutritional anemia, unspecified: Secondary | ICD-10-CM

## 2017-05-23 NOTE — Progress Notes (Signed)
Patient Care Team: Monico Blitz, MD as PCP - General (Internal Medicine) Gala Romney Cristopher Estimable, MD (Gastroenterology)  DIAGNOSIS:  Encounter Diagnosis  Name Primary?  . Macrocytic anemia Yes     CHIEF COMPLIANT: Macrocytosis.  INTERVAL HISTORY: Sheri [name] is 67 year old very pleasant female who is seen for follow-up of macrocytosis.  I have done some blood work at last visit.  She denies any fevers, night sweats or weight loss.  She has been on Imuran for many years.  No infections or hospitalizations reported.  Denies any excessive alcohol intake.  REVIEW OF SYSTEMS:   Constitutional: Denies fevers, chills or abnormal weight loss.  She has mild fatigue. Eyes: Denies blurriness of vision Ears, nose, mouth, throat, and face: Denies mucositis or sore throat Respiratory: Denies cough, dyspnea or wheezes Cardiovascular: Denies palpitation, chest discomfort Gastrointestinal:  Denies nausea, heartburn or change in bowel habits.  She has chronic diarrhea. Skin: Denies abnormal skin rashes Lymphatics: Denies new lymphadenopathy or easy bruising Neurological:Denies numbness, tingling or new weaknesses Behavioral/Psych: Mood is stable, no new changes  Extremities: No lower extremity edema All other systems were reviewed with the patient and are negative.  I have reviewed the past medical history, past surgical history, social history and family history with the patient and they are unchanged from previous note.  ALLERGIES:  is allergic to codeine sulfate.  MEDICATIONS:  Current Outpatient Medications  Medication Sig Dispense Refill  . acetaminophen (TYLENOL) 500 MG tablet Take 1,000 mg by mouth every 6 (six) hours as needed for moderate pain or headache.    . azaTHIOprine (IMURAN) 50 MG tablet TAKE 2 & 1/2 TABLETS DAILY. (Patient taking differently: Take 125 mg by mouth daily. ) 75 tablet 11  . azaTHIOprine (IMURAN) 50 MG tablet TAKE 2&1/2 TABLETS DAILY 75 tablet 5  . azaTHIOprine  (IMURAN) 50 MG tablet TAKE 2&1/2 TABLETS DAILY 75 tablet 5  . Biotin 10 MG CAPS Take 20 mg by mouth daily.    . Calcium Carbonate-Vitamin D (CALCIUM 600+D PO) Take 1 capsule by mouth 3 (three) times daily.     . Cholecalciferol (EQL VITAMIN D3) 2000 units CAPS Take 2,000 Units by mouth daily.    . cyanocobalamin (,VITAMIN B-12,) 1000 MCG/ML injection Inject 1,000 mcg into the muscle every 14 (fourteen) days.    . cyclobenzaprine (FLEXERIL) 10 MG tablet Take 10 mg by mouth 3 (three) times daily.     Marland Kitchen dexlansoprazole (DEXILANT) 60 MG capsule Take 1 capsule (60 mg total) by mouth daily. 90 capsule 1  . DULoxetine (CYMBALTA) 30 MG capsule 1  qam  For  1week then  2  qam 60 capsule 3  . ergocalciferol (VITAMIN D2) 50000 units capsule Take 50,000 Units by mouth every Sunday.    . gabapentin (NEURONTIN) 400 MG capsule 1  qam   2  qhs 90 capsule 4  . ketoconazole (NIZORAL) 2 % cream Apply 1 application topically daily as needed for irritation.    Javier Docker Oil 350 MG CAPS Take 350 mg by mouth daily.    Marland Kitchen lisinopril-hydrochlorothiazide (PRINZIDE,ZESTORETIC) 20-12.5 MG per tablet Take 1 tablet by mouth daily. 30 tablet 0  . Loratadine 10 MG CAPS Take 10 mg by mouth daily.     . nortriptyline (PAMELOR) 25 MG capsule 2  qhs (Patient taking differently: Take 75 mg by mouth at bedtime. ) 60 capsule 4  . Omega-3 Fatty Acids (FISH OIL PO) Take 1 capsule by mouth at bedtime.    Marland Kitchen oxyCODONE (  OXYCONTIN) 10 mg 12 hr tablet Take 10 mg by mouth every 12 (twelve) hours.    Marland Kitchen oxyCODONE (ROXICODONE) 15 MG immediate release tablet Take 15 mg by mouth every 6 (six) hours as needed for pain.     Marland Kitchen PENTASA 500 MG CR capsule TAKE (2) CAPSULES FOUR TIMES DAILY. (Patient taking differently: Take 1500 mg by mouth in the morning, take 1000 mg by mouth in the afternoon and take 1500 mg by mouth at bedtime) 240 capsule 5  . temazepam (RESTORIL) 15 MG capsule Take 2 capsules (30 mg total) by mouth at bedtime. 60 capsule 4  . VITAMIN  E PO Take 1 Units by mouth 2 (two) times daily.     Marland Kitchen zinc gluconate 50 MG tablet Take 50 mg by mouth daily.       No current facility-administered medications for this visit.     PHYSICAL EXAMINATION: ECOG PERFORMANCE STATUS: 1 - Symptomatic but completely ambulatory  Vitals:   05/23/17 1441  BP: 138/86  Pulse: 100  Resp: 18  Temp: 97.6 F (36.4 C)  SpO2: 98%   Filed Weights   05/23/17 1441  Weight: 218 lb (98.9 kg)    GENERAL:alert, no distress and comfortable SKIN: skin color, texture, turgor are normal, no rashes or significant lesions   LABORATORY DATA:  I have reviewed the data as listed CMP Latest Ref Rng & Units 03/03/2017 01/30/2017 09/10/2016  Glucose 65 - 139 mg/dL 121 105(H) 112(H)  BUN 7 - 25 mg/dL 14 23(H) 14  Creatinine 0.50 - 0.99 mg/dL 0.89 0.62 0.76  Sodium 135 - 146 mmol/L 140 138 133(L)  Potassium 3.5 - 5.3 mmol/L 4.3 3.5 2.7(LL)  Chloride 98 - 110 mmol/L 102 104 100(L)  CO2 20 - 32 mmol/L 29 25 23   Calcium 8.6 - 10.4 mg/dL 10.0 9.2 9.2  Total Protein 6.1 - 8.1 g/dL 7.2 - 7.0  Total Bilirubin 0.2 - 1.2 mg/dL 1.5(H) - 1.8(H)  Alkaline Phos 38 - 126 U/L - - 83  AST 10 - 35 U/L 29 - 22  ALT 6 - 29 U/L 26 - 17   No results found for: GUR427   Lab Results  Component Value Date   WBC 5.7 05/07/2017   HGB 12.7 05/07/2017   HCT 37.4 05/07/2017   MCV 100.3 (H) 05/07/2017   PLT 211 05/07/2017   NEUTROABS 3.2 05/07/2017    ASSESSMENT & PLAN:  Macrocytic anemia 1.  Macrocytosis: Her anemia has resolved.  She still has some macrocytosis.  Her C62 and folic acid levels were within normal limits.  She is taking B12 injections twice a month.  Differential diagnosis for macrocytosis includes liver disease, hypothyroidism, MDS and drugs.  She has been on Imuran for the last 12 years.  She also takes Pentasa for the past 20+ years.  Imuran is known to cause macrocytosis and macrocytic anemia.  It is also known to increase the bilirubin levels.  This patient  also has elevated bilirubin for the past few times.  Her SPEP, LDH, and TSH levels were within normal limits.  Her abdominal ultrasound on 04/19/2017 showed increased hepatic echogenicity, likely representing steatosis.  This could also be contributing to her macrocytosis.  Her back in 6 months and repeat her CBC to follow-up on it.  2.  Chronic back/neck pain: She is taking OxyContin 10 mg twice a day.  She also takes oxycodone 15 mg 4 times a day for breakthrough pain.  This is well controlled.  Orders Placed This Encounter  Procedures  . CBC with Differential    Standing Status:   Future    Standing Expiration Date:   05/23/2018  . Lactate dehydrogenase    Standing Status:   Future    Standing Expiration Date:   05/23/2018  . Reticulocytes    Standing Status:   Future    Standing Expiration Date:   05/23/2018  . Comprehensive metabolic panel    Standing Status:   Future    Standing Expiration Date:   05/23/2018   The patient has a good understanding of the overall plan. she agrees with it. she will call with any problems that may develop before the next visit here.   Derek Jack, MD 05/23/17

## 2017-05-23 NOTE — Patient Instructions (Signed)
[place] at [facility]  Discharge Instructions:  You were seen by Dr. Delton Coombes today.   _______________________________________________________________  Thank you for choosing [place] at [facility] to provide your oncology and hematology care.  To afford each patient quality time with our providers, please arrive at least 15 minutes before your scheduled appointment.  You need to re-schedule your appointment if you arrive 10 or more minutes late.  We strive to give you quality time with our providers, and arriving late affects you and other patients whose appointments are after yours.  Also, if you no show three or more times for appointments you may be dismissed from the clinic.  Again, thank you for choosing [facility] [facility] [place] hope is that these requests will allow you access to exceptional care and in a timely manner. _______________________________________________________________  If you have questions after your visit, please contact our office at [phone] between the hours of 8:30 a.m. and 5:00 p.m. Voicemails left after 4:30 p.m. will not be returned until the following business day. _______________________________________________________________  For prescription refill requests, have your pharmacy contact our office. _______________________________________________________________  Recommendations made by the consultant and any test results will be sent to your referring physician. _______________________________________________________________

## 2017-05-23 NOTE — Assessment & Plan Note (Signed)
1.  Macrocytosis: Her anemia has resolved.  She still has some macrocytosis.  Her H40 and folic acid levels were within normal limits.  She is taking B12 injections twice a month.  Differential diagnosis for macrocytosis includes liver disease, hypothyroidism, MDS and drugs.  She has been on Imuran for the last 12 years.  She also takes Pentasa for the past 20+ years.  Imuran is known to cause macrocytosis and macrocytic anemia.  It is also known to increase the bilirubin levels.  This patient also has elevated bilirubin for the past few times.  Her SPEP, LDH, and TSH levels were within normal limits.  Her abdominal ultrasound on 04/19/2017 showed increased hepatic echogenicity, likely representing steatosis.  This could also be contributing to her macrocytosis.  Her back in 6 months and repeat her CBC to follow-up on it.  2.  Chronic back/neck pain: She is taking OxyContin 10 mg twice a day.  She also takes oxycodone 15 mg 4 times a day for breakthrough pain.  This is well controlled.

## 2017-06-05 ENCOUNTER — Ambulatory Visit (INDEPENDENT_AMBULATORY_CARE_PROVIDER_SITE_OTHER): Payer: Medicare Other | Admitting: Psychiatry

## 2017-06-05 ENCOUNTER — Encounter (HOSPITAL_COMMUNITY): Payer: Self-pay | Admitting: Psychiatry

## 2017-06-05 VITALS — BP 122/71 | HR 90 | Ht 67.0 in | Wt 223.0 lb

## 2017-06-05 DIAGNOSIS — Z560 Unemployment, unspecified: Secondary | ICD-10-CM

## 2017-06-05 DIAGNOSIS — F320 Major depressive disorder, single episode, mild: Secondary | ICD-10-CM | POA: Diagnosis not present

## 2017-06-05 DIAGNOSIS — Z736 Limitation of activities due to disability: Secondary | ICD-10-CM | POA: Diagnosis not present

## 2017-06-05 MED ORDER — ARIPIPRAZOLE 5 MG PO TABS: 5.0000 mg | ORAL_TABLET | Freq: Every day | ORAL | 3 refills | Status: DC

## 2017-06-05 MED ORDER — DULOXETINE HCL 30 MG PO CPEP: ORAL_CAPSULE | ORAL | 4 refills | Status: DC

## 2017-06-05 NOTE — Progress Notes (Signed)
Psychiatric Initial Adult Assessment   Patient Identification: Sheri [name] MRN:  [id number] Date of Evaluation:  06/05/2017 Referral Source: Dr.Karen Delice Lesch Chief Complaint:   Visit Diagnosis:  No diagnosis found.  History of Present Illness: Today the patient is seen with her daughter [name].For reasons that are not clear the patient is taking before. He feels more depressed every day. She's crying often. She still continues asleep any fairly well she doesn't really have that much anxiety family trip planned in a few weeks to go to the beach. Patient clearly feels much worse. She feels very sad. She is not psychotic. She certainly is not suicidal. She drinks no alcohol and uses no drugs.In a close surgery for psychosocial stressor, M.D. Brita Romp had no conflicts with anyone. Medically she is stable. Is very hard to find any explanation why she was getting better and then started getting worse. It is noted that she takes 120 mg of Cymbalta. She'll continue taking Neurontin. Today we'll go ahead and add 5 mg of Abilify. Patient was given the risks of Abilify and agreed to take them. Depression Symptoms:  depressed mood, (Hypo) Manic Symptoms:   Anxiety Symptoms:   Psychotic Symptoms:   PTSD Symptoms:   Past Psychiatric History: Celexa 40 mg   Previous Psychotropic Medications: Yes   Substance Abuse History in the last 12 months:  No.  Consequences of Substance Abuse:   Past Medical History:  Past Medical History:  Diagnosis Date  . Anemia   . Chronic back pain   . Chronic neck pain   . Complication of anesthesia   . Crohn disease ([place])   . Fibromyalgia   . GERD (gastroesophageal reflux disease)   . Hypertension   . Low back pain   . Osteopenia    Last DEXA 12/2010 was normal, on fosamax  . PONV (postoperative nausea and vomiting)   . Skin cancer    sees Dr. Tarri Glenn in [place]    Past Surgical History:  Procedure Laterality Date  . APPENDECTOMY  1990  . CATARACT EXTRACTION  W/PHACO Left 01/13/2014   Procedure: CATARACT EXTRACTION PHACO AND INTRAOCULAR LENS PLACEMENT LEFT EYE;  Surgeon: Tonny Branch, MD;  Location: [facility];  Service: Ophthalmology;  Laterality: Left;  CDE:6.90  . CATARACT EXTRACTION W/PHACO Right 01/24/2014   Procedure: CATARACT EXTRACTION PHACO AND INTRAOCULAR LENS PLACEMENT (IOC);  Surgeon: Tonny Branch, MD;  Location: [facility];  Service: Ophthalmology;  Laterality: Right;  CDE:5.91  . CESAREAN SECTION  [id number]  . COLONOSCOPY  09/11/2006  . COLONOSCOPY  10/30/2010   RMR: External and internal hemorrhoids, likely source of hematochezia  otherwise normal rectum/ Left-sided diverticula, status post prior right hemicolectomy with a friable, inflamed, stenotic surgical anastomosis with upstream dilation of the neoterminal ileum/ Crohn's noted, status post biopsy  . COLONOSCOPY N/A 06/23/2014   Dr. Rourk:friable eroded mucosa at the anastomosis with small bowel, consistent with some stenosis Crohn's disease s/p biopsy. Colonic diverticulosis and external hemorrhoids. Pathology with benign anastomotic mucosa, no dysplasia.   Marland Kitchen drainage of back abscess  2007  . ESOPHAGOGASTRODUODENOSCOPY  04/2009   noncritical schatzki's ring, small hh, sb bx negative  . ESOPHAGOGASTRODUODENOSCOPY (EGD) WITH PROPOFOL N/A 02/06/2017   Dr. Gala Romney: mild schatzki's ring at GE junction s/p dilation, small hiatal hernia, multiple 3 mm pedunculated and sessile polyps in gastric fundus, normal duodenum  . MALONEY DILATION N/A 02/06/2017   Procedure: Venia Minks DILATION;  Surgeon: Daneil Dolin, MD;  Location: [facility];  Service: Endoscopy;  Laterality:  N/A;  . POLYPECTOMY  02/06/2017   Procedure: POLYPECTOMY;  Surgeon: Daneil Dolin, MD;  Location: [facility];  Service: Endoscopy;;  gastric  . SMALL INTESTINE SURGERY  [id number]    Family Psychiatric History:   Family History:  Family History  Problem Relation Age of Onset  . Crohn's disease Mother        succumbed to stomach  cancer in her 2s  . Stomach cancer Mother   . Heart attack Father   . COPD Father   . Colon cancer Neg Hx     Social History:   Social History   Socioeconomic History  . Marital status: Legally Separated    Spouse name: Not on file  . Number of children: 2  . Years of education: 74  . Highest education level: 12th grade  Occupational History  . Occupation: disability    Employer: NOT EMPLOYED  Social Needs  . Financial resource strain: Not very hard  . Food insecurity:    Worry: Never true    Inability: Never true  . Transportation needs:    Medical: No    Non-medical: No  Tobacco Use  . Smoking status: Never Smoker  . Smokeless tobacco: Never Used  Substance and Sexual Activity  . Alcohol use: No  . Drug use: No  . Sexual activity: Yes    Birth control/protection: None  Lifestyle  . Physical activity:    Days per week: 0 days    Minutes per session: 0 min  . Stress: To some extent  Relationships  . Social connections:    Talks on phone: More than three times a week    Gets together: More than three times a week    Attends religious service: Never    Active member of club or organization: No    Attends meetings of clubs or organizations: Never    Relationship status: Separated  Other Topics Concern  . Not on file  Social History Narrative  . Not on file    Additional Social History:   Allergies:   Allergies  Allergen Reactions  . Codeine Sulfate Nausea Only    Metabolic Disorder Labs: No results found for: HGBA1C, MPG No results found for: PROLACTIN No results found for: CHOL, TRIG, HDL, CHOLHDL, VLDL, LDLCALC   Current Medications: Current Outpatient Medications  Medication Sig Dispense Refill  . acetaminophen (TYLENOL) 500 MG tablet Take 1,000 mg by mouth every 6 (six) hours as needed for moderate pain or headache.    . azaTHIOprine (IMURAN) 50 MG tablet TAKE 2 & 1/2 TABLETS DAILY. (Patient taking differently: Take 125 mg by mouth daily. ) 75  tablet 11  . azaTHIOprine (IMURAN) 50 MG tablet TAKE 2&1/2 TABLETS DAILY 75 tablet 5  . azaTHIOprine (IMURAN) 50 MG tablet TAKE 2&1/2 TABLETS DAILY 75 tablet 5  . Biotin 10 MG CAPS Take 20 mg by mouth daily.    . Calcium Carbonate-Vitamin D (CALCIUM 600+D PO) Take 1 capsule by mouth 3 (three) times daily.     . Cholecalciferol (EQL VITAMIN D3) 2000 units CAPS Take 2,000 Units by mouth daily.    . cyanocobalamin (,VITAMIN B-12,) 1000 MCG/ML injection Inject 1,000 mcg into the muscle every 14 (fourteen) days.    . cyclobenzaprine (FLEXERIL) 10 MG tablet Take 10 mg by mouth 3 (three) times daily.     Marland Kitchen dexlansoprazole (DEXILANT) 60 MG capsule Take 1 capsule (60 mg total) by mouth daily. 90 capsule 1  . DULoxetine (CYMBALTA) 30 MG  capsule 1  qam  For  1week then  2  qam 60 capsule 4  . ergocalciferol (VITAMIN D2) 50000 units capsule Take 50,000 Units by mouth every Sunday.    . gabapentin (NEURONTIN) 400 MG capsule 1  qam   2  qhs 90 capsule 4  . ketoconazole (NIZORAL) 2 % cream Apply 1 application topically daily as needed for irritation.    Javier Docker Oil 350 MG CAPS Take 350 mg by mouth daily.    Marland Kitchen lisinopril-hydrochlorothiazide (PRINZIDE,ZESTORETIC) 20-12.5 MG per tablet Take 1 tablet by mouth daily. 30 tablet 0  . Loratadine 10 MG CAPS Take 10 mg by mouth daily.     . nortriptyline (PAMELOR) 25 MG capsule 2  qhs (Patient taking differently: Take 75 mg by mouth at bedtime. ) 60 capsule 4  . Omega-3 Fatty Acids (FISH OIL PO) Take 1 capsule by mouth at bedtime.    Marland Kitchen oxyCODONE (OXYCONTIN) 10 mg 12 hr tablet Take 10 mg by mouth every 12 (twelve) hours.    Marland Kitchen oxyCODONE (ROXICODONE) 15 MG immediate release tablet Take 15 mg by mouth every 6 (six) hours as needed for pain.     Marland Kitchen PENTASA 500 MG CR capsule TAKE (2) CAPSULES FOUR TIMES DAILY. (Patient taking differently: Take 1500 mg by mouth in the morning, take 1000 mg by mouth in the afternoon and take 1500 mg by mouth at bedtime) 240 capsule 5  .  temazepam (RESTORIL) 15 MG capsule Take 2 capsules (30 mg total) by mouth at bedtime. 60 capsule 4  . VITAMIN E PO Take 1 Units by mouth 2 (two) times daily.     Marland Kitchen zinc gluconate 50 MG tablet Take 50 mg by mouth daily.      . ARIPiprazole (ABILIFY) 5 MG tablet Take 1 tablet (5 mg total) by mouth daily. 30 tablet 3   No current facility-administered medications for this visit.     Neurologic: Headache:  Seizure: No Paresthesias:No  Musculoskeletal: Strength & Muscle Tone: within normal limits Gait & Station: normal Patient leans: Right  Psychiatric Specialty Exam: ROS  Blood pressure 122/71, pulse 90, height 5' 7"  (1.702 m), weight 223 lb (101.2 kg), SpO2 98 %.Body mass index is 34.93 kg/m.  General Appearance: Casual  Eye Contact:  Fair  Speech:  Negative  Volume:  Normal  Mood:  NA  Affect:  Congruent  Thought Process:  Coherent  Orientation:  NA  Thought Content:  WDL  Suicidal Thoughts:  No  Homicidal Thoughts:  No  Memory:  Negative  Judgement:  Good  Insight:  Present  Psychomotor Activity:  Normal  Concentration:    Recall:    Fund of Knowledge:Good  Language: Fair  Akathisia:  No  Handed:  Right  AIMS (if indicated):    Assets:    ADL's:    Cognition:   nt Plan Summary: 4/25/20192:10 PM  Today the patient is not doing well. She clearly is declining. We will begin her on Abilify 5 mg. The patient is not psychotic. She certainly is not suicidal. She'll return to see me in 5 weeks and we'll reevaluate her condition.

## 2017-07-06 ENCOUNTER — Other Ambulatory Visit: Payer: Self-pay | Admitting: Gastroenterology

## 2017-07-08 ENCOUNTER — Ambulatory Visit (HOSPITAL_COMMUNITY): Payer: Self-pay | Admitting: Psychiatry

## 2017-07-08 ENCOUNTER — Encounter (HOSPITAL_COMMUNITY): Payer: Self-pay | Admitting: Psychiatry

## 2017-07-08 ENCOUNTER — Ambulatory Visit (INDEPENDENT_AMBULATORY_CARE_PROVIDER_SITE_OTHER): Payer: Medicare Other | Admitting: Psychiatry

## 2017-07-08 VITALS — BP 155/81 | HR 97 | Ht 67.0 in | Wt 219.0 lb

## 2017-07-08 DIAGNOSIS — F324 Major depressive disorder, single episode, in partial remission: Secondary | ICD-10-CM

## 2017-07-08 MED ORDER — DULOXETINE HCL 30 MG PO CPEP: ORAL_CAPSULE | ORAL | 4 refills | Status: DC

## 2017-07-08 MED ORDER — ARIPIPRAZOLE 5 MG PO TABS: 5.0000 mg | ORAL_TABLET | Freq: Every day | ORAL | 3 refills | Status: DC

## 2017-07-08 MED ORDER — GABAPENTIN 400 MG PO CAPS: ORAL_CAPSULE | ORAL | 4 refills | Status: DC

## 2017-07-08 NOTE — Progress Notes (Signed)
Psychiatric Initial Adult Assessment   Patient Identification: Sheri [name] MRN:  [id number] Date of Evaluation:  07/08/2017 Referral Source: Dr.Karen Delice Lesch Chief Complaint:   Visit Diagnosis:  No diagnosis found.  History of Present Illness: Today the patient is seen with her daughter Sheri [name]. Unfortunately the patient's dog died 24 hours ago. The patient is obviously grieving. She's had a dog for a little bit more than a year but fairly close to. Approximately a week ago if she would have an appointment with me she was actually doing well. She was stable. Her mood still had some ups and downs but for the most part 5 mg of Abilify together with his Cymbalta was working well. She denies daily persistent depression. She really denies anhedonia. She seems to enjoy things. She certainly enjoys her familyshe enjoys going on vacation current. Patient says she sleeping and eating well. Her pain is generally well controlled except for her right shoulder is bothered. The patient has a number of medical problems including skin cancer. Overall though she feels like she is in a better place other than the last 24 hours. She clearly is in grieving. Unfortunately this patient is never engaged in psychotherapy. She's never taken the opportunity.Today went over the fiber things that she can do to help deal with depression. This includes exercising which she doesn't being creative she does rarely being spiritually activeand remaining very social. I strongly encouraged her to get up and get dressed get out of the house every day. To stay active. Today we went over the side effects ofpotential of Abilify. We talked a lot about tardive dyskinesia. At this time she had an aims scale and demonstrated no evidence of TD. Today the patient was informed of the risks and benefits of Abilify. In general she says it has been helpful. (Hypo) Manic Symptoms:   Anxiety Symptoms:   Psychotic Symptoms:   PTSD Symptoms:   Past  Psychiatric History: Celexa 40 mg   Previous Psychotropic Medications: Yes   Substance Abuse History in the last 12 months:  No.  Consequences of Substance Abuse:   Past Medical History:  Past Medical History:  Diagnosis Date  . Anemia   . Chronic back pain   . Chronic neck pain   . Complication of anesthesia   . Crohn disease ([place])   . Fibromyalgia   . GERD (gastroesophageal reflux disease)   . Hypertension   . Low back pain   . Osteopenia    Last DEXA 12/2010 was normal, on fosamax  . PONV (postoperative nausea and vomiting)   . Skin cancer    sees Dr. Tarri Glenn in [place]    Past Surgical History:  Procedure Laterality Date  . APPENDECTOMY  1990  . CATARACT EXTRACTION W/PHACO Left 01/13/2014   Procedure: CATARACT EXTRACTION PHACO AND INTRAOCULAR LENS PLACEMENT LEFT EYE;  Surgeon: Tonny Branch, MD;  Location: [facility];  Service: Ophthalmology;  Laterality: Left;  CDE:6.90  . CATARACT EXTRACTION W/PHACO Right 01/24/2014   Procedure: CATARACT EXTRACTION PHACO AND INTRAOCULAR LENS PLACEMENT (IOC);  Surgeon: Tonny Branch, MD;  Location: [facility];  Service: Ophthalmology;  Laterality: Right;  CDE:5.91  . CESAREAN SECTION  [id number]  . COLONOSCOPY  09/11/2006  . COLONOSCOPY  10/30/2010   RMR: External and internal hemorrhoids, likely source of hematochezia  otherwise normal rectum/ Left-sided diverticula, status post prior right hemicolectomy with a friable, inflamed, stenotic surgical anastomosis with upstream dilation of the neoterminal ileum/ Crohn's noted, status post biopsy  . COLONOSCOPY N/A  06/23/2014   Dr. Rourk:friable eroded mucosa at the anastomosis with small bowel, consistent with some stenosis Crohn's disease s/p biopsy. Colonic diverticulosis and external hemorrhoids. Pathology with benign anastomotic mucosa, no dysplasia.   Marland Kitchen drainage of back abscess  2007  . ESOPHAGOGASTRODUODENOSCOPY  04/2009   noncritical schatzki's ring, small hh, sb bx negative  . ESOPHAGOGASTRODUODENOSCOPY  (EGD) WITH PROPOFOL N/A 02/06/2017   Dr. Gala Romney: mild schatzki's ring at GE junction s/p dilation, small hiatal hernia, multiple 3 mm pedunculated and sessile polyps in gastric fundus, normal duodenum  . MALONEY DILATION N/A 02/06/2017   Procedure: Venia Minks DILATION;  Surgeon: Daneil Dolin, MD;  Location: [facility];  Service: Endoscopy;  Laterality: N/A;  . POLYPECTOMY  02/06/2017   Procedure: POLYPECTOMY;  Surgeon: Daneil Dolin, MD;  Location: [facility];  Service: Endoscopy;;  gastric  . SMALL INTESTINE SURGERY  [id number]    Family Psychiatric History:   Family History:  Family History  Problem Relation Age of Onset  . Crohn's disease Mother        succumbed to stomach cancer in her 9s  . Stomach cancer Mother   . Heart attack Father   . COPD Father   . Colon cancer Neg Hx     Social History:   Social History   Socioeconomic History  . Marital status: Legally Separated    Spouse name: Not on file  . Number of children: 2  . Years of education: 99  . Highest education level: 12th grade  Occupational History  . Occupation: disability    Employer: NOT EMPLOYED  Social Needs  . Financial resource strain: Not very hard  . Food insecurity:    Worry: Never true    Inability: Never true  . Transportation needs:    Medical: No    Non-medical: No  Tobacco Use  . Smoking status: Never Smoker  . Smokeless tobacco: Never Used  Substance and Sexual Activity  . Alcohol use: No  . Drug use: No  . Sexual activity: Yes    Birth control/protection: None  Lifestyle  . Physical activity:    Days per week: 0 days    Minutes per session: 0 min  . Stress: To some extent  Relationships  . Social connections:    Talks on phone: More than three times a week    Gets together: More than three times a week    Attends religious service: Never    Active member of club or organization: No    Attends meetings of clubs or organizations: Never    Relationship status: Separated   Other Topics Concern  . Not on file  Social History Narrative  . Not on file    Additional Social History:   Allergies:   Allergies  Allergen Reactions  . Codeine Sulfate Nausea Only    Metabolic Disorder Labs: No results found for: HGBA1C, MPG No results found for: PROLACTIN No results found for: CHOL, TRIG, HDL, CHOLHDL, VLDL, LDLCALC   Current Medications: Current Outpatient Medications  Medication Sig Dispense Refill  . acetaminophen (TYLENOL) 500 MG tablet Take 1,000 mg by mouth every 6 (six) hours as needed for moderate pain or headache.    . ARIPiprazole (ABILIFY) 5 MG tablet Take 1 tablet (5 mg total) by mouth daily. 30 tablet 3  . azaTHIOprine (IMURAN) 50 MG tablet TAKE 2 & 1/2 TABLETS DAILY. (Patient taking differently: Take 125 mg by mouth daily. ) 75 tablet 11  . azaTHIOprine (IMURAN) 50  MG tablet TAKE 2&1/2 TABLETS DAILY 75 tablet 5  . azaTHIOprine (IMURAN) 50 MG tablet TAKE 2&1/2 TABLETS DAILY 75 tablet 5  . Biotin 10 MG CAPS Take 20 mg by mouth daily.    . Calcium Carbonate-Vitamin D (CALCIUM 600+D PO) Take 1 capsule by mouth 3 (three) times daily.     . Cholecalciferol (EQL VITAMIN D3) 2000 units CAPS Take 2,000 Units by mouth daily.    . cyanocobalamin (,VITAMIN B-12,) 1000 MCG/ML injection Inject 1,000 mcg into the muscle every 14 (fourteen) days.    . cyclobenzaprine (FLEXERIL) 10 MG tablet Take 10 mg by mouth 3 (three) times daily.     Marland Kitchen dexlansoprazole (DEXILANT) 60 MG capsule Take 1 capsule (60 mg total) by mouth daily. 90 capsule 1  . DULoxetine (CYMBALTA) 30 MG capsule 1  qam  For  1week then  2  qam 60 capsule 4  . ergocalciferol (VITAMIN D2) 50000 units capsule Take 50,000 Units by mouth every Sunday.    . gabapentin (NEURONTIN) 400 MG capsule 1  qam   2  qhs 90 capsule 4  . ketoconazole (NIZORAL) 2 % cream Apply 1 application topically daily as needed for irritation.    Javier Docker Oil 350 MG CAPS Take 350 mg by mouth daily.    Marland Kitchen  lisinopril-hydrochlorothiazide (PRINZIDE,ZESTORETIC) 20-12.5 MG per tablet Take 1 tablet by mouth daily. 30 tablet 0  . Loratadine 10 MG CAPS Take 10 mg by mouth daily.     . nortriptyline (PAMELOR) 25 MG capsule 2  qhs (Patient taking differently: Take 75 mg by mouth at bedtime. ) 60 capsule 4  . Omega-3 Fatty Acids (FISH OIL PO) Take 1 capsule by mouth at bedtime.    Marland Kitchen oxyCODONE (OXYCONTIN) 10 mg 12 hr tablet Take 10 mg by mouth every 12 (twelve) hours.    Marland Kitchen oxyCODONE (ROXICODONE) 15 MG immediate release tablet Take 15 mg by mouth every 6 (six) hours as needed for pain.     Marland Kitchen temazepam (RESTORIL) 15 MG capsule Take 2 capsules (30 mg total) by mouth at bedtime. 60 capsule 4  . VITAMIN E PO Take 1 Units by mouth 2 (two) times daily.     Marland Kitchen zinc gluconate 50 MG tablet Take 50 mg by mouth daily.      Marland Kitchen PENTASA 500 MG CR capsule TAKE (2) CAPSULES FOUR TIMES DAILY. 240 capsule 5   No current facility-administered medications for this visit.     Neurologic: Headache:  Seizure: No Paresthesias:No  Musculoskeletal: Strength & Muscle Tone: within normal limits Gait & Station: normal Patient leans: Right  Psychiatric Specialty Exam: ROS  Blood pressure (!) 155/81, pulse 97, height 5' 7"  (1.702 m), weight 219 lb (99.3 kg), SpO2 97 %.Body mass index is 34.3 kg/m.  General Appearance: Casual  Eye Contact:  Fair  Speech:  Negative  Volume:  Normal  Mood:  NA  Affect:  Congruent  Thought Process:  Coherent  Orientation:  NA  Thought Content:  WDL  Suicidal Thoughts:  No  Homicidal Thoughts:  No  Memory:  Negative  Judgement:  Good  Insight:  Present  Psychomotor Activity:  Normal  Concentration:    Recall:    Fund of Knowledge:Good  Language: Fair  Akathisia:  No  Handed:  Right  AIMS (if indicated):    Assets:    ADL's:    Cognition:   nt Plan Summary: 5/28/20194:21 PM  At this timeI believe is no indication to change any of  her medicines. She'll come back in 6 weeks and at  that time we'll probably start reducing and discontinuing her Abilify. She'll continue taking Neurontin 400 mg 1 in the morning and 2 at night. She says it is very helpful for her. The patient, reduce her pain and reduce his anxiety. We'll clarify that she's actually taking only 120 mg of his father. She'll continue taking Abilify 5 mg for now. Generally she is doing well. She'll return to see me in 6 weeks. Again I strongly recommended that she considers recontacting her therapist and start back into therapy.

## 2017-07-27 ENCOUNTER — Other Ambulatory Visit: Payer: Self-pay | Admitting: Nurse Practitioner

## 2017-07-27 DIAGNOSIS — K219 Gastro-esophageal reflux disease without esophagitis: Secondary | ICD-10-CM

## 2017-08-02 ENCOUNTER — Other Ambulatory Visit (HOSPITAL_COMMUNITY): Payer: Self-pay | Admitting: Psychiatry

## 2017-08-13 ENCOUNTER — Telehealth: Payer: Self-pay | Admitting: *Deleted

## 2017-08-13 NOTE — Telephone Encounter (Signed)
Patient called in stating the pharmacy stated they faxed [place] a PA for her pentasa. She is almost out.

## 2017-08-19 ENCOUNTER — Telehealth: Payer: Self-pay | Admitting: Internal Medicine

## 2017-08-19 NOTE — Telephone Encounter (Signed)
Spoke with pt. Pt is aware that I'm calling her insurance company today. I submitted a PA 1 week ago and haven't received an update.   Called pts insurance plan 3 times and haven't been able to get a rep. Will try again.

## 2017-08-19 NOTE — Telephone Encounter (Signed)
See documentation in other note.

## 2017-08-19 NOTE — Telephone Encounter (Signed)
Patient called and said her pharmacy does not have her pentaza prescription,  Stated the pharmacy needed a prior auth and that was sent here last week, she is almost out of her meds and she is taking less than prescribed because she is almost out.

## 2017-08-20 ENCOUNTER — Other Ambulatory Visit (HOSPITAL_COMMUNITY): Payer: Self-pay | Admitting: Anesthesiology

## 2017-08-20 DIAGNOSIS — M5412 Radiculopathy, cervical region: Secondary | ICD-10-CM

## 2017-08-20 NOTE — Telephone Encounter (Signed)
Lmom, waiting on a call back

## 2017-08-20 NOTE — Telephone Encounter (Signed)
Spoke with Medicaid rep Maggie and medication isn't covered through them, medication would have to get approved through Medicare. I've been trying to call medicare for 2 days. Will try again.

## 2017-08-21 NOTE — Telephone Encounter (Signed)
Lmom, waiting on a return call.  

## 2017-08-22 ENCOUNTER — Telehealth: Payer: Self-pay | Admitting: Internal Medicine

## 2017-08-22 NOTE — Telephone Encounter (Signed)
Spoke with pt and she is aware that I haven't been able to reach medicare. Will continue to work on [place]

## 2017-08-22 NOTE — Telephone Encounter (Signed)
Pt was returning a call from AM. Please call her back at [phone]

## 2017-08-26 ENCOUNTER — Other Ambulatory Visit (HOSPITAL_COMMUNITY): Payer: Self-pay | Admitting: Internal Medicine

## 2017-08-26 ENCOUNTER — Ambulatory Visit (HOSPITAL_COMMUNITY): Payer: Medicare Other

## 2017-08-26 DIAGNOSIS — Z1231 Encounter for screening mammogram for malignant neoplasm of breast: Secondary | ICD-10-CM

## 2017-08-26 NOTE — Telephone Encounter (Signed)
Returned pts call. Still working on [place].

## 2017-09-01 ENCOUNTER — Ambulatory Visit: Payer: Self-pay | Admitting: Nurse Practitioner

## 2017-09-01 ENCOUNTER — Ambulatory Visit: Payer: Self-pay | Admitting: Gastroenterology

## 2017-09-02 ENCOUNTER — Ambulatory Visit: Payer: Self-pay | Admitting: Neurology

## 2017-09-02 DIAGNOSIS — R350 Frequency of micturition: Secondary | ICD-10-CM | POA: Insufficient documentation

## 2017-09-02 DIAGNOSIS — E7800 Pure hypercholesterolemia, unspecified: Secondary | ICD-10-CM | POA: Insufficient documentation

## 2017-09-02 DIAGNOSIS — Z2089 Contact with and (suspected) exposure to other communicable diseases: Secondary | ICD-10-CM | POA: Insufficient documentation

## 2017-09-02 DIAGNOSIS — E669 Obesity, unspecified: Secondary | ICD-10-CM | POA: Insufficient documentation

## 2017-09-02 DIAGNOSIS — Z87891 Personal history of nicotine dependence: Secondary | ICD-10-CM | POA: Insufficient documentation

## 2017-09-02 DIAGNOSIS — R0989 Other specified symptoms and signs involving the circulatory and respiratory systems: Secondary | ICD-10-CM | POA: Insufficient documentation

## 2017-09-02 DIAGNOSIS — E538 Deficiency of other specified B group vitamins: Secondary | ICD-10-CM | POA: Insufficient documentation

## 2017-09-02 DIAGNOSIS — E530 Riboflavin deficiency: Secondary | ICD-10-CM | POA: Insufficient documentation

## 2017-09-02 DIAGNOSIS — F419 Anxiety disorder, unspecified: Secondary | ICD-10-CM | POA: Insufficient documentation

## 2017-09-02 DIAGNOSIS — E8940 Asymptomatic postprocedural ovarian failure: Secondary | ICD-10-CM | POA: Insufficient documentation

## 2017-09-02 DIAGNOSIS — E559 Vitamin D deficiency, unspecified: Secondary | ICD-10-CM | POA: Insufficient documentation

## 2017-09-02 DIAGNOSIS — L858 Other specified epidermal thickening: Secondary | ICD-10-CM | POA: Insufficient documentation

## 2017-09-02 DIAGNOSIS — M25561 Pain in right knee: Secondary | ICD-10-CM | POA: Insufficient documentation

## 2017-09-02 DIAGNOSIS — B86 Scabies: Secondary | ICD-10-CM | POA: Insufficient documentation

## 2017-09-02 DIAGNOSIS — F0390 Unspecified dementia without behavioral disturbance: Secondary | ICD-10-CM | POA: Insufficient documentation

## 2017-09-02 DIAGNOSIS — L570 Actinic keratosis: Secondary | ICD-10-CM | POA: Insufficient documentation

## 2017-09-02 DIAGNOSIS — M67431 Ganglion, right wrist: Secondary | ICD-10-CM | POA: Insufficient documentation

## 2017-09-02 DIAGNOSIS — R413 Other amnesia: Secondary | ICD-10-CM | POA: Insufficient documentation

## 2017-09-02 DIAGNOSIS — Z207 Contact with and (suspected) exposure to pediculosis, acariasis and other infestations: Secondary | ICD-10-CM | POA: Insufficient documentation

## 2017-09-03 ENCOUNTER — Encounter (HOSPITAL_COMMUNITY): Payer: Self-pay

## 2017-09-03 ENCOUNTER — Ambulatory Visit (HOSPITAL_COMMUNITY)
Admission: RE | Admit: 2017-09-03 | Discharge: 2017-09-03 | Disposition: A | Discharge status: Home / Self Care | Payer: Medicare Other | Source: Ambulatory Visit | Attending: Anesthesiology | Admitting: Anesthesiology

## 2017-09-03 ENCOUNTER — Ambulatory Visit (HOSPITAL_COMMUNITY)
Admission: RE | Admit: 2017-09-03 | Discharge: 2017-09-03 | Disposition: A | Discharge status: Home / Self Care | Payer: Medicare Other | Source: Ambulatory Visit | Attending: Internal Medicine | Admitting: Internal Medicine

## 2017-09-03 DIAGNOSIS — Z1231 Encounter for screening mammogram for malignant neoplasm of breast: Secondary | ICD-10-CM | POA: Diagnosis present

## 2017-09-03 DIAGNOSIS — M4804 Spinal stenosis, thoracic region: Secondary | ICD-10-CM | POA: Insufficient documentation

## 2017-09-03 DIAGNOSIS — M2460 Ankylosis, unspecified joint: Secondary | ICD-10-CM | POA: Diagnosis not present

## 2017-09-03 DIAGNOSIS — M5134 Other intervertebral disc degeneration, thoracic region: Secondary | ICD-10-CM | POA: Diagnosis not present

## 2017-09-03 DIAGNOSIS — M4314 Spondylolisthesis, thoracic region: Secondary | ICD-10-CM | POA: Insufficient documentation

## 2017-09-03 DIAGNOSIS — M5412 Radiculopathy, cervical region: Secondary | ICD-10-CM | POA: Diagnosis not present

## 2017-09-03 DIAGNOSIS — M4802 Spinal stenosis, cervical region: Secondary | ICD-10-CM | POA: Insufficient documentation

## 2017-09-03 NOTE — Telephone Encounter (Signed)
Tried calling medicare in reference to a PA. When transferred to another rep, rep stated pt will have to call medicare and ask them who their part D medication carrier is. Medicare part B doesn't cover medications. Lmom, waiting on a return call.

## 2017-09-03 NOTE — Telephone Encounter (Signed)
Pt left a VM around. Returned call, pt didn't answer the phone. VM left, waiting on a return call.

## 2017-09-04 NOTE — Telephone Encounter (Signed)
Spoke with pt. Pt was asked to call the number on her medicare card and ask who her medication part D prescription coverage is. They will only give info to pt. I tried calling and was told medicare part B doesn't cover medications. Waiting on a return call from pt.

## 2017-09-08 NOTE — Telephone Encounter (Signed)
Pt called back with info on RX coverage. [employer], [phone], RX BIN [id number], ID [id number], PCN [id number].   Pentasa has been approved through 08/2018. Pt and pharmacy were notified of approval.

## 2017-09-09 ENCOUNTER — Telehealth: Payer: Self-pay | Admitting: Internal Medicine

## 2017-09-09 ENCOUNTER — Ambulatory Visit (INDEPENDENT_AMBULATORY_CARE_PROVIDER_SITE_OTHER): Payer: Medicare Other | Admitting: Gastroenterology

## 2017-09-09 VITALS — BP 117/75 | HR 105 | Temp 98.1°F | Ht 68.0 in | Wt 225.8 lb

## 2017-09-09 DIAGNOSIS — K5080 Crohn's disease of both small and large intestine without complications: Secondary | ICD-10-CM

## 2017-09-09 NOTE — Patient Instructions (Signed)
I will see you in 6 months!  We will hold on blood work now as you are having this done with Hematology soon.  You will be due for the bone density scan in December this year.  I am glad things are going well!  It was a pleasure to see you today. I strive to create trusting relationships with patients to provide genuine, compassionate, and quality care. I value your feedback. If you receive a survey regarding your visit,  I greatly appreciate you taking time to fill this out.   Annitta Needs, PhD, ANP-BC [facility]

## 2017-09-09 NOTE — Telephone Encounter (Signed)
Patient came in right before the 15 minute leeway and told me "The huffing and puffing I did did not get her here any faster did it"  I explained to her that there was a 15 minute leeway and I had to quickly get her checked in because if she is not checked in by 1:45 she would have to be rescheduled and she said "She knew the rules and, rules were rules"

## 2017-09-09 NOTE — Telephone Encounter (Signed)
Reviewed

## 2017-09-09 NOTE — Telephone Encounter (Signed)
When patient left she was very nice and positive and said she would take a later appointment for her next appointment.

## 2017-09-09 NOTE — Progress Notes (Signed)
Primary Care Physician:  Monico Blitz, MD  Primary GI: Dr. Gala Romney    Chief Complaint  Patient presents with  . Crohn's Disease    HPI:   Sheri [name] is a 67 y.o. 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 Due to rectal bleeding in May 2016, underwent updated colonoscopy. Notedfriable eroded mucosa at the anastomosis with small bowel, consistent with some stenosis Crohn's disease s/p biopsy. Colonic diverticulosis and external hemorrhoids. Pathology with benign anastomotic mucosa, no dysplasia.   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 Differentials including liver disease, hypothyroidism, MDS, drug effect. Has been on Imuran chronically and Pentasa. SPEP, LDH, TSH in normal range. Needs DEXA scan in Dec 2019.   Doing well from a GI standpoint. No rectal bleeding. Occasional loose stool but quickly resolves. No abdominal pain. Feels better from a depression standpoint.   Past Medical History:  Diagnosis Date  . Anemia   . Chronic back pain   . Chronic neck pain   . Complication of anesthesia   . Crohn disease ([place])   . Fibromyalgia   . GERD (gastroesophageal reflux disease)   . Hypertension   . Low back pain   . Osteopenia    Last DEXA 12/2010 was normal, on fosamax  . PONV (postoperative nausea and vomiting)   . Skin cancer    sees Dr. Tarri Glenn in [place]    Past Surgical History:  Procedure Laterality Date  . APPENDECTOMY  1990  . BREAST EXCISIONAL BIOPSY Right   . CATARACT EXTRACTION W/PHACO Left 01/13/2014   Procedure: CATARACT EXTRACTION PHACO AND INTRAOCULAR LENS PLACEMENT LEFT EYE;  Surgeon: Tonny Branch, MD;  Location: [facility];  Service: Ophthalmology;  Laterality: Left;  CDE:6.90  . CATARACT EXTRACTION W/PHACO Right 01/24/2014   Procedure: CATARACT EXTRACTION PHACO AND INTRAOCULAR LENS PLACEMENT (IOC);  Surgeon: Tonny Branch, MD;  Location: [facility];  Service: Ophthalmology;  Laterality: Right;  CDE:5.91  .  CESAREAN SECTION  [id number]  . COLONOSCOPY  09/11/2006  . COLONOSCOPY  10/30/2010   RMR: External and internal hemorrhoids, likely source of hematochezia  otherwise normal rectum/ Left-sided diverticula, status post prior right hemicolectomy with a friable, inflamed, stenotic surgical anastomosis with upstream dilation of the neoterminal ileum/ Crohn's noted, status post biopsy  . COLONOSCOPY N/A 06/23/2014   Dr. Rourk:friable eroded mucosa at the anastomosis with small bowel, consistent with some stenosis Crohn's disease s/p biopsy. Colonic diverticulosis and external hemorrhoids. Pathology with benign anastomotic mucosa, no dysplasia.   Marland Kitchen drainage of back abscess  2007  . ESOPHAGOGASTRODUODENOSCOPY  04/2009   noncritical schatzki's ring, small hh, sb bx negative  . ESOPHAGOGASTRODUODENOSCOPY (EGD) WITH PROPOFOL N/A 02/06/2017   Dr. Gala Romney: mild schatzki's ring at GE junction s/p dilation, small hiatal hernia, multiple 3 mm pedunculated and sessile polyps in gastric fundus, normal duodenum  . MALONEY DILATION N/A 02/06/2017   Procedure: Venia Minks DILATION;  Surgeon: Daneil Dolin, MD;  [facility];  Service: Endoscopy;  Laterality: N/A;  . POLYPECTOMY  02/06/2017   Procedure: POLYPECTOMY;  Surgeon: Daneil Dolin, MD;  [facility];  Service: Endoscopy;;  gastric  . SMALL INTESTINE SURGERY  [id number]    Current Outpatient Medications  Medication Sig Dispense Refill  . acetaminophen (TYLENOL) 500 MG tablet Take 1,000 mg by mouth every 6 (six) hours as needed for moderate pain or headache.    . ARIPiprazole (ABILIFY) 5 MG tablet Take 1 tablet (5 mg  total) by mouth daily. 30 tablet 3  . azaTHIOprine (IMURAN) 50 MG tablet TAKE 2 & 1/2 TABLETS DAILY. (Patient taking differently: Take 125 mg by mouth daily. ) 75 tablet 11  . azaTHIOprine (IMURAN) 50 MG tablet TAKE 2&1/2 TABLETS DAILY 75 tablet 5  . Biotin 10 MG CAPS Take 20 mg by mouth daily.    . Calcium Carbonate-Vitamin D  (CALCIUM 600+D PO) Take 1 capsule by mouth 3 (three) times daily.     . Cholecalciferol (EQL VITAMIN D3) 2000 units CAPS Take 2,000 Units by mouth daily.    . cyanocobalamin (,VITAMIN B-12,) 1000 MCG/ML injection Inject 1,000 mcg into the muscle every 14 (fourteen) days.    . cyclobenzaprine (FLEXERIL) 10 MG tablet Take 10 mg by mouth 3 (three) times daily.     Marland Kitchen DEXILANT 60 MG capsule TAKE 1 CAPSULE BY MOUTH ONCE DAILY. 30 capsule 3  . DULoxetine (CYMBALTA) 30 MG capsule 1  qam  For  1week then  2  qam 60 capsule 4  . ergocalciferol (VITAMIN D2) 50000 units capsule Take 50,000 Units by mouth every Sunday.    . gabapentin (NEURONTIN) 400 MG capsule 1  qam   2  qhs 90 capsule 4  . ketoconazole (NIZORAL) 2 % cream Apply 1 application topically daily as needed for irritation.    Javier Docker Oil 350 MG CAPS Take 350 mg by mouth daily.    Marland Kitchen lisinopril-hydrochlorothiazide (PRINZIDE,ZESTORETIC) 20-12.5 MG per tablet Take 1 tablet by mouth daily. 30 tablet 0  . nortriptyline (PAMELOR) 25 MG capsule 2  qhs (Patient taking differently: Take 75 mg by mouth at bedtime. ) 60 capsule 4  . Omega-3 Fatty Acids (FISH OIL PO) Take 1 capsule by mouth at bedtime.    Marland Kitchen oxyCODONE (ROXICODONE) 15 MG immediate release tablet Take 15 mg by mouth every 6 (six) hours as needed for pain.     Marland Kitchen PENTASA 500 MG CR capsule TAKE (2) CAPSULES FOUR TIMES DAILY. 240 capsule 5  . temazepam (RESTORIL) 15 MG capsule Take 2 capsules (30 mg total) by mouth at bedtime. 60 capsule 4  . VITAMIN E PO Take 1 Units by mouth 2 (two) times daily.     Marland Kitchen zinc gluconate 50 MG tablet Take 50 mg by mouth daily.       No current facility-administered medications for this visit.     Allergies as of 09/09/2017 - Review Complete 09/09/2017  Allergen Reaction Noted  . Codeine sulfate Nausea Only     Family History  Problem Relation Age of Onset  . Crohn's disease Mother        succumbed to stomach cancer in her 62s  . Stomach cancer Mother   .  Heart attack Father   . COPD Father   . Colon cancer Neg Hx     Social History   Socioeconomic History  . Marital status: Legally Separated    Spouse name: Not on file  . Number of children: 2  . Years of education: 98  . Highest education level: 12th grade  Occupational History  . Occupation: disability    Employer: NOT EMPLOYED  Social Needs  . Financial resource strain: Not very hard  . Food insecurity:    Worry: Never true    Inability: Never true  . Transportation needs:    Medical: No    Non-medical: No  Tobacco Use  . Smoking status: Never Smoker  . Smokeless tobacco: Never Used  Substance and Sexual Activity  .  Alcohol use: No  . Drug use: No  . Sexual activity: Yes    Birth control/protection: None  Lifestyle  . Physical activity:    Days per week: 0 days    Minutes per session: 0 min  . Stress: To some extent  Relationships  . Social connections:    Talks on phone: More than three times a week    Gets together: More than three times a week    Attends religious service: Never    Active member of club or organization: No    Attends meetings of clubs or organizations: Never    Relationship status: Separated  Other Topics Concern  . Not on file  Social History Narrative  . Not on file    Review of Systems: Gen: Denies fever, chills, anorexia. Denies fatigue, weakness, weight loss.  CV: Denies chest pain, palpitations, syncope, peripheral edema, and claudication. Resp: Denies dyspnea at rest, cough, wheezing, coughing up blood, and pleurisy. GI: see HPI  Derm: Denies rash, itching, dry skin Psych: Denies depression, anxiety, memory loss, confusion. No homicidal or suicidal ideation.  Heme: Denies bruising, bleeding, and enlarged lymph nodes.  Physical Exam: BP 117/75   Pulse (!) 105   Temp 98.1 F (36.7 C) (Oral)   Ht 5' 8"  (1.727 m)   Wt 225 lb 12.8 oz (102.4 kg)   BMI 34.33 kg/m  General:   Alert and oriented. No distress noted. Pleasant and  cooperative.  Head:  Normocephalic and atraumatic. Eyes:  Conjuctiva clear without scleral icterus. Mouth:  Oral mucosa pink and moist.  Abdomen:  +BS, soft, non-tender and non-distended. No rebound or guarding. No HSM or masses noted. Msk:  Symmetrical without gross deformities. Normal posture. Extremities:  Without edema. Neurologic:  Alert and  oriented x4 Psych:  Alert and cooperative. Normal mood and affect.  Lab Results  Component Value Date   WBC 5.7 05/07/2017   HGB 12.7 05/07/2017   HCT 37.4 05/07/2017   MCV 100.3 (H) 05/07/2017   PLT 211 05/07/2017   Lab Results  Component Value Date   ALT 26 03/03/2017   AST 29 03/03/2017   ALKPHOS 83 09/10/2016   BILITOT 1.5 (H) 03/03/2017   Lab Results  Component Value Date   CREATININE 0.89 03/03/2017   BUN 14 03/03/2017   NA 140 03/03/2017   K 4.3 03/03/2017   CL 102 03/03/2017   CO2 29 03/03/2017   Lab Results  Component Value Date   WBC 5.7 05/07/2017   HGB 12.7 05/07/2017   HCT 37.4 05/07/2017   MCV 100.3 (H) 05/07/2017   PLT 211 05/07/2017

## 2017-09-10 NOTE — Assessment & Plan Note (Signed)
Continues with Imuran and Pentasa, clinically doing well. DEXA scan due in Dec 2019, and patient reports PCP will be ordering this. Labs upcoming with Hematology. Renal function remains normal. Return in 6 months.

## 2017-09-11 NOTE — Progress Notes (Signed)
CC'ED TO PCP 

## 2017-09-12 ENCOUNTER — Ambulatory Visit (HOSPITAL_COMMUNITY): Payer: Self-pay | Admitting: Psychiatry

## 2017-09-24 ENCOUNTER — Encounter (HOSPITAL_COMMUNITY): Payer: Self-pay | Admitting: Psychiatry

## 2017-09-24 ENCOUNTER — Encounter

## 2017-09-24 ENCOUNTER — Ambulatory Visit (INDEPENDENT_AMBULATORY_CARE_PROVIDER_SITE_OTHER): Payer: Medicare Other | Admitting: Psychiatry

## 2017-09-24 VITALS — BP 130/72 | HR 100 | Ht 67.5 in | Wt 225.0 lb

## 2017-09-24 DIAGNOSIS — F331 Major depressive disorder, recurrent, moderate: Secondary | ICD-10-CM | POA: Diagnosis not present

## 2017-09-24 MED ORDER — ARIPIPRAZOLE 2 MG PO TABS: 5.0000 mg | ORAL_TABLET | Freq: Every day | ORAL | 4 refills | Status: DC

## 2017-09-24 MED ORDER — GABAPENTIN 400 MG PO CAPS: ORAL_CAPSULE | ORAL | 4 refills | Status: DC

## 2017-09-24 MED ORDER — DULOXETINE HCL 60 MG PO CPEP: ORAL_CAPSULE | ORAL | 5 refills | Status: DC

## 2017-09-24 NOTE — Progress Notes (Signed)
Psychiatric Initial Adult Assessment   Patient Identification: Sheri [name] MRN:  [id number] Date of Evaluation:  09/24/2017 Referral Source: Dr.Karen Delice Lesch Chief Complaint:   Visit Diagnosis:  No diagnosis found.  History of Present Illness:  Patient seems to be doing better. She's having some memory masses sometimes her she does not attend completely but overall she's, functioning fairly well.She is sleeping and eating well. She enjoys the television. Her son is girlfriend is pregnant with a girl. Today the patient is seen with Jody. Jeral Fruit has 2 girls ages 27 and 53. Patient enjoys playing all her grandchildren. She still has a lot of shoulder pain but she believes that the Neurontin is helpful. She also takes Cymbalta 60 mg regularly. At this time we will go ahead and reduce her Abilify from a dose of 5 mg down to a dose of 2 mg. The patient drinks no alcohol uses no drugs. He is not in therapy at this time. The patient's energy is reasonably good. She is not suicidal. She shows no evidence of psychosis. (Hypo) Manic Symptoms:   Anxiety Symptoms:   Psychotic Symptoms:   PTSD Symptoms:   Past Psychiatric History: Celexa 40 mg   Previous Psychotropic Medications: Yes   Substance Abuse History in the last 12 months:  No.  Consequences of Substance Abuse:   Past Medical History:  Past Medical History:  Diagnosis Date  . Anemia   . Chronic back pain   . Chronic neck pain   . Complication of anesthesia   . Crohn disease ([place])   . Fibromyalgia   . GERD (gastroesophageal reflux disease)   . Hypertension   . Low back pain   . Osteopenia    Last DEXA 12/2010 was normal, on fosamax  . PONV (postoperative nausea and vomiting)   . Skin cancer    sees Dr. Tarri Glenn in [place]    Past Surgical History:  Procedure Laterality Date  . APPENDECTOMY  1990  . BREAST EXCISIONAL BIOPSY Right   . CATARACT EXTRACTION W/PHACO Left 01/13/2014   Procedure: CATARACT EXTRACTION PHACO AND INTRAOCULAR  LENS PLACEMENT LEFT EYE;  Surgeon: Tonny Branch, MD;  Location: [facility];  Service: Ophthalmology;  Laterality: Left;  CDE:6.90  . CATARACT EXTRACTION W/PHACO Right 01/24/2014   Procedure: CATARACT EXTRACTION PHACO AND INTRAOCULAR LENS PLACEMENT (IOC);  Surgeon: Tonny Branch, MD;  Location: [facility];  Service: Ophthalmology;  Laterality: Right;  CDE:5.91  . CESAREAN SECTION  [id number]  . COLONOSCOPY  09/11/2006  . COLONOSCOPY  10/30/2010   RMR: External and internal hemorrhoids, likely source of hematochezia  otherwise normal rectum/ Left-sided diverticula, status post prior right hemicolectomy with a friable, inflamed, stenotic surgical anastomosis with upstream dilation of the neoterminal ileum/ Crohn's noted, status post biopsy  . COLONOSCOPY N/A 06/23/2014   Dr. Rourk:friable eroded mucosa at the anastomosis with small bowel, consistent with some stenosis Crohn's disease s/p biopsy. Colonic diverticulosis and external hemorrhoids. Pathology with benign anastomotic mucosa, no dysplasia.   Marland Kitchen drainage of back abscess  2007  . ESOPHAGOGASTRODUODENOSCOPY  04/2009   noncritical schatzki's ring, small hh, sb bx negative  . ESOPHAGOGASTRODUODENOSCOPY (EGD) WITH PROPOFOL N/A 02/06/2017   Dr. Gala Romney: mild schatzki's ring at GE junction s/p dilation, small hiatal hernia, multiple 3 mm pedunculated and sessile polyps in gastric fundus, normal duodenum  . MALONEY DILATION N/A 02/06/2017   Procedure: Venia Minks DILATION;  Surgeon: Daneil Dolin, MD;  Location: [facility];  Service: Endoscopy;  Laterality: N/A;  . POLYPECTOMY  02/06/2017   Procedure: POLYPECTOMY;  Surgeon: Daneil Dolin, MD;  Location: [facility];  Service: Endoscopy;;  gastric  . SMALL INTESTINE SURGERY  [id number]    Family Psychiatric History:   Family History:  Family History  Problem Relation Age of Onset  . Crohn's disease Mother        succumbed to stomach cancer in her 63s  . Stomach cancer Mother   . Heart attack Father   . COPD  Father   . Colon cancer Neg Hx     Social History:   Social History   Socioeconomic History  . Marital status: Legally Separated    Spouse name: Not on file  . Number of children: 2  . Years of education: 75  . Highest education level: 12th grade  Occupational History  . Occupation: disability    Employer: NOT EMPLOYED  Social Needs  . Financial resource strain: Not very hard  . Food insecurity:    Worry: Never true    Inability: Never true  . Transportation needs:    Medical: No    Non-medical: No  Tobacco Use  . Smoking status: Never Smoker  . Smokeless tobacco: Never Used  Substance and Sexual Activity  . Alcohol use: No  . Drug use: No  . Sexual activity: Yes    Birth control/protection: None  Lifestyle  . Physical activity:    Days per week: 0 days    Minutes per session: 0 min  . Stress: To some extent  Relationships  . Social connections:    Talks on phone: More than three times a week    Gets together: More than three times a week    Attends religious service: Never    Active member of club or organization: No    Attends meetings of clubs or organizations: Never    Relationship status: Separated  Other Topics Concern  . Not on file  Social History Narrative  . Not on file    Additional Social History:   Allergies:   Allergies  Allergen Reactions  . Codeine Sulfate Nausea Only    Metabolic Disorder Labs: No results found for: HGBA1C, MPG No results found for: PROLACTIN No results found for: CHOL, TRIG, HDL, CHOLHDL, VLDL, LDLCALC   Current Medications: Current Outpatient Medications  Medication Sig Dispense Refill  . acetaminophen (TYLENOL) 500 MG tablet Take 1,000 mg by mouth every 6 (six) hours as needed for moderate pain or headache.    . ARIPiprazole (ABILIFY) 2 MG tablet Take 2.5 tablets (5 mg total) by mouth daily. 30 tablet 4  . azaTHIOprine (IMURAN) 50 MG tablet TAKE 2 & 1/2 TABLETS DAILY. (Patient taking differently: Take 125 mg by  mouth daily. ) 75 tablet 11  . azaTHIOprine (IMURAN) 50 MG tablet TAKE 2&1/2 TABLETS DAILY 75 tablet 5  . Biotin 10 MG CAPS Take 20 mg by mouth daily.    . Calcium Carbonate-Vitamin D (CALCIUM 600+D PO) Take 1 capsule by mouth 3 (three) times daily.     . Cholecalciferol (EQL VITAMIN D3) 2000 units CAPS Take 2,000 Units by mouth daily.    . cyanocobalamin (,VITAMIN B-12,) 1000 MCG/ML injection Inject 1,000 mcg into the muscle every 14 (fourteen) days.    . cyclobenzaprine (FLEXERIL) 10 MG tablet Take 10 mg by mouth 3 (three) times daily.     Marland Kitchen DEXILANT 60 MG capsule TAKE 1 CAPSULE BY MOUTH ONCE DAILY. 30 capsule 3  . DULoxetine (CYMBALTA) 60 MG capsule 1 qam  30 capsule 5  . ergocalciferol (VITAMIN D2) 50000 units capsule Take 50,000 Units by mouth every Sunday.    . gabapentin (NEURONTIN) 400 MG capsule 1  qam   2  qhs 90 capsule 4  . ketoconazole (NIZORAL) 2 % cream Apply 1 application topically daily as needed for irritation.    Javier Docker Oil 350 MG CAPS Take 350 mg by mouth daily.    Marland Kitchen lisinopril-hydrochlorothiazide (PRINZIDE,ZESTORETIC) 20-12.5 MG per tablet Take 1 tablet by mouth daily. 30 tablet 0  . nortriptyline (PAMELOR) 25 MG capsule 2  qhs (Patient taking differently: Take 75 mg by mouth at bedtime. ) 60 capsule 4  . Omega-3 Fatty Acids (FISH OIL PO) Take 1 capsule by mouth at bedtime.    Marland Kitchen oxyCODONE (ROXICODONE) 15 MG immediate release tablet Take 15 mg by mouth every 6 (six) hours as needed for pain.     Marland Kitchen PENTASA 500 MG CR capsule TAKE (2) CAPSULES FOUR TIMES DAILY. 240 capsule 5  . temazepam (RESTORIL) 15 MG capsule Take 2 capsules (30 mg total) by mouth at bedtime. 60 capsule 4  . VITAMIN E PO Take 1 Units by mouth 2 (two) times daily.     Marland Kitchen zinc gluconate 50 MG tablet Take 50 mg by mouth daily.       No current facility-administered medications for this visit.     Neurologic: Headache:  Seizure: No Paresthesias:No  Musculoskeletal: Strength & Muscle Tone: within normal  limits Gait & Station: normal Patient leans: Right  Psychiatric Specialty Exam: ROS  Blood pressure 130/72, pulse 100, height 5' 7.5" (1.715 m), weight 225 lb (102.1 kg), SpO2 92 %.Body mass index is 34.72 kg/m.  General Appearance: Casual  Eye Contact:  Fair  Speech:  Negative  Volume:  Normal  Mood:  NA  Affect:  Congruent  Thought Process:  Coherent  Orientation:  NA  Thought Content:  WDL  Suicidal Thoughts:  No  Homicidal Thoughts:  No  Memory:  Negative  Judgement:  Good  Insight:  Present  Psychomotor Activity:  Normal  Concentration:    Recall:    Fund of Knowledge:Good  Language: Fair  Akathisia:  No  Handed:  Right  AIMS (if indicated):    Assets:    ADL's:    Cognition:   nt Plan Summary: 8/14/20193:26 PM   This patient's first problem is that of clinical depression which actually is gotten better. She takes Cymbalta 60 mg a day. Overall her mood is improved and she has few vegetative symptoms. At this time she shows no evidence of tardive dyskinesia. At this time we'll go ahead and reduce her Abilify from 32m to 2 mg with plans to possibly discontinuing at her next visit. Her second problem is that of anxiety. At this time she'll continue taking Neurontin which is very helpful.at her next visit the possibility of increasing her Cymbalta for her pain should be considered. Her second problem is a problem with sleep which is improved on Neurontin,taken at night. Today the patient denies any physical complaints. She denies any neurological symptoms at all.she is not fatiguedand actually has reasonably good energy. This patient to return to see me in 3 months again will consider the possibility of discontinuing her Abilify possibly for pain is not improved consider increasing her Cymbalta dose 120 mg.

## 2017-09-26 ENCOUNTER — Other Ambulatory Visit (HOSPITAL_COMMUNITY): Payer: Self-pay | Admitting: Psychiatry

## 2017-10-10 ENCOUNTER — Telehealth (HOSPITAL_COMMUNITY): Payer: Self-pay

## 2017-10-10 NOTE — Telephone Encounter (Signed)
Called Medicare to obtain Prior Authorization for Aripiprazole 96m tabs.  [facility] and that's what this med should be ran under. CLor[place]and informed them of this info. Spoke with Tammy.. Patient had picked up 526mof this medication on 09-30-17 and was told to take 1/2 of the tablets

## 2017-10-23 ENCOUNTER — Other Ambulatory Visit (HOSPITAL_COMMUNITY): Payer: Self-pay

## 2017-10-31 ENCOUNTER — Other Ambulatory Visit: Payer: Self-pay | Admitting: Gastroenterology

## 2017-11-12 ENCOUNTER — Encounter: Payer: Self-pay | Admitting: Neurology

## 2017-11-12 ENCOUNTER — Other Ambulatory Visit: Payer: Self-pay

## 2017-11-12 ENCOUNTER — Ambulatory Visit (INDEPENDENT_AMBULATORY_CARE_PROVIDER_SITE_OTHER): Payer: Medicare Other | Admitting: Neurology

## 2017-11-12 VITALS — BP 114/76 | HR 94 | Ht 68.0 in | Wt 231.0 lb

## 2017-11-12 DIAGNOSIS — G894 Chronic pain syndrome: Secondary | ICD-10-CM | POA: Diagnosis not present

## 2017-11-12 DIAGNOSIS — G3184 Mild cognitive impairment, so stated: Secondary | ICD-10-CM

## 2017-11-12 DIAGNOSIS — F329 Major depressive disorder, single episode, unspecified: Secondary | ICD-10-CM

## 2017-11-12 DIAGNOSIS — F32A Depression, unspecified: Secondary | ICD-10-CM

## 2017-11-12 MED ORDER — DONEPEZIL HCL 10 MG PO TABS: ORAL_TABLET | ORAL | 11 refills | Status: DC
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
## 2017-11-12 NOTE — Patient Instructions (Addendum)
1. Start Donepezil 52m: Take 1/2 tablet daily for 2 weeks, then increase to 1 tablet daily 2. [place] [facility] [facility] [facility] [place] to [place] 3. Continue working with [facility] 4. Follow-up in 6 months or so, call for any changes  FALL PRECAUTIONS: Be cautious when walking. Scan the area for obstacles that may increase the risk of trips and falls. When getting up in the mornings, sit up at the edge of the bed for a few minutes before getting out of bed. Consider elevating the bed at the head end to avoid drop of blood pressure when getting up. Walk always in a well-lit room (use night lights in the walls). Avoid area rugs or power cords from appliances in the middle of the walkways. Use a walker or a cane if necessary and consider physical therapy for balance exercise. Get your eyesight checked regularly.  FINANCIAL OVERSIGHT: Supervision, especially oversight when making financial decisions or transactions is also recommended.  HOME SAFETY: Consider the safety of the kitchen when operating appliances like stoves, microwave oven, and blender. Consider having supervision and share cooking responsibilities until no longer able to participate in those. Accidents with firearms and other hazards in the house should be identified and addressed as well.  DRIVING: Regarding driving, in patients with progressive memory problems, driving will be impaired. We advise to have someone else do the driving if trouble finding directions or if minor accidents are reported. Independent driving assessment is available to determine safety of driving.  ABILITY TO BE LEFT ALONE: If patient is unable to contact 911 operator, consider using LifeLine, or when the need is there, arrange for someone to stay with patients. Smoking is a fire hazard, consider supervision or cessation. Risk of wandering should be assessed by caregiver and if detected at any point,  supervision and safe proof recommendations should be instituted.  MEDICATION SUPERVISION: Inability to self-administer medication needs to be constantly addressed. Implement a mechanism to ensure safe administration of the medications.  RECOMMENDATIONS FOR ALL PATIENTS WITH MEMORY PROBLEMS: 1. Continue to exercise (Recommend 30 minutes of walking everyday, or 3 hours every week) 2. Increase social interactions - continue going to [place] enjoy social gatherings with friends and family 3. Eat healthy, avoid fried foods and eat more fruits and vegetables 4. Maintain adequate blood pressure, blood sugar, and blood cholesterol level. Reducing the risk of stroke and cardiovascular disease also helps promoting better memory. 5. Avoid stressful situations. Live a simple life and avoid aggravations. Organize your time and prepare for the next day in anticipation. 6. Sleep well, avoid any interruptions of sleep and avoid any distractions in the bedroom that may interfere with adequate sleep quality 7. Avoid sugar, avoid sweets as there is a strong link between excessive sugar intake, diabetes, and cognitive impairment The Mediterranean diet has been shown to help patients reduce the risk of progressive memory disorders and reduces cardiovascular risk. This includes eating fish, eat fruits and green leafy vegetables, nuts like almonds and hazelnuts, walnuts, and also use olive oil. Avoid fast foods and fried foods as much as possible. Avoid sweets and sugar as sugar use has been linked to worsening of memory function.  There is always a concern of gradual progression of memory problems. If this is the case, then we may need to adjust level of care according to patient needs. Support, both to the patient and caregiver, should then be put into place.

## 2017-11-21 ENCOUNTER — Inpatient Hospital Stay (HOSPITAL_COMMUNITY): Payer: Medicare Other | Attending: Hematology

## 2017-11-21 DIAGNOSIS — E538 Deficiency of other specified B group vitamins: Secondary | ICD-10-CM | POA: Insufficient documentation

## 2017-11-21 DIAGNOSIS — K5080 Crohn's disease of both small and large intestine without complications: Secondary | ICD-10-CM | POA: Insufficient documentation

## 2017-11-21 DIAGNOSIS — I10 Essential (primary) hypertension: Secondary | ICD-10-CM | POA: Insufficient documentation

## 2017-11-21 DIAGNOSIS — D7589 Other specified diseases of blood and blood-forming organs: Secondary | ICD-10-CM | POA: Insufficient documentation

## 2017-11-21 DIAGNOSIS — Z87891 Personal history of nicotine dependence: Secondary | ICD-10-CM | POA: Diagnosis not present

## 2017-11-21 DIAGNOSIS — D61818 Other pancytopenia: Secondary | ICD-10-CM | POA: Insufficient documentation

## 2017-11-21 DIAGNOSIS — D539 Nutritional anemia, unspecified: Secondary | ICD-10-CM

## 2017-11-21 LAB — CBC WITH DIFFERENTIAL/PLATELET
ABS IMMATURE GRANULOCYTES: 0.03 10*3/uL (ref 0.00–0.07)
BASOS ABS: 0 10*3/uL (ref 0.0–0.1)
BASOS PCT: 1 %
Eosinophils Absolute: 0.1 10*3/uL (ref 0.0–0.5)
Eosinophils Relative: 2 %
HCT: 36.3 % (ref 36.0–46.0)
Hemoglobin: 12.3 g/dL (ref 12.0–15.0)
Immature Granulocytes: 1 %
LYMPHS PCT: 33 %
Lymphs Abs: 2 10*3/uL (ref 0.7–4.0)
MCH: 34.3 pg — AB (ref 26.0–34.0)
MCHC: 33.9 g/dL (ref 30.0–36.0)
MCV: 101.1 fL — ABNORMAL HIGH (ref 80.0–100.0)
MONO ABS: 0.6 10*3/uL (ref 0.1–1.0)
Monocytes Relative: 11 %
NEUTROS ABS: 3.2 10*3/uL (ref 1.7–7.7)
NEUTROS PCT: 52 %
NRBC: 0.3 % — AB (ref 0.0–0.2)
PLATELETS: 190 10*3/uL (ref 150–400)
RBC: 3.59 MIL/uL — AB (ref 3.87–5.11)
RDW: 13.5 % (ref 11.5–15.5)
WBC: 6 10*3/uL (ref 4.0–10.5)

## 2017-11-21 LAB — COMPREHENSIVE METABOLIC PANEL
ALBUMIN: 3.5 g/dL (ref 3.5–5.0)
ALT: 25 U/L (ref 0–44)
AST: 35 U/L (ref 15–41)
Alkaline Phosphatase: 99 U/L (ref 38–126)
Anion gap: 9 (ref 5–15)
BUN: 12 mg/dL (ref 8–23)
CHLORIDE: 106 mmol/L (ref 98–111)
CO2: 24 mmol/L (ref 22–32)
Calcium: 9.1 mg/dL (ref 8.9–10.3)
Creatinine, Ser: 0.64 mg/dL (ref 0.44–1.00)
GFR calc Af Amer: >60 mL/min (ref >60)
GLUCOSE: 126 mg/dL — AB (ref 70–99)
POTASSIUM: 3.6 mmol/L (ref 3.5–5.1)
Sodium: 139 mmol/L (ref 135–145)
Total Bilirubin: 1.7 mg/dL — ABNORMAL HIGH (ref 0.3–1.2)
Total Protein: 7.2 g/dL (ref 6.5–8.1)

## 2017-11-21 LAB — RETICULOCYTES
IMMATURE RETIC FRACT: 14.8 % (ref 2.3–15.9)
RBC.: 3.59 MIL/uL — ABNORMAL LOW (ref 3.87–5.11)
RETIC COUNT ABSOLUTE: 73.6 10*3/uL (ref 19.0–186.0)
RETIC CT PCT: 2.1 % (ref 0.4–3.1)

## 2017-11-21 LAB — LACTATE DEHYDROGENASE: LDH: 154 U/L (ref 98–192)

## 2017-11-25 ENCOUNTER — Ambulatory Visit (HOSPITAL_COMMUNITY): Payer: Self-pay | Admitting: Hematology

## 2017-11-26 ENCOUNTER — Telehealth: Payer: Self-pay | Admitting: Neurology

## 2017-11-26 ENCOUNTER — Ambulatory Visit (HOSPITAL_COMMUNITY): Payer: Self-pay | Admitting: Oncology

## 2017-11-26 NOTE — Telephone Encounter (Signed)
Spoke with pt she states that she has had diarrhea since starting Donepezil and wondering if it will let up.  I advised that it usually does.

## 2017-11-26 NOTE — Telephone Encounter (Signed)
Patient left a voicemail needing to speak with someone about her Donepezil medication. Please call her back at [phone]. Thanks!

## 2017-11-28 ENCOUNTER — Encounter (HOSPITAL_COMMUNITY): Payer: Self-pay | Admitting: Oncology

## 2017-11-28 ENCOUNTER — Ambulatory Visit (HOSPITAL_COMMUNITY): Payer: Self-pay | Admitting: Hematology

## 2017-11-28 DIAGNOSIS — D7589 Other specified diseases of blood and blood-forming organs: Secondary | ICD-10-CM

## 2017-11-28 HISTORY — DX: Other specified diseases of blood and blood-forming organs: D75.89

## 2017-11-28 NOTE — Assessment & Plan Note (Addendum)
Macrocytosis without anemia, likely secondary medication versus hepatic steatosis, with negative peripheral hematology work-up. Medications include Pentasa and Imuran.  Labs on 11/21/2017 reviewed: CBC diff.  I personally reviewed and went over laboratory results with the patient.  The results are noted within this dictation.  Labs at time illustrated a WBC of 6.0 with normal differential, Hgb of 12.3 g/dL, macrocytosis with MCV of 101.1, and platelet count of 190K.    No role for labs today.  Labs in 6 months: CBC diff, pathology smear review.  No role for bone marrow aspiration and biopsy at this time: Indications for bone marrow aspiration and biopsy:  A. Unexplained anemia  B. Macrocytic anemia (to distinguish megaloblastic from normoblastic maturation)  C. Unexplained leukopenia  D. Unexplained thrombocytopenia  E. Pancytopenia  F. Presence of blasts on peripheral smear (investigation for possible leukemia)  G. Presence of teardrop red cells on peripheral smear (possible myelofibrosis)  H. Presence of hairy cells on peripheral smear (possible hairy cell leukemia)  I. Suspected Multiple Myeloma  J. Staging for non-Hodgkin's Lymphoma  K. Unexplained splenomegaly (possible splenomegaly)  L. Suspected storage disease (eg: Gaucher disease, Niemann-Pick)  M. Fever of unknown origin  N. Suspected chromosomal disorders in neonates (requiring rapid confirmation).  O. Confirmation of normal marrow in potential allogenic donor  P. Work-up for amyloidosis (to detect clonal plasma cell disorder)   Return in 6 months for follow-up.

## 2017-12-01 ENCOUNTER — Inpatient Hospital Stay (HOSPITAL_BASED_OUTPATIENT_CLINIC_OR_DEPARTMENT_OTHER): Payer: Medicare Other | Admitting: Oncology

## 2017-12-01 ENCOUNTER — Other Ambulatory Visit: Payer: Self-pay

## 2017-12-01 ENCOUNTER — Encounter (HOSPITAL_COMMUNITY): Payer: Self-pay | Admitting: Oncology

## 2017-12-01 VITALS — BP 144/69 | HR 101 | Temp 98.2°F | Resp 18 | Wt 236.0 lb

## 2017-12-01 DIAGNOSIS — I10 Essential (primary) hypertension: Secondary | ICD-10-CM

## 2017-12-01 DIAGNOSIS — D7589 Other specified diseases of blood and blood-forming organs: Secondary | ICD-10-CM

## 2017-12-01 DIAGNOSIS — D61818 Other pancytopenia: Secondary | ICD-10-CM

## 2017-12-01 DIAGNOSIS — Z87891 Personal history of nicotine dependence: Secondary | ICD-10-CM

## 2017-12-01 DIAGNOSIS — K5080 Crohn's disease of both small and large intestine without complications: Secondary | ICD-10-CM

## 2017-12-01 DIAGNOSIS — E538 Deficiency of other specified B group vitamins: Secondary | ICD-10-CM | POA: Diagnosis not present

## 2017-12-01 NOTE — Progress Notes (Signed)
t       Sheri Blitz, MD [place] [id number]  Macrocytosis without anemia - Plan: CBC with Differential, Pathologist smear review  Vitamin B 12 deficiency  Essential hypertension  Crohn's disease of both small and large intestine without complication ([place])  Former smoker   HISTORY OF PRESENT ILLNESS: Macrocytosis without anemia, likely secondary medication versus hepatic steatosis, with negative peripheral hematology work-up.  Medications include Pentasa and Imuran. AND B12 deficiency, on B12 IM replacement therapy twice per month.  CURRENT THERAPY: Observation  CURRENT STATUS: Sheri [name] 67 y.o. female returns for followup of macrocytosis in the setting of Pentasa and Imuran medications for Crohn's disease and B12 deficiency on IM B12 replacement every 2 weeks.  She is doing well overall.  She denies any B symptoms.  Her appetite is good and her weight is stable.  She knows that her Crohn's disease is well controlled and she denies any blood in her stools or black stools.  She denies any significant neurological changes, particularly with her baseline neuropathy.    Review of Systems  Constitutional: Negative.  Negative for chills, fever and weight loss.  HENT: Negative.   Eyes: Negative.   Respiratory: Negative.  Negative for cough.   Cardiovascular: Negative.  Negative for chest pain.  Gastrointestinal: Positive for diarrhea (Secondary to Crohn's disease). Negative for blood in stool, constipation, melena, nausea and vomiting.  Genitourinary: Negative.   Musculoskeletal: Negative.   Skin: Negative.   Neurological: Negative.  Negative for weakness.  Endo/Heme/Allergies: Negative.   Psychiatric/Behavioral: Negative.     Past Medical History:  Diagnosis Date  . Anemia   . Chronic back pain   . Chronic neck pain   . Complication of anesthesia   . Crohn disease ([place])   . Fibromyalgia   . GERD (gastroesophageal reflux disease)   . Hypertension   .  Low back pain   . Macrocytosis without anemia 11/28/2017  . Osteopenia    Last DEXA 12/2010 was normal, on fosamax  . PONV (postoperative nausea and vomiting)   . Skin cancer    sees Dr. Tarri Glenn in [place]    Past Surgical History:  Procedure Laterality Date  . APPENDECTOMY  1990  . BREAST EXCISIONAL BIOPSY Right   . CATARACT EXTRACTION W/PHACO Left 01/13/2014   Procedure: CATARACT EXTRACTION PHACO AND INTRAOCULAR LENS PLACEMENT LEFT EYE;  Surgeon: Tonny Branch, MD;  Location: [facility];  Service: Ophthalmology;  Laterality: Left;  CDE:6.90  . CATARACT EXTRACTION W/PHACO Right 01/24/2014   Procedure: CATARACT EXTRACTION PHACO AND INTRAOCULAR LENS PLACEMENT (IOC);  Surgeon: Tonny Branch, MD;  Location: [facility];  Service: Ophthalmology;  Laterality: Right;  CDE:5.91  . CESAREAN SECTION  [id number]  . COLONOSCOPY  09/11/2006  . COLONOSCOPY  10/30/2010   RMR: External and internal hemorrhoids, likely source of hematochezia  otherwise normal rectum/ Left-sided diverticula, status post prior right hemicolectomy with a friable, inflamed, stenotic surgical anastomosis with upstream dilation of the neoterminal ileum/ Crohn's noted, status post biopsy  . COLONOSCOPY N/A 06/23/2014   Dr. Rourk:friable eroded mucosa at the anastomosis with small bowel, consistent with some stenosis Crohn's disease s/p biopsy. Colonic diverticulosis and external hemorrhoids. Pathology with benign anastomotic mucosa, no dysplasia.   Marland Kitchen drainage of back abscess  2007  . ESOPHAGOGASTRODUODENOSCOPY  04/2009   noncritical schatzki's ring, small hh, sb bx negative  . ESOPHAGOGASTRODUODENOSCOPY (EGD) WITH PROPOFOL N/A 02/06/2017   Dr. Gala Romney: mild schatzki's ring at GE junction s/p dilation, small hiatal  hernia, multiple 3 mm pedunculated and sessile polyps in gastric fundus, normal duodenum  . MALONEY DILATION N/A 02/06/2017   Procedure: Venia Minks DILATION;  Surgeon: Daneil Dolin, MD;  Location: [facility];  Service: Endoscopy;   Laterality: N/A;  . POLYPECTOMY  02/06/2017   Procedure: POLYPECTOMY;  Surgeon: Daneil Dolin, MD;  Location: [facility];  Service: Endoscopy;;  gastric  . SMALL INTESTINE SURGERY  [id number]    Family History  Problem Relation Age of Onset  . Crohn's disease Mother        succumbed to stomach cancer in her 12s  . Stomach cancer Mother   . Heart attack Father   . COPD Father   . Colon cancer Neg Hx     Social History   Socioeconomic History  . Marital status: Legally Separated    Spouse name: Not on file  . Number of children: 2  . Years of education: 36  . Highest education level: 12th grade  Occupational History  . Occupation: disability    Employer: NOT EMPLOYED  Social Needs  . Financial resource strain: Not very hard  . Food insecurity:    Worry: Never true    Inability: Never true  . Transportation needs:    Medical: No    Non-medical: No  Tobacco Use  . Smoking status: Never Smoker  . Smokeless tobacco: Never Used  Substance and Sexual Activity  . Alcohol use: No  . Drug use: No  . Sexual activity: Yes    Birth control/protection: None  Lifestyle  . Physical activity:    Days per week: 0 days    Minutes per session: 0 min  . Stress: To some extent  Relationships  . Social connections:    Talks on phone: More than three times a week    Gets together: More than three times a week    Attends religious service: Never    Active member of club or organization: No    Attends meetings of clubs or organizations: Never    Relationship status: Separated  Other Topics Concern  . Not on file  Social History Narrative  . Not on file     PHYSICAL EXAMINATION  ECOG PERFORMANCE STATUS: 1 - Symptomatic but completely ambulatory  Vitals:   12/01/17 0950  BP: (!) 144/69  Pulse: (!) 101  Resp: 18  Temp: 98.2 F (36.8 C)  SpO2: 96%    GENERAL:alert, no distress, well nourished, well developed, comfortable, cooperative, obese, smiling and chronically  ill-appearing SKIN: skin color, texture, turgor are normal, no rashes or significant lesions HEAD: Normocephalic, No masses, lesions, tenderness or abnormalities EYES: normal, EOMI, Conjunctiva are pink and non-injected EARS: External ears normal OROPHARYNX:lips, buccal mucosa, and tongue normal  NECK: supple, no adenopathy, trachea midline LYMPH:  no palpable lymphadenopathy BREAST:not examined LUNGS: clear to auscultation and percussion HEART: regular rate & rhythm, no murmurs and no gallops ABDOMEN:abdomen soft, obese and normal bowel sounds BACK: Back symmetric, no curvature. EXTREMITIES:less then 2 second capillary refill, no joint deformities, effusion, or inflammation, no skin discoloration, no cyanosis  NEURO: alert & oriented x 3 with fluent speech, no focal motor/sensory deficits, gait normal   LABORATORY DATA: CBC    Component Value Date/Time   WBC 6.0 11/21/2017 1421   RBC 3.59 (L) 11/21/2017 1421   RBC 3.59 (L) 11/21/2017 1421   HGB 12.3 11/21/2017 1421   HCT 36.3 11/21/2017 1421   HCT 37.4 05/07/2017 1455   HCT 38 03/11/2014  PLT 190 11/21/2017 1421   MCV 101.1 (H) 11/21/2017 1421   MCV 97.1 12/10/2010 0959   MCH 34.3 (H) 11/21/2017 1421   MCHC 33.9 11/21/2017 1421   RDW 13.5 11/21/2017 1421   LYMPHSABS 2.0 11/21/2017 1421   MONOABS 0.6 11/21/2017 1421   EOSABS 0.1 11/21/2017 1421   BASOSABS 0.0 11/21/2017 1421      Chemistry      Component Value Date/Time   NA 139 11/21/2017 1421   K 3.6 11/21/2017 1421   CL 106 11/21/2017 1421   CO2 24 11/21/2017 1421   BUN 12 11/21/2017 1421   CREATININE 0.64 11/21/2017 1421   CREATININE 0.89 03/03/2017 1513   GLU 94 04/03/2011 0850      Component Value Date/Time   CALCIUM 9.1 11/21/2017 1421   ALKPHOS 99 11/21/2017 1421   ALKPHOS 60 04/03/2011 0850   AST 35 11/21/2017 1421   AST 21 04/03/2011 0850   ALT 25 11/21/2017 1421   BILITOT 1.7 (H) 11/21/2017 1421       RADIOGRAPHIC STUDIES:  No results  found.   PATHOLOGY:    ASSESSMENT AND PLAN:  Macrocytosis without anemia Macrocytosis without anemia, likely secondary medication versus hepatic steatosis, with negative peripheral hematology work-up. Medications include Pentasa and Imuran.  Labs on 11/21/2017 reviewed: CBC diff.  I personally reviewed and went over laboratory results with the patient.  The results are noted within this dictation.  Labs at time illustrated a WBC of 6.0 with normal differential, Hgb of 12.3 g/dL, macrocytosis with MCV of 101.1, and platelet count of 190K.    No role for labs today.  Labs in 6 months: CBC diff, pathology smear review.  No role for bone marrow aspiration and biopsy at this time: Indications for bone marrow aspiration and biopsy:  A. Unexplained anemia  B. Macrocytic anemia (to distinguish megaloblastic from normoblastic maturation)  C. Unexplained leukopenia  D. Unexplained thrombocytopenia  E. Pancytopenia  F. Presence of blasts on peripheral smear (investigation for possible leukemia)  G. Presence of teardrop red cells on peripheral smear (possible myelofibrosis)  H. Presence of hairy cells on peripheral smear (possible hairy cell leukemia)  I. Suspected Multiple Myeloma  J. Staging for non-Hodgkin's Lymphoma  K. Unexplained splenomegaly (possible splenomegaly)  L. Suspected storage disease (eg: Gaucher disease, Niemann-Pick)  M. Fever of unknown origin  N. Suspected chromosomal disorders in neonates (requiring rapid confirmation).  O. Confirmation of normal marrow in potential allogenic donor  P. Work-up for amyloidosis (to detect clonal plasma cell disorder)   Return in 6 months for follow-up.    2. Vitamin B 12 deficiency On IM replacement therapy twice per month.  3. Essential hypertension Blood pressure today is 144/69.  Systolic pressures above goal.  Current antihypertensive regimen consist of lisinopril/HCTZ.  4. Crohn's disease of both small and large intestine  without complication ([facility]) On Imuran and Pentasa.  Followed by GI.  5. Former smoker Noted.   ORDERS PLACED FOR THIS ENCOUNTER: Orders Placed This Encounter  Procedures  . CBC with Differential  . Pathologist smear review    MEDICATIONS PRESCRIBED THIS ENCOUNTER: No orders of the defined types were placed in this encounter.   THERAPY PLAN:  Observation.  All questions were answered. The patient knows to call the clinic with any problems, questions or concerns. We can certainly see the patient much sooner if necessary.  Patient and plan discussed with Dr. Derek Jack and she is in agreement with the aforementioned.   This note  is electronically signed by: Robynn Pane, PA-C 12/01/2017 10:06 AM

## 2017-12-01 NOTE — Patient Instructions (Signed)
[facility] at [facility] Discharge Instructions  Blood counts from 11/21/2017 are reviewed and are stable. Laboratory work in 6 months. Return in 6 months for follow-up.   Thank you for choosing [place] at [facility] to provide your oncology and hematology care.  To afford each patient quality time with our provider, please arrive at least 15 minutes before your scheduled appointment time.   If you have a lab appointment with the [facility] please come in thru the  Main Entrance and check in at the main information desk  You need to re-schedule your appointment should you arrive 10 or more minutes late.  We strive to give you quality time with our providers, and arriving late affects you and other patients whose appointments are after yours.  Also, if you no show three or more times for appointments you may be dismissed from the clinic at the providers discretion.     Again, thank you for choosing [facility].  Our hope is that these requests will decrease the amount of time that you wait before being seen by our physicians.       _____________________________________________________________  Should you have questions after your visit to [facility], please contact our office at [phone] between the hours of 8:00 a.m. and 4:30 p.m.  Voicemails left after 4:00 p.m. will not be returned until the following business day.  For prescription refill requests, have your pharmacy contact our office and allow 72 hours.    Cancer Center Support Programs:   > Cancer Support Group  2nd Tuesday of the month 1pm-2pm, Journey Room

## 2017-12-10 ENCOUNTER — Telehealth: Payer: Self-pay | Admitting: Internal Medicine

## 2017-12-10 NOTE — Telephone Encounter (Signed)
Recall for dexa 01/2018

## 2017-12-10 NOTE — Telephone Encounter (Signed)
Note sent to AM in error.

## 2017-12-10 NOTE — Telephone Encounter (Signed)
Letter mailed

## 2017-12-10 NOTE — Telephone Encounter (Signed)
Recall for dexa scan 01/2018

## 2017-12-23 NOTE — Progress Notes (Deleted)
Psychiatric Initial Adult Assessment   Patient Identification: Sheri [name] MRN:  [id number] Date of Evaluation:  12/23/2017 Referral Source: Dr. Ellouise Newer Chief Complaint:   Visit Diagnosis: No diagnosis found.  History of Present Illness:   Sheri [name] is a 67 y.o. year old female with a history of depression, crohn's disease, macroocytic anemia, who is referred for depression.     Associated Signs/Symptoms: Depression Symptoms:  {DEPRESSION SYMPTOMS:20000} (Hypo) Manic Symptoms:  {BHH MANIC SYMPTOMS:22872} Anxiety Symptoms:  {BHH ANXIETY SYMPTOMS:22873} Psychotic Symptoms:  {BHH PSYCHOTIC SYMPTOMS:22874} PTSD Symptoms: {BHH PTSD SYMPTOMS:22875}  Past Psychiatric History:  Outpatient:  Psychiatry admission:  Previous suicide attempt:  Past trials of medication:  History of violence:   Previous Psychotropic Medications: {YES/NO:21197}  Substance Abuse History in the last 12 months:  {yes no:314532}  Consequences of Substance Abuse: {BHH CONSEQUENCES OF SUBSTANCE ABUSE:22880}  Past Medical History:  Past Medical History:  Diagnosis Date  . Anemia   . Chronic back pain   . Chronic neck pain   . Complication of anesthesia   . Crohn disease ([place])   . Fibromyalgia   . GERD (gastroesophageal reflux disease)   . Hypertension   . Low back pain   . Macrocytosis without anemia 11/28/2017  . Osteopenia    Last DEXA 12/2010 was normal, on fosamax  . PONV (postoperative nausea and vomiting)   . Skin cancer    sees Dr. Tarri Glenn in [place]    Past Surgical History:  Procedure Laterality Date  . APPENDECTOMY  1990  . BREAST EXCISIONAL BIOPSY Right   . CATARACT EXTRACTION W/PHACO Left 01/13/2014   Procedure: CATARACT EXTRACTION PHACO AND INTRAOCULAR LENS PLACEMENT LEFT EYE;  Surgeon: Tonny Branch, MD;  Location: [facility];  Service: Ophthalmology;  Laterality: Left;  CDE:6.90  . CATARACT EXTRACTION W/PHACO Right 01/24/2014   Procedure: CATARACT EXTRACTION PHACO AND  INTRAOCULAR LENS PLACEMENT (IOC);  Surgeon: Tonny Branch, MD;  Location: [facility];  Service: Ophthalmology;  Laterality: Right;  CDE:5.91  . CESAREAN SECTION  [id number]  . COLONOSCOPY  09/11/2006  . COLONOSCOPY  10/30/2010   RMR: External and internal hemorrhoids, likely source of hematochezia  otherwise normal rectum/ Left-sided diverticula, status post prior right hemicolectomy with a friable, inflamed, stenotic surgical anastomosis with upstream dilation of the neoterminal ileum/ Crohn's noted, status post biopsy  . COLONOSCOPY N/A 06/23/2014   Dr. Rourk:friable eroded mucosa at the anastomosis with small bowel, consistent with some stenosis Crohn's disease s/p biopsy. Colonic diverticulosis and external hemorrhoids. Pathology with benign anastomotic mucosa, no dysplasia.   Marland Kitchen drainage of back abscess  2007  . ESOPHAGOGASTRODUODENOSCOPY  04/2009   noncritical schatzki's ring, small hh, sb bx negative  . ESOPHAGOGASTRODUODENOSCOPY (EGD) WITH PROPOFOL N/A 02/06/2017   Dr. Gala Romney: mild schatzki's ring at GE junction s/p dilation, small hiatal hernia, multiple 3 mm pedunculated and sessile polyps in gastric fundus, normal duodenum  . MALONEY DILATION N/A 02/06/2017   Procedure: Venia Minks DILATION;  Surgeon: Daneil Dolin, MD;  Location: [facility];  Service: Endoscopy;  Laterality: N/A;  . POLYPECTOMY  02/06/2017   Procedure: POLYPECTOMY;  Surgeon: Daneil Dolin, MD;  Location: [facility];  Service: Endoscopy;;  gastric  . SMALL INTESTINE SURGERY  [id number]    Family Psychiatric History: ***  Family History:  Family History  Problem Relation Age of Onset  . Crohn's disease Mother        succumbed to stomach cancer in her 4s  . Stomach cancer Mother   .  Heart attack Father   . COPD Father   . Colon cancer Neg Hx     Social History:   Social History   Socioeconomic History  . Marital status: Legally Separated    Spouse name: Not on file  . Number of children: 2  . Years of  education: 59  . Highest education level: 12th grade  Occupational History  . Occupation: disability    Employer: NOT EMPLOYED  Social Needs  . Financial resource strain: Not very hard  . Food insecurity:    Worry: Never true    Inability: Never true  . Transportation needs:    Medical: No    Non-medical: No  Tobacco Use  . Smoking status: Never Smoker  . Smokeless tobacco: Never Used  Substance and Sexual Activity  . Alcohol use: No  . Drug use: No  . Sexual activity: Yes    Birth control/protection: None  Lifestyle  . Physical activity:    Days per week: 0 days    Minutes per session: 0 min  . Stress: To some extent  Relationships  . Social connections:    Talks on phone: More than three times a week    Gets together: More than three times a week    Attends religious service: Never    Active member of club or organization: No    Attends meetings of clubs or organizations: Never    Relationship status: Separated  Other Topics Concern  . Not on file  Social History Narrative  . Not on file    Additional Social History: ***  Allergies:   Allergies  Allergen Reactions  . Codeine Sulfate Nausea Only    Metabolic Disorder Labs: No results found for: HGBA1C, MPG No results found for: PROLACTIN No results found for: CHOL, TRIG, HDL, CHOLHDL, VLDL, LDLCALC   Current Medications: Current Outpatient Medications  Medication Sig Dispense Refill  . acetaminophen (TYLENOL) 500 MG tablet Take 1,000 mg by mouth every 6 (six) hours as needed for moderate pain or headache.    . ARIPiprazole (ABILIFY) 2 MG tablet Take 2.5 tablets (5 mg total) by mouth daily. 30 tablet 4  . azaTHIOprine (IMURAN) 50 MG tablet TAKE 2 & 1/2 TABLETS DAILY. (Patient taking differently: Take 125 mg by mouth daily. ) 75 tablet 11  . azaTHIOprine (IMURAN) 50 MG tablet TAKE 2&1/2 TABLETS DAILY 75 tablet 5  . azaTHIOprine (IMURAN) 50 MG tablet TAKE 2 & 1/2 TABLET DAILY. 75 tablet 5  . Biotin 10 MG  CAPS Take 20 mg by mouth daily.    . Calcium Carbonate-Vitamin D (CALCIUM 600+D PO) Take 1 capsule by mouth 3 (three) times daily.     . Cholecalciferol (EQL VITAMIN D3) 2000 units CAPS Take 2,000 Units by mouth daily.    . cyanocobalamin (,VITAMIN B-12,) 1000 MCG/ML injection Inject 1,000 mcg into the muscle every 14 (fourteen) days.    . cyclobenzaprine (FLEXERIL) 10 MG tablet Take 10 mg by mouth 3 (three) times daily.     Marland Kitchen DEXILANT 60 MG capsule TAKE 1 CAPSULE BY MOUTH ONCE DAILY. 30 capsule 3  . donepezil (ARICEPT) 10 MG tablet Take 1/2 tablet daily for 2 weeks, then increase to 1 tablet daily 30 tablet 11  . DULoxetine (CYMBALTA) 60 MG capsule 1 qam 30 capsule 5  . ergocalciferol (VITAMIN D2) 50000 units capsule Take 50,000 Units by mouth every Sunday.    . gabapentin (NEURONTIN) 400 MG capsule 1  qam   2  qhs  90 capsule 4  . ketoconazole (NIZORAL) 2 % cream Apply 1 application topically daily as needed for irritation.    Javier Docker Oil 350 MG CAPS Take 350 mg by mouth daily.    Marland Kitchen lisinopril-hydrochlorothiazide (PRINZIDE,ZESTORETIC) 20-12.5 MG per tablet Take 1 tablet by mouth daily. 30 tablet 0  . nortriptyline (PAMELOR) 25 MG capsule 2  qhs (Patient taking differently: Take 75 mg by mouth at bedtime. ) 60 capsule 4  . Omega-3 Fatty Acids (FISH OIL PO) Take 1 capsule by mouth at bedtime.    Marland Kitchen oxyCODONE (ROXICODONE) 15 MG immediate release tablet Take 15 mg by mouth every 6 (six) hours as needed for pain.     Marland Kitchen PENTASA 500 MG CR capsule TAKE (2) CAPSULES FOUR TIMES DAILY. 240 capsule 5  . temazepam (RESTORIL) 15 MG capsule Take 2 capsules (30 mg total) by mouth at bedtime. 60 capsule 4  . VITAMIN E PO Take 1 Units by mouth 2 (two) times daily.     Marland Kitchen zinc gluconate 50 MG tablet Take 50 mg by mouth daily.       No current facility-administered medications for this visit.     Neurologic: Headache: No Seizure: No Paresthesias:No  Musculoskeletal: Strength & Muscle Tone: within normal  limits Gait & Station: normal Patient leans: N/A  Psychiatric Specialty Exam: ROS  There were no vitals taken for this visit.There is no height or weight on file to calculate BMI.  General Appearance: Fairly Groomed  Eye Contact:  Good  Speech:  Clear and Coherent  Volume:  Normal  Mood:  {BHH MOOD:22306}  Affect:  {Affect (PAA):22687}  Thought Process:  Coherent  Orientation:  {BHH ORIENTATION (PAA):22689}  Thought Content:  Logical  Suicidal Thoughts:  {ST/HT (PAA):22692}  Homicidal Thoughts:  {ST/HT (PAA):22692}  Memory:  {BHH MEMORY:22881}  Judgement:  {Judgement (PAA):22694}  Insight:  {Insight (PAA):22695}  Psychomotor Activity:  Normal  Concentration:  {Concentration:21399}  Recall:  {BHH GOOD/FAIR/POOR:22877}  Fund of Knowledge:Good  Language: Good  Akathisia:  No  Handed:  Right  AIMS (if indicated):  N/A  Assets:  {Assets (PAA):22698}  ADL's:  {BHH FHQ'R:97588}  Cognition: {chl bhh cognition:304700322}  Sleep:  ***   Assessment  Plan  The patient demonstrates the following risk factors for suicide: Chronic risk factors for suicide include: {Chronic Risk Factors for TGPQDIY:64158309}. Acute risk factors for suicide include: {Acute Risk Factors for MMHWKGS:81103159}. Protective factors for this patient include: {Protective Factors for Suicide YVOP:92924462}. Considering these factors, the overall suicide risk at this point appears to be {Desc; low/moderate/high:110033}. Patient {ACTION; IS/IS MMN:81771165} appropriate for outpatient follow up.   Treatment Plan Summary: Plan as above   [name], MD 11/12/201910:55 AM

## 2017-12-25 ENCOUNTER — Other Ambulatory Visit: Payer: Self-pay | Admitting: Gastroenterology

## 2017-12-25 DIAGNOSIS — K219 Gastro-esophageal reflux disease without esophagitis: Secondary | ICD-10-CM

## 2017-12-30 ENCOUNTER — Ambulatory Visit (HOSPITAL_COMMUNITY): Payer: Medicare Other | Admitting: Psychiatry

## 2017-12-30 ENCOUNTER — Telehealth (HOSPITAL_COMMUNITY): Payer: Self-pay

## 2017-12-30 NOTE — Telephone Encounter (Signed)
Patient is transferring to Dr. Modesta Messing and was unfortunately late today for her appointment. She was rescheduled to Dec., but will need a refill on her Restoril

## 2017-12-31 ENCOUNTER — Telehealth (HOSPITAL_COMMUNITY): Payer: Self-pay | Admitting: Internal Medicine

## 2017-12-31 MED ORDER — TEMAZEPAM 15 MG PO CAPS: 30.0000 mg | ORAL_CAPSULE | Freq: Every day | ORAL | 0 refills | Status: DC

## 2017-12-31 NOTE — Telephone Encounter (Signed)
Yes  Give restoril as ordered

## 2017-12-31 NOTE — Addendum Note (Signed)
Addended by: Dennie Maizes E on: 12/31/2017 02:20 PM   Modules accepted: Orders

## 2018-01-01 ENCOUNTER — Ambulatory Visit (HOSPITAL_COMMUNITY): Payer: Self-pay | Admitting: Psychiatry

## 2018-01-20 ENCOUNTER — Other Ambulatory Visit: Payer: Self-pay | Admitting: Gastroenterology

## 2018-01-21 ENCOUNTER — Ambulatory Visit (INDEPENDENT_AMBULATORY_CARE_PROVIDER_SITE_OTHER): Payer: Medicare Other | Admitting: Psychiatry

## 2018-01-21 ENCOUNTER — Encounter (HOSPITAL_COMMUNITY): Payer: Self-pay | Admitting: Psychiatry

## 2018-01-21 VITALS — BP 172/78 | HR 106 | Ht 68.0 in | Wt 230.0 lb

## 2018-01-21 DIAGNOSIS — F330 Major depressive disorder, recurrent, mild: Secondary | ICD-10-CM | POA: Diagnosis not present

## 2018-01-21 DIAGNOSIS — R29818 Other symptoms and signs involving the nervous system: Secondary | ICD-10-CM

## 2018-01-21 DIAGNOSIS — R4189 Other symptoms and signs involving cognitive functions and awareness: Secondary | ICD-10-CM

## 2018-01-21 MED ORDER — TEMAZEPAM 15 MG PO CAPS: 30.0000 mg | ORAL_CAPSULE | Freq: Every day | ORAL | 1 refills | Status: DC

## 2018-01-21 NOTE — Patient Instructions (Addendum)
1. Continue Duloxetine 60 mg daily  2. Discontinue Abilfy   4. Continue gabapentin 400-800 mg 5. Continue temazepam 30 mg at night  6. Return to clinic in two months for 30 mins
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
## 2018-01-28 ENCOUNTER — Ambulatory Visit (HOSPITAL_COMMUNITY): Payer: Self-pay | Admitting: Psychiatry

## 2018-02-02 ENCOUNTER — Ambulatory Visit (HOSPITAL_COMMUNITY): Payer: Medicare Other | Admitting: Psychiatry

## 2018-02-18 ENCOUNTER — Telehealth (HOSPITAL_COMMUNITY): Payer: Self-pay | Admitting: *Deleted

## 2018-02-18 ENCOUNTER — Other Ambulatory Visit (HOSPITAL_COMMUNITY): Payer: Self-pay | Admitting: Psychiatry

## 2018-02-18 MED ORDER — GABAPENTIN 400 MG PO CAPS: ORAL_CAPSULE | ORAL | 0 refills | Status: DC

## 2018-02-18 NOTE — Telephone Encounter (Signed)
gabapentin sent in, restoril already sent by Dr. Modesta Messing

## 2018-02-18 NOTE — Telephone Encounter (Signed)
Dr Harrington Challenger  Dr Modesta Messing patient previously used to be seen by Dr. Casimiro Needle.   Will continue gabapentin for anxiety.  Will continue temazepam for insomnia; discussed risk of tenderness, fall and oversedation.   PATIENT requested refills
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
## 2018-03-17 ENCOUNTER — Ambulatory Visit (INDEPENDENT_AMBULATORY_CARE_PROVIDER_SITE_OTHER): Payer: Medicare Other | Admitting: Psychiatry

## 2018-03-17 ENCOUNTER — Encounter (HOSPITAL_COMMUNITY): Payer: Self-pay | Admitting: Psychiatry

## 2018-03-17 ENCOUNTER — Other Ambulatory Visit: Payer: Self-pay | Admitting: Gastroenterology

## 2018-03-17 ENCOUNTER — Other Ambulatory Visit (HOSPITAL_COMMUNITY): Payer: Self-pay | Admitting: Psychiatry

## 2018-03-17 VITALS — BP 136/70 | HR 96 | Ht 68.0 in | Wt 228.0 lb

## 2018-03-17 DIAGNOSIS — R29818 Other symptoms and signs involving the nervous system: Secondary | ICD-10-CM | POA: Diagnosis not present

## 2018-03-17 DIAGNOSIS — F330 Major depressive disorder, recurrent, mild: Secondary | ICD-10-CM

## 2018-03-17 DIAGNOSIS — R4189 Other symptoms and signs involving cognitive functions and awareness: Secondary | ICD-10-CM | POA: Diagnosis not present

## 2018-03-17 MED ORDER — GABAPENTIN 400 MG PO CAPS: ORAL_CAPSULE | ORAL | 2 refills | Status: DC

## 2018-03-17 MED ORDER — DULOXETINE HCL 60 MG PO CPEP: ORAL_CAPSULE | ORAL | 2 refills | Status: DC

## 2018-03-17 MED ORDER — TEMAZEPAM 15 MG PO CAPS: 30.0000 mg | ORAL_CAPSULE | Freq: Every day | ORAL | 2 refills | Status: DC

## 2018-03-17 NOTE — Patient Instructions (Signed)
1. Continue Duloxetine 60 mg daily  2. Continue donepezil 10 mg daily  3. Continue gabapentin 400-800 mg 4. Continue temazepam 30 mg at night  5. Return to clinic in two months for 30 mins 6. Referral to therapy

## 2018-03-18 ENCOUNTER — Encounter: Payer: Self-pay | Admitting: Gastroenterology

## 2018-03-18 ENCOUNTER — Ambulatory Visit (INDEPENDENT_AMBULATORY_CARE_PROVIDER_SITE_OTHER): Payer: Medicare Other | Admitting: Gastroenterology

## 2018-03-18 VITALS — BP 121/74 | HR 95 | Temp 97.2°F | Ht 68.0 in | Wt 228.0 lb

## 2018-03-18 DIAGNOSIS — K5080 Crohn's disease of both small and large intestine without complications: Secondary | ICD-10-CM

## 2018-03-18 NOTE — Progress Notes (Signed)
Referring Provider: Monico Blitz, MD Primary Care Physician:  Monico Blitz, MD Primary GI: Dr. Gala Romney   Chief Complaint  Patient presents with  . Crohn's Disease    HPI:   Sheri [name] is a 68 y.o. female presenting today with a history of ileocolonic Crohn's disease on Imuran and Pentasa.Due to rectal bleeding in May 2016, underwent updated colonoscopy. Notedfriable eroded mucosa at the anastomosis with small bowel, consistent with some stenosis Crohn's disease s/p biopsy. Colonic diverticulosis and external hemorrhoids. Pathology with benign anastomotic mucosa, no dysplasia.   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 Differentials including liver disease, hypothyroidism, MDS, drug effect. Has been on Imuran chronically and Pentasa. SPEP, LDH, TSH in normal range. Needs DEXA scan in Dec 2019.   Will have DEXA through PCP in near future. No rectal bleeding. Doing well without any significant flares. No abdominal pain.   Past Medical History:  Diagnosis Date  . Anemia   . Chronic back pain   . Chronic neck pain   . Complication of anesthesia   . Crohn disease ([place])   . Fibromyalgia   . GERD (gastroesophageal reflux disease)   . Hypertension   . Low back pain   . Macrocytosis without anemia 11/28/2017  . Osteopenia    Last DEXA 12/2010 was normal, on fosamax  . PONV (postoperative nausea and vomiting)   . Skin cancer    sees Dr. Tarri Glenn in [place]    Past Surgical History:  Procedure Laterality Date  . APPENDECTOMY  1990  . BREAST EXCISIONAL BIOPSY Right   . CATARACT EXTRACTION W/PHACO Left 01/13/2014   Procedure: CATARACT EXTRACTION PHACO AND INTRAOCULAR LENS PLACEMENT LEFT EYE;  Surgeon: Tonny Branch, MD;  Location: [facility];  Service: Ophthalmology;  Laterality: Left;  CDE:6.90  . CATARACT EXTRACTION W/PHACO Right 01/24/2014   Procedure: CATARACT EXTRACTION PHACO AND INTRAOCULAR LENS PLACEMENT (IOC);  Surgeon: Tonny Branch, MD;  Location: [facility];  Service:  Ophthalmology;  Laterality: Right;  CDE:5.91  . CESAREAN SECTION  [id number]  . COLONOSCOPY  09/11/2006  . COLONOSCOPY  10/30/2010   RMR: External and internal hemorrhoids, likely source of hematochezia  otherwise normal rectum/ Left-sided diverticula, status post prior right hemicolectomy with a friable, inflamed, stenotic surgical anastomosis with upstream dilation of the neoterminal ileum/ Crohn's noted, status post biopsy  . COLONOSCOPY N/A 06/23/2014   Dr. Rourk:friable eroded mucosa at the anastomosis with small bowel, consistent with some stenosis Crohn's disease s/p biopsy. Colonic diverticulosis and external hemorrhoids. Pathology with benign anastomotic mucosa, no dysplasia.   Marland Kitchen drainage of back abscess  2007  . ESOPHAGOGASTRODUODENOSCOPY  04/2009   noncritical schatzki's ring, small hh, sb bx negative  . ESOPHAGOGASTRODUODENOSCOPY (EGD) WITH PROPOFOL N/A 02/06/2017   Dr. Gala Romney: mild schatzki's ring at GE junction s/p dilation, small hiatal hernia, multiple 3 mm pedunculated and sessile polyps in gastric fundus, normal duodenum  . MALONEY DILATION N/A 02/06/2017   Procedure: Venia Minks DILATION;  Surgeon: Daneil Dolin, MD;  [facility];  Service: Endoscopy;  Laterality: N/A;  . POLYPECTOMY  02/06/2017   Procedure: POLYPECTOMY;  Surgeon: Daneil Dolin, MD;  [facility];  Service: Endoscopy;;  gastric  . SMALL INTESTINE SURGERY  [id number]    Current Outpatient Medications  Medication Sig Dispense Refill  . acetaminophen (TYLENOL) 500 MG tablet Take 1,000 mg by mouth every 6 (six) hours as needed for moderate pain or headache.    . azaTHIOprine (IMURAN) 50 MG  tablet TAKE 2&1/2 TABLETS DAILY 75 tablet 5  . Biotin 10 MG CAPS Take 20 mg by mouth daily.    . Calcium Carbonate-Vitamin D (CALCIUM 600+D PO) Take 1 capsule by mouth 3 (three) times daily.     . Cholecalciferol (EQL VITAMIN D3) 2000 units CAPS Take 2,000 Units by mouth daily.    . cyanocobalamin (,VITAMIN  B-12,) 1000 MCG/ML injection Inject 1,000 mcg into the muscle every 14 (fourteen) days.    . cyclobenzaprine (FLEXERIL) 10 MG tablet Take 10 mg by mouth 3 (three) times daily.     Marland Kitchen DEXILANT 60 MG capsule TAKE 1 CAPSULE BY MOUTH ONCE DAILY. 30 capsule 11  . donepezil (ARICEPT) 10 MG tablet Take 1/2 tablet daily for 2 weeks, then increase to 1 tablet daily 30 tablet 11  . DULoxetine (CYMBALTA) 60 MG capsule 1 qam 30 capsule 2  . ergocalciferol (VITAMIN D2) 50000 units capsule Take 50,000 Units by mouth every Sunday.    . gabapentin (NEURONTIN) 400 MG capsule 1  qam   2  qhs 90 capsule 2  . ketoconazole (NIZORAL) 2 % cream Apply 1 application topically daily as needed for irritation.    Javier Docker Oil 350 MG CAPS Take 350 mg by mouth daily.    Marland Kitchen lisinopril-hydrochlorothiazide (PRINZIDE,ZESTORETIC) 20-12.5 MG per tablet Take 1 tablet by mouth daily. 30 tablet 0  . Omega-3 Fatty Acids (FISH OIL PO) Take 1 capsule by mouth at bedtime.    Marland Kitchen oxyCODONE (ROXICODONE) 15 MG immediate release tablet Take 15 mg by mouth every 6 (six) hours as needed for pain.     Marland Kitchen PENTASA 500 MG CR capsule TAKE (2) CAPSULES FOUR TIMES DAILY. 240 capsule 11  . temazepam (RESTORIL) 15 MG capsule Take 2 capsules (30 mg total) by mouth at bedtime. 60 capsule 2  . VITAMIN E PO Take 1 Units by mouth 2 (two) times daily.     Marland Kitchen zinc gluconate 50 MG tablet Take 50 mg by mouth daily.      Marland Kitchen azaTHIOprine (IMURAN) 50 MG tablet TAKE 2 & 1/2 TABLETS DAILY. (Patient not taking: Reported on 03/18/2018) 75 tablet 11  . azaTHIOprine (IMURAN) 50 MG tablet TAKE 2 & 1/2 TABLET DAILY. (Patient not taking: Reported on 03/18/2018) 75 tablet 5  . azaTHIOprine (IMURAN) 50 MG tablet TAKE 2 & 1/2 TABLET DAILY. (Patient not taking: Reported on 03/18/2018) 75 tablet 3   No current facility-administered medications for this visit.     Allergies as of 03/18/2018 - Review Complete 03/18/2018  Allergen Reaction Noted  . Codeine sulfate Nausea Only     Family  History  Problem Relation Age of Onset  . Crohn's disease Mother        succumbed to stomach cancer in her 63s  . Stomach cancer Mother   . Heart attack Father   . COPD Father   . Colon cancer Neg Hx     Social History   Socioeconomic History  . Marital status: Legally Separated    Spouse name: Not on file  . Number of children: 2  . Years of education: 57  . Highest education level: 12th grade  Occupational History  . Occupation: disability    Employer: NOT EMPLOYED  Social Needs  . Financial resource strain: Not very hard  . Food insecurity:    Worry: Never true    Inability: Never true  . Transportation needs:    Medical: No    Non-medical: No  Tobacco Use  .  Smoking status: Never Smoker  . Smokeless tobacco: Never Used  Substance and Sexual Activity  . Alcohol use: No  . Drug use: No  . Sexual activity: Yes    Birth control/protection: None  Lifestyle  . Physical activity:    Days per week: 0 days    Minutes per session: 0 min  . Stress: To some extent  Relationships  . Social connections:    Talks on phone: More than three times a week    Gets together: More than three times a week    Attends religious service: Never    Active member of club or organization: No    Attends meetings of clubs or organizations: Never    Relationship status: Separated  Other Topics Concern  . Not on file  Social History Narrative  . Not on file    Review of Systems: As mentioned in HPI  Physical Exam: BP 121/74   Pulse 95   Temp (!) 97.2 F (36.2 C) (Oral)   Ht 5' 8"  (1.727 m)   Wt 228 lb (103.4 kg)   BMI 34.67 kg/m  General:   Alert and oriented. No distress noted. Pleasant and cooperative.  Head:  Normocephalic and atraumatic. Eyes:  Conjuctiva clear without scleral icterus. Mouth:  Oral mucosa pink and moist.  Abdomen:  +BS, soft, non-tender and non-distended. No rebound or guarding. No HSM or masses noted. Msk:  Symmetrical without gross deformities. Normal  posture. Extremities:  Without edema. Neurologic:  Alert and  oriented x4 Psych:  Alert and cooperative. Normal mood and affect.  Lab Results  Component Value Date   WBC 6.0 11/21/2017   HGB 12.3 11/21/2017   HCT 36.3 11/21/2017   MCV 101.1 (H) 11/21/2017   PLT 190 11/21/2017   Lab Results  Component Value Date   ALT 25 11/21/2017   AST 35 11/21/2017   ALKPHOS 99 11/21/2017   BILITOT 1.7 (H) 11/21/2017   Lab Results  Component Value Date   CREATININE 0.64 11/21/2017   BUN 12 11/21/2017   NA 139 11/21/2017   K 3.6 11/21/2017   CL 106 11/21/2017   CO2 24 11/21/2017

## 2018-03-18 NOTE — Patient Instructions (Signed)
I am so glad you are doing well!  It's always a joy to see you.  I will see you in 6 months!  I enjoyed seeing you again today! As you know, I value our relationship and want to provide genuine, compassionate, and quality care. I welcome your feedback. If you receive a survey regarding your visit,  I greatly appreciate you taking time to fill this out. See you next time!  Annitta Needs, PhD, ANP-BC [facility]

## 2018-03-23 NOTE — Assessment & Plan Note (Signed)
Doing well with Imuran and Pentasa. CBC and CMP reviewed from several months ago. Renal function remains preserved. CBC without any significant findings other than chronic macrocytosis, which has been evaluated by Hematology. Discussed need for DEXA scan, which her PCP will be ordering. Will have her return in 6 months. Will need to determine timing of next colonoscopy, as last was in 2016.

## 2018-03-24 NOTE — Progress Notes (Signed)
CC'D TO PCP °

## 2018-04-15 ENCOUNTER — Other Ambulatory Visit: Payer: Self-pay | Admitting: Gastroenterology

## 2018-04-22 ENCOUNTER — Other Ambulatory Visit: Payer: Self-pay

## 2018-04-22 ENCOUNTER — Ambulatory Visit (INDEPENDENT_AMBULATORY_CARE_PROVIDER_SITE_OTHER): Payer: Medicare Other | Admitting: Psychiatry

## 2018-04-22 ENCOUNTER — Encounter (HOSPITAL_COMMUNITY): Payer: Self-pay | Admitting: Psychiatry

## 2018-04-22 DIAGNOSIS — F330 Major depressive disorder, recurrent, mild: Secondary | ICD-10-CM | POA: Diagnosis not present
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
## 2018-05-18 ENCOUNTER — Ambulatory Visit (INDEPENDENT_AMBULATORY_CARE_PROVIDER_SITE_OTHER): Payer: Medicare Other | Admitting: Psychiatry

## 2018-05-18 ENCOUNTER — Encounter (HOSPITAL_COMMUNITY): Payer: Self-pay | Admitting: Psychiatry

## 2018-05-18 ENCOUNTER — Other Ambulatory Visit: Payer: Self-pay

## 2018-05-18 DIAGNOSIS — Z79899 Other long term (current) drug therapy: Secondary | ICD-10-CM

## 2018-05-18 DIAGNOSIS — R4189 Other symptoms and signs involving cognitive functions and awareness: Secondary | ICD-10-CM

## 2018-05-18 DIAGNOSIS — R29818 Other symptoms and signs involving the nervous system: Secondary | ICD-10-CM | POA: Diagnosis not present

## 2018-05-18 DIAGNOSIS — F330 Major depressive disorder, recurrent, mild: Secondary | ICD-10-CM | POA: Diagnosis not present

## 2018-05-18 MED ORDER — MIRTAZAPINE 15 MG PO TABS: 7.5000 mg | ORAL_TABLET | Freq: Every day | ORAL | 0 refills | Status: DC

## 2018-05-18 MED ORDER — GABAPENTIN 400 MG PO CAPS: ORAL_CAPSULE | ORAL | 1 refills | Status: DC

## 2018-05-18 MED ORDER — DULOXETINE HCL 60 MG PO CPEP: ORAL_CAPSULE | ORAL | 1 refills | Status: DC

## 2018-05-18 NOTE — Patient Instructions (Signed)
1. Continue Duloxetine 60 mg daily  2. Continue donepezil 10 mg daily  3. Continue gabapentin 400-800 mg 4. Continue temazepam 30 mg at night  5. Start mirtazapine 7.5 mg at night  5.Return to clinic, 6/4 at 1 PM for 30 mins

## 2018-05-27 ENCOUNTER — Inpatient Hospital Stay (HOSPITAL_COMMUNITY): Payer: Medicare Other | Attending: Hematology

## 2018-05-27 ENCOUNTER — Other Ambulatory Visit: Payer: Self-pay

## 2018-05-27 DIAGNOSIS — K5080 Crohn's disease of both small and large intestine without complications: Secondary | ICD-10-CM | POA: Insufficient documentation

## 2018-05-27 DIAGNOSIS — D7589 Other specified diseases of blood and blood-forming organs: Secondary | ICD-10-CM | POA: Insufficient documentation

## 2018-05-27 DIAGNOSIS — I10 Essential (primary) hypertension: Secondary | ICD-10-CM | POA: Insufficient documentation

## 2018-05-27 DIAGNOSIS — D61818 Other pancytopenia: Secondary | ICD-10-CM | POA: Insufficient documentation

## 2018-05-27 DIAGNOSIS — E538 Deficiency of other specified B group vitamins: Secondary | ICD-10-CM | POA: Insufficient documentation

## 2018-05-27 DIAGNOSIS — Z87891 Personal history of nicotine dependence: Secondary | ICD-10-CM | POA: Diagnosis not present

## 2018-05-27 LAB — CBC WITH DIFFERENTIAL/PLATELET
Abs Immature Granulocytes: 0.04 10*3/uL (ref 0.00–0.07)
Basophils Absolute: 0 10*3/uL (ref 0.0–0.1)
Basophils Relative: 1 %
Eosinophils Absolute: 0.2 10*3/uL (ref 0.0–0.5)
Eosinophils Relative: 3 %
HCT: 38.4 % (ref 36.0–46.0)
Hemoglobin: 13.1 g/dL (ref 12.0–15.0)
Immature Granulocytes: 1 %
Lymphocytes Relative: 32 %
Lymphs Abs: 2 10*3/uL (ref 0.7–4.0)
MCH: 35 pg — ABNORMAL HIGH (ref 26.0–34.0)
MCHC: 34.1 g/dL (ref 30.0–36.0)
MCV: 102.7 fL — ABNORMAL HIGH (ref 80.0–100.0)
Monocytes Absolute: 0.5 10*3/uL (ref 0.1–1.0)
Monocytes Relative: 9 %
Neutro Abs: 3.4 10*3/uL (ref 1.7–7.7)
Neutrophils Relative %: 54 %
Platelets: 226 10*3/uL (ref 150–400)
RBC: 3.74 MIL/uL — ABNORMAL LOW (ref 3.87–5.11)
RDW: 13.6 % (ref 11.5–15.5)
WBC: 6.1 10*3/uL (ref 4.0–10.5)
nRBC: 0 % (ref 0.0–0.2)

## 2018-05-28 LAB — PATHOLOGIST SMEAR REVIEW

## 2018-06-01 ENCOUNTER — Other Ambulatory Visit: Payer: Self-pay

## 2018-06-01 ENCOUNTER — Ambulatory Visit (HOSPITAL_COMMUNITY): Payer: Medicare Other | Admitting: Psychiatry

## 2018-06-03 ENCOUNTER — Other Ambulatory Visit: Payer: Self-pay

## 2018-06-03 ENCOUNTER — Inpatient Hospital Stay (HOSPITAL_BASED_OUTPATIENT_CLINIC_OR_DEPARTMENT_OTHER): Payer: Medicare Other | Admitting: Nurse Practitioner

## 2018-06-03 DIAGNOSIS — E538 Deficiency of other specified B group vitamins: Secondary | ICD-10-CM

## 2018-06-03 DIAGNOSIS — D7589 Other specified diseases of blood and blood-forming organs: Secondary | ICD-10-CM

## 2018-06-03 NOTE — Progress Notes (Signed)
Virtual Visit via Telephone Note  I connected with [name] on 06/03/18 at 1050 AM EDT by telephone and verified that I am speaking with the correct person using two identifiers.   I discussed the limitations, risks, security and privacy concerns of performing an evaluation and management service by telephone and the availability of in person appointments. I also discussed with the patient that there may be a patient responsible charge related to this service. The patient expressed understanding and agreed to proceed.   History of Present Illness: - Patient was called via telephone for her appointment today.  Patient is fatigued but feels it is more due to her depression.  She is trying to remain active and get enough sunlight.  She has no further complaints at this time. Denies any nausea, vomiting, or diarrhea. Denies any new pains. Had not noticed any recent bleeding such as epistaxis, hematuria or hematochezia. Denies recent chest pain on exertion, shortness of breath on minimal exertion, pre-syncopal episodes, or palpitations. Denies any numbness or tingling in hands or feet. Denies any recent fevers, infections, or recent hospitalizations.  She is eating well and maintaining her weight at this time.    Observations/Objective: -Patient appeared alert and oriented on the phone. - Physical assessment deferred due to telephone visit  Assessment and Plan: 1.  Macrocytosis without anemia: -Likely secondary medication versus hepatic steatosis, with negative peripheral hematology work-up. -Medications include Pentasa and Imuran. -Patient denies any bleeding or dark stools.  She denies any easy bruising, fatigue, night sweats or unexplained weight loss. -Patient had labs done on 05/27/2018 hemoglobin was 13.1, MVC was 102.7, WBC was 6.1, and platelets are 226. -Patient will follow-up in 6 months with repeat labs.  Follow Up Instructions: -Patient will follow-up in 6 months with repeat  labs. -Patient has a follow-up with her GI doctor that she will keep to schedule her next colonoscopy. -Patient will keep her follow-up appointments with her psychiatric doctor as well.   I discussed the assessment and treatment plan with the patient. The patient was provided an opportunity to ask questions and all were answered. The patient agreed with the plan and demonstrated an understanding of the instructions.   The patient was advised to call back or seek an in-person evaluation if the symptoms worsen or if the condition fails to improve as anticipated.  I provided 20 minutes of non-face-to-face time during this encounter.   Glennie Isle, NP-C

## 2018-06-03 NOTE — Addendum Note (Signed)
Addended by: Glennie Isle on: 06/03/2018 11:05 AM   Modules accepted: Orders

## 2018-06-12 ENCOUNTER — Telehealth (HOSPITAL_COMMUNITY): Payer: Self-pay | Admitting: *Deleted

## 2018-06-12 NOTE — Telephone Encounter (Signed)
Dr Alvino Chapel with patient & informed that you : advise her that I would advise her to stay on medication unless it causes any side effect. It would take at least a month for medication to be effective. Also, we can try higher dose in the future to see if it is more effective after trying the current dose for a while.  Patient said Ok she will continue medication & would like to discuss further on 07/16/2018 visit if changes are needed

## 2018-06-12 NOTE — Telephone Encounter (Signed)
Dr Modesta Messing Patient transferred after making appointment with front office  & she stated that she has tried the Remeron   Route: Take 0.5 tablets (7.5 mg total) by mouth at bedtime for the last 3 weeks  & that it isn't helping .And she's going to stop taking

## 2018-06-12 NOTE — Telephone Encounter (Signed)
Please advise her that I would advise her to stay on medication unless it causes any side effect. It would take at least a month for medication to be effective. Also, we can try higher dose in the future to see if it is more effective after trying the current dose for a while.

## 2018-06-15 ENCOUNTER — Other Ambulatory Visit: Payer: Self-pay

## 2018-06-15 ENCOUNTER — Ambulatory Visit (INDEPENDENT_AMBULATORY_CARE_PROVIDER_SITE_OTHER): Payer: Medicaid Other | Admitting: Psychiatry

## 2018-06-15 DIAGNOSIS — F330 Major depressive disorder, recurrent, mild: Secondary | ICD-10-CM | POA: Diagnosis not present
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
## 2018-06-24 ENCOUNTER — Ambulatory Visit (HOSPITAL_COMMUNITY): Payer: Medicare Other | Admitting: Psychiatry

## 2018-06-24 ENCOUNTER — Other Ambulatory Visit: Payer: Self-pay

## 2018-06-29 ENCOUNTER — Ambulatory Visit (HOSPITAL_COMMUNITY): Payer: Medicare Other | Admitting: Psychiatry

## 2018-06-29 ENCOUNTER — Ambulatory Visit: Payer: Self-pay | Admitting: Neurology

## 2018-06-30 ENCOUNTER — Encounter

## 2018-06-30 ENCOUNTER — Ambulatory Visit: Payer: Self-pay | Admitting: Neurology

## 2018-07-01 ENCOUNTER — Other Ambulatory Visit: Payer: Self-pay

## 2018-07-01 ENCOUNTER — Telehealth (INDEPENDENT_AMBULATORY_CARE_PROVIDER_SITE_OTHER): Payer: Medicare Other | Admitting: Neurology

## 2018-07-01 DIAGNOSIS — G3184 Mild cognitive impairment, so stated: Secondary | ICD-10-CM

## 2018-07-01 MED ORDER — DONEPEZIL HCL 10 MG PO TABS: ORAL_TABLET | ORAL | 3 refills | Status: DC
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
## 2018-07-02 ENCOUNTER — Ambulatory Visit (INDEPENDENT_AMBULATORY_CARE_PROVIDER_SITE_OTHER): Payer: Medicaid Other | Admitting: Psychiatry

## 2018-07-02 DIAGNOSIS — F330 Major depressive disorder, recurrent, mild: Secondary | ICD-10-CM
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
## 2018-07-03 ENCOUNTER — Ambulatory Visit: Payer: Self-pay | Admitting: Neurology
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
## 2018-07-16 ENCOUNTER — Other Ambulatory Visit: Payer: Self-pay

## 2018-07-16 ENCOUNTER — Ambulatory Visit (INDEPENDENT_AMBULATORY_CARE_PROVIDER_SITE_OTHER): Payer: Medicaid Other | Admitting: Psychiatry

## 2018-07-16 ENCOUNTER — Other Ambulatory Visit: Payer: Self-pay | Admitting: Gastroenterology

## 2018-07-16 ENCOUNTER — Encounter (HOSPITAL_COMMUNITY): Payer: Self-pay | Admitting: Psychiatry

## 2018-07-16 DIAGNOSIS — F330 Major depressive disorder, recurrent, mild: Secondary | ICD-10-CM | POA: Diagnosis not present

## 2018-07-16 DIAGNOSIS — R29818 Other symptoms and signs involving the nervous system: Secondary | ICD-10-CM

## 2018-07-16 DIAGNOSIS — R4189 Other symptoms and signs involving cognitive functions and awareness: Secondary | ICD-10-CM

## 2018-07-16 MED ORDER — TEMAZEPAM 15 MG PO CAPS: 30.0000 mg | ORAL_CAPSULE | Freq: Every day | ORAL | 1 refills | Status: DC

## 2018-07-16 MED ORDER — GABAPENTIN 400 MG PO CAPS: ORAL_CAPSULE | ORAL | 0 refills | Status: DC

## 2018-07-16 MED ORDER — DULOXETINE HCL 60 MG PO CPEP: ORAL_CAPSULE | ORAL | 0 refills | Status: DC

## 2018-07-16 NOTE — Patient Instructions (Signed)
1. Continue Duloxetine 60 mg daily 2. Continue donepezil 10 mg daily - prescribed by neurologist 3. Continue gabapentin 400-800 mg 4. Continue temazepam 30 mg at night(she declined refill)  - Hold mirtazapine 5.Next appointment, 7/30 at 2 PM

## 2018-07-22 ENCOUNTER — Ambulatory Visit (HOSPITAL_COMMUNITY): Payer: Medicare Other | Admitting: Psychiatry

## 2018-08-05 ENCOUNTER — Ambulatory Visit (HOSPITAL_COMMUNITY): Payer: Medicare Other | Admitting: Psychiatry

## 2018-08-17 ENCOUNTER — Other Ambulatory Visit: Payer: Self-pay | Admitting: Gastroenterology

## 2018-08-20 ENCOUNTER — Ambulatory Visit (HOSPITAL_COMMUNITY): Payer: Medicare Other | Admitting: Psychiatry
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
## 2018-09-03 ENCOUNTER — Other Ambulatory Visit: Payer: Self-pay

## 2018-09-03 ENCOUNTER — Ambulatory Visit (HOSPITAL_COMMUNITY): Payer: Medicare Other | Admitting: Psychiatry

## 2018-09-10 ENCOUNTER — Telehealth (HOSPITAL_COMMUNITY): Payer: Self-pay | Admitting: Psychiatry

## 2018-09-10 ENCOUNTER — Ambulatory Visit (HOSPITAL_COMMUNITY): Payer: Medicare Other | Admitting: Psychiatry

## 2018-09-10 ENCOUNTER — Other Ambulatory Visit: Payer: Self-pay

## 2018-09-10 NOTE — Telephone Encounter (Signed)
Sent link for video visit through [facility]. Patient did not sign in. Called the patient  twice for appointment scheduled today. The patient did not answer the phone. Left voice message to contact the office.  

## 2018-09-14 ENCOUNTER — Telehealth (HOSPITAL_COMMUNITY): Payer: Self-pay | Admitting: Psychiatry

## 2018-09-14 NOTE — Telephone Encounter (Signed)
Received a refill request of Temazepam. I believe she has refill ordered in June for two months (which she has not picked up yet). Please verify this, and also advise the patient to make follow up appointment, and notify of attendance policy. Will not plan to prescribe any refill without evaluation.

## 2018-09-14 NOTE — Telephone Encounter (Signed)
LVM PER  PROVIDER :  Received a refill request of Temazepam. I believe she has refill ordered in June for two months (which she has not picked up yet). Please verify this, and also advise the patient to make follow up appointment, and notify of attendance policy. Will not plan to prescribe any refill without evaluation

## 2018-09-23 ENCOUNTER — Other Ambulatory Visit: Payer: Self-pay

## 2018-09-23 ENCOUNTER — Ambulatory Visit (INDEPENDENT_AMBULATORY_CARE_PROVIDER_SITE_OTHER): Payer: Medicare Other | Admitting: Gastroenterology

## 2018-09-23 ENCOUNTER — Encounter: Payer: Self-pay | Admitting: Gastroenterology

## 2018-09-23 VITALS — BP 128/76 | HR 94 | Temp 97.3°F | Ht 68.0 in | Wt 216.4 lb

## 2018-09-23 DIAGNOSIS — K5080 Crohn's disease of both small and large intestine without complications: Secondary | ICD-10-CM

## 2018-09-23 MED ORDER — MESALAMINE ER 500 MG PO CPCR: ORAL_CAPSULE | ORAL | 11 refills | Status: DC

## 2018-09-23 NOTE — Progress Notes (Signed)
Referring Provider: Monico Blitz, MD Primary Care Physician:  Monico Blitz, MD Primary GI: Dr. Gala Romney   Chief Complaint  Patient presents with  . Crohn's Disease    HPI:   MICHA ERCK is a 68 y.o. female presenting today with a history of ileocolonic Crohn's disease on Imuran and Pentasa.Due to rectal bleeding in May 2016, underwent updated colonoscopy. Notedfriable eroded mucosa at the anastomosis with small bowel, consistent with some stenosis Crohn's disease s/p biopsy. Colonic diverticulosis and external hemorrhoids. Pathology with benign anastomotic mucosa, no dysplasia.   Returns today for routine follow-up. Followed by Hematology for macrocytosis. No abdominal pain, N/V, change in bowel habits, constipation, diarrhea, rectal bleeding. Remains on Imuran and Pentasa. Recent labs on file from April 2020. Renal function stable. Overdue for DEXA but would like to arrange this through her PCP.   Past Medical History:  Diagnosis Date  . Anemia   . Chronic back pain   . Chronic neck pain   . Complication of anesthesia   . Crohn disease ([place])   . Fibromyalgia   . GERD (gastroesophageal reflux disease)   . Hypertension   . Low back pain   . Macrocytosis without anemia 11/28/2017  . Osteopenia    Last DEXA 12/2010 was normal, on fosamax  . PONV (postoperative nausea and vomiting)   . Skin cancer    sees Dr. Tarri Glenn in [place]    Past Surgical History:  Procedure Laterality Date  . APPENDECTOMY  1990  . BREAST EXCISIONAL BIOPSY Right   . CATARACT EXTRACTION W/PHACO Left 01/13/2014   Procedure: CATARACT EXTRACTION PHACO AND INTRAOCULAR LENS PLACEMENT LEFT EYE;  Surgeon: Tonny Branch, MD;  Location: [facility];  Service: Ophthalmology;  Laterality: Left;  CDE:6.90  . CATARACT EXTRACTION W/PHACO Right 01/24/2014   Procedure: CATARACT EXTRACTION PHACO AND INTRAOCULAR LENS PLACEMENT (IOC);  Surgeon: Tonny Branch, MD;  Location: [facility];  Service: Ophthalmology;  Laterality: Right;   CDE:5.91  . CESAREAN SECTION  [id number]  . COLONOSCOPY  09/11/2006  . COLONOSCOPY  10/30/2010   RMR: External and internal hemorrhoids, likely source of hematochezia  otherwise normal rectum/ Left-sided diverticula, status post prior right hemicolectomy with a friable, inflamed, stenotic surgical anastomosis with upstream dilation of the neoterminal ileum/ Crohn's noted, status post biopsy  . COLONOSCOPY N/A 06/23/2014   Dr. Rourk:friable eroded mucosa at the anastomosis with small bowel, consistent with some stenosis Crohn's disease s/p biopsy. Colonic diverticulosis and external hemorrhoids. Pathology with benign anastomotic mucosa, no dysplasia.   Marland Kitchen drainage of back abscess  2007  . ESOPHAGOGASTRODUODENOSCOPY  04/2009   noncritical schatzki's ring, small hh, sb bx negative  . ESOPHAGOGASTRODUODENOSCOPY (EGD) WITH PROPOFOL N/A 02/06/2017   Dr. Gala Romney: mild schatzki's ring at GE junction s/p dilation, small hiatal hernia, multiple 3 mm pedunculated and sessile polyps in gastric fundus, normal duodenum  . MALONEY DILATION N/A 02/06/2017   Procedure: Venia Minks DILATION;  Surgeon: Daneil Dolin, MD;  Location: [facility];  Service: Endoscopy;  Laterality: N/A;  . POLYPECTOMY  02/06/2017   Procedure: POLYPECTOMY;  Surgeon: Daneil Dolin, MD;  Location: [facility];  Service: Endoscopy;;  gastric  . SMALL INTESTINE SURGERY  [id number]    Current Outpatient Medications  Medication Sig Dispense Refill  . acetaminophen (TYLENOL) 500 MG tablet Take 1,000 mg by mouth every 6 (six) hours as needed for moderate pain or headache.    . azaTHIOprine (IMURAN) 50 MG tablet TAKE 2 AND 1/2 TABLETS ONCE  A DAY. 75 tablet 2  . Biotin 10 MG CAPS Take 20 mg by mouth daily.    . Calcium Carbonate-Vitamin D (CALCIUM 600+D PO) Take 1 capsule by mouth 3 (three) times daily.     . Cholecalciferol (EQL VITAMIN D3) 2000 units CAPS Take 2,000 Units by mouth once a week.     . cyanocobalamin (,VITAMIN B-12,) 1000 MCG/ML  injection Inject 1,000 mcg into the muscle every 14 (fourteen) days.    . cyclobenzaprine (FLEXERIL) 10 MG tablet Take 10 mg by mouth 3 (three) times daily.     Marland Kitchen DEXILANT 60 MG capsule TAKE 1 CAPSULE BY MOUTH ONCE DAILY. 30 capsule 11  . donepezil (ARICEPT) 10 MG tablet Take 1 tablet daily 90 tablet 3  . DULoxetine (CYMBALTA) 60 MG capsule 1 qam 90 capsule 0  . ergocalciferol (VITAMIN D2) 50000 units capsule Take 50,000 Units by mouth every Sunday.    . gabapentin (NEURONTIN) 400 MG capsule 1  qam   2  qhs 270 capsule 0  . hydrocortisone 2.5 % cream as needed.     Marland Kitchen ketoconazole (NIZORAL) 2 % cream Apply 1 application topically daily as needed for irritation.    Javier Docker Oil 350 MG CAPS Take 350 mg by mouth daily.    Marland Kitchen lisinopril-hydrochlorothiazide (PRINZIDE,ZESTORETIC) 20-12.5 MG per tablet Take 1 tablet by mouth daily. 30 tablet 0  . Omega-3 Fatty Acids (FISH OIL PO) Take 1 capsule by mouth at bedtime.    Marland Kitchen oxyCODONE (ROXICODONE) 15 MG immediate release tablet Take 15 mg by mouth every 6 (six) hours as needed for pain.     Marland Kitchen PENTASA 500 MG CR capsule TAKE (2) CAPSULES FOUR TIMES DAILY. 240 capsule 11  . temazepam (RESTORIL) 15 MG capsule Take 2 capsules (30 mg total) by mouth at bedtime. 60 capsule 1  . VITAMIN E PO Take 1 Units by mouth 2 (two) times daily.     Marland Kitchen zinc gluconate 50 MG tablet Take 50 mg by mouth daily.       No current facility-administered medications for this visit.     Allergies as of 09/23/2018 - Review Complete 09/23/2018  Allergen Reaction Noted  . Codeine sulfate Nausea Only     Family History  Problem Relation Age of Onset  . Crohn's disease Mother        succumbed to stomach cancer in her 80s  . Stomach cancer Mother   . Heart attack Father   . COPD Father   . Colon cancer Neg Hx     Social History   Socioeconomic History  . Marital status: Legally Separated    Spouse name: Not on file  . Number of children: 2  . Years of education: 50  . Highest  education level: 12th grade  Occupational History  . Occupation: disability    Employer: NOT EMPLOYED  Social Needs  . Financial resource strain: Not very hard  . Food insecurity    Worry: Never true    Inability: Never true  . Transportation needs    Medical: No    Non-medical: No  Tobacco Use  . Smoking status: Never Smoker  . Smokeless tobacco: Never Used  Substance and Sexual Activity  . Alcohol use: No  . Drug use: No  . Sexual activity: Yes    Birth control/protection: Post-menopausal  Lifestyle  . Physical activity    Days per week: 0 days    Minutes per session: 0 min  . Stress: To some  extent  Relationships  . Social connections    Talks on phone: More than three times a week    Gets together: More than three times a week    Attends religious service: Never    Active member of club or organization: No    Attends meetings of clubs or organizations: Never    Relationship status: Separated  Other Topics Concern  . Not on file  Social History Narrative  . Not on file    Review of Systems: Gen: Denies fever, chills, anorexia. Denies fatigue, weakness, weight loss.  CV: Denies chest pain, palpitations, syncope, peripheral edema, and claudication. Resp: Denies dyspnea at rest, cough, wheezing, coughing up blood, and pleurisy. GI: see HPI Derm: Denies rash, itching, dry skin Psych: Denies depression, anxiety, memory loss, confusion. No homicidal or suicidal ideation.  Heme: Denies bruising, bleeding, and enlarged lymph nodes.  Physical Exam: BP 128/76   Pulse 94   Temp (!) 97.3 F (36.3 C) (Temporal)   Ht 5' 8"  (1.727 m)   Wt 216 lb 6.4 oz (98.2 kg)   BMI 32.90 kg/m  General:   Alert and oriented. No distress noted. Pleasant and cooperative.  Head:  Normocephalic and atraumatic. Eyes:  Conjuctiva clear without scleral icterus. Mouth:  Oral mucosa pink and moist.  Abdomen:  +BS, soft, non-tender and non-distended. No rebound or guarding. No HSM or masses  noted. Msk:  Symmetrical without gross deformities. Normal posture. Extremities:  Without edema. Neurologic:  Alert and  oriented x4 Psych:  Alert and cooperative. Normal mood and affect.

## 2018-09-23 NOTE — Patient Instructions (Signed)
It was great to see you today, as always!  Please make sure to get the bone scan done when you see Dr. Manuella Ghazi.  We will see you in 6-8 months.  Stay safe!  I enjoyed seeing you again today! As you know, I value our relationship and want to provide genuine, compassionate, and quality care. I welcome your feedback. If you receive a survey regarding your visit,  I greatly appreciate you taking time to fill this out. See you next time!  Annitta Needs, PhD, ANP-BC [facility]

## 2018-09-24 ENCOUNTER — Encounter: Payer: Self-pay | Admitting: Internal Medicine

## 2018-09-24 NOTE — Assessment & Plan Note (Signed)
68 year old female in clinical remission on Imuran and Pentasa, with renal function remaining well preserved. 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 Needs DEXA scan, which she would like to arrange through her PCP. We will see her in 6-8 months for routine follow-up.

## 2018-09-25 ENCOUNTER — Ambulatory Visit (HOSPITAL_COMMUNITY): Payer: Medicare Other | Admitting: Psychiatry

## 2018-09-28 ENCOUNTER — Telehealth (HOSPITAL_COMMUNITY): Payer: Self-pay | Admitting: Psychiatry

## 2018-09-28 ENCOUNTER — Ambulatory Visit (HOSPITAL_COMMUNITY): Payer: Medicare Other | Admitting: Psychiatry

## 2018-09-28 ENCOUNTER — Other Ambulatory Visit: Payer: Self-pay

## 2018-09-28 NOTE — Telephone Encounter (Signed)
Sent link for video visit through [facility]. Patient did not sign in. Called the patient  twice for appointment scheduled today. The patient did not answer the phone. Left voice message to contact the office.  

## 2018-09-29 NOTE — Progress Notes (Signed)
cc'ed to pcp °

## 2018-10-01 ENCOUNTER — Telehealth: Payer: Self-pay | Admitting: Internal Medicine

## 2018-10-01 NOTE — Telephone Encounter (Signed)
[phone] patient called and asked if her prior auth came over from [employer].  Her pentasa is almost out and it needs authorization

## 2018-10-02 NOTE — Telephone Encounter (Signed)
Spoke with pt. Her insurance company was called and I was asked to do a PA through [email or web address]. waiting on an approval or denial.

## 2018-10-06 NOTE — Telephone Encounter (Signed)
PA was approved 07-04-2018 - 10/02/2019. Approval letter scanned in chart. Spoke with pt and she also received an approval letter from [employer] today.

## 2018-10-08 ENCOUNTER — Other Ambulatory Visit: Payer: Self-pay

## 2018-10-08 ENCOUNTER — Ambulatory Visit (INDEPENDENT_AMBULATORY_CARE_PROVIDER_SITE_OTHER): Payer: Medicaid Other | Admitting: Psychiatry

## 2018-10-08 DIAGNOSIS — F330 Major depressive disorder, recurrent, mild: Secondary | ICD-10-CM | POA: Diagnosis not present
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
## 2018-10-16 ENCOUNTER — Other Ambulatory Visit: Payer: Self-pay | Admitting: Neurology

## 2018-10-30 ENCOUNTER — Other Ambulatory Visit: Payer: Self-pay

## 2018-10-30 ENCOUNTER — Ambulatory Visit (INDEPENDENT_AMBULATORY_CARE_PROVIDER_SITE_OTHER): Payer: Medicaid Other | Admitting: Psychiatry

## 2018-10-30 DIAGNOSIS — F330 Major depressive disorder, recurrent, mild: Secondary | ICD-10-CM | POA: Diagnosis not present
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
## 2018-11-02 ENCOUNTER — Other Ambulatory Visit (HOSPITAL_COMMUNITY): Payer: Self-pay | Admitting: Psychiatry
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
## 2018-11-13 ENCOUNTER — Other Ambulatory Visit: Payer: Self-pay

## 2018-11-13 ENCOUNTER — Ambulatory Visit (INDEPENDENT_AMBULATORY_CARE_PROVIDER_SITE_OTHER): Payer: Medicare Other | Admitting: Psychiatry

## 2018-11-13 ENCOUNTER — Encounter (HOSPITAL_COMMUNITY): Payer: Self-pay | Admitting: Psychiatry

## 2018-11-13 ENCOUNTER — Encounter

## 2018-11-13 DIAGNOSIS — F330 Major depressive disorder, recurrent, mild: Secondary | ICD-10-CM | POA: Diagnosis not present

## 2018-11-13 MED ORDER — TEMAZEPAM 15 MG PO CAPS: 30.0000 mg | ORAL_CAPSULE | Freq: Every day | ORAL | 3 refills | Status: DC

## 2018-11-13 MED ORDER — GABAPENTIN 400 MG PO CAPS: ORAL_CAPSULE | ORAL | 1 refills | Status: DC

## 2018-11-13 MED ORDER — DULOXETINE HCL 60 MG PO CPEP: ORAL_CAPSULE | ORAL | 1 refills | Status: DC

## 2018-11-13 NOTE — Patient Instructions (Signed)
1. Continue Duloxetine 60 mg daily 2  Continue gabapentin 400-800 mg 3. Continue temazepam 30 mg at night 4.Next appointment: in 4 months

## 2018-11-17 ENCOUNTER — Other Ambulatory Visit: Payer: Self-pay | Admitting: Gastroenterology

## 2018-11-17 DIAGNOSIS — K219 Gastro-esophageal reflux disease without esophagitis: Secondary | ICD-10-CM

## 2018-11-20 ENCOUNTER — Ambulatory Visit (HOSPITAL_COMMUNITY): Payer: Medicare Other | Admitting: Psychiatry

## 2018-11-26 ENCOUNTER — Inpatient Hospital Stay (HOSPITAL_COMMUNITY): Payer: Medicare Other | Attending: Hematology

## 2018-11-26 ENCOUNTER — Other Ambulatory Visit: Payer: Self-pay

## 2018-11-26 DIAGNOSIS — R350 Frequency of micturition: Secondary | ICD-10-CM | POA: Insufficient documentation

## 2018-11-26 DIAGNOSIS — D7589 Other specified diseases of blood and blood-forming organs: Secondary | ICD-10-CM | POA: Diagnosis present

## 2018-11-26 DIAGNOSIS — Z8719 Personal history of other diseases of the digestive system: Secondary | ICD-10-CM | POA: Diagnosis not present

## 2018-11-26 DIAGNOSIS — E538 Deficiency of other specified B group vitamins: Secondary | ICD-10-CM | POA: Diagnosis not present

## 2018-11-26 DIAGNOSIS — R300 Dysuria: Secondary | ICD-10-CM | POA: Diagnosis not present

## 2018-11-26 DIAGNOSIS — K5080 Crohn's disease of both small and large intestine without complications: Secondary | ICD-10-CM | POA: Insufficient documentation

## 2018-11-26 LAB — CBC WITH DIFFERENTIAL/PLATELET
Basophils Absolute: 0 10*3/uL (ref 0.0–0.1)
Basophils Relative: 1 %
Eosinophils Absolute: 0.2 10*3/uL (ref 0.0–0.5)
Eosinophils Relative: 2 %
HCT: 37.9 % (ref 36.0–46.0)
Hemoglobin: 13 g/dL (ref 12.0–15.0)
Lymphocytes Relative: 23 %
Lymphs Abs: 1.5 10*3/uL (ref 0.7–4.0)
MCH: 35.2 pg — ABNORMAL HIGH (ref 26.0–34.0)
MCHC: 34.3 g/dL (ref 30.0–36.0)
MCV: 102.7 fL — ABNORMAL HIGH (ref 80.0–100.0)
Monocytes Absolute: 0.5 10*3/uL (ref 0.1–1.0)
Monocytes Relative: 8 %
Neutro Abs: 4.3 10*3/uL (ref 1.7–7.7)
Neutrophils Relative %: 66 %
Platelets: 251 10*3/uL (ref 150–400)
RBC: 3.69 MIL/uL — ABNORMAL LOW (ref 3.87–5.11)
RDW: 13.2 % (ref 11.5–15.5)
WBC: 6.5 10*3/uL (ref 4.0–10.5)
nRBC: 0 % (ref 0.0–0.2)

## 2018-11-26 LAB — FOLATE: Folate: 94.2 ng/mL (ref >5.9)

## 2018-11-26 LAB — COMPREHENSIVE METABOLIC PANEL
ALT: 24 U/L (ref 0–44)
AST: 30 U/L (ref 15–41)
Albumin: 3.8 g/dL (ref 3.5–5.0)
Alkaline Phosphatase: 110 U/L (ref 38–126)
Anion gap: 9 (ref 5–15)
BUN: 13 mg/dL (ref 8–23)
CO2: 26 mmol/L (ref 22–32)
Calcium: 9.7 mg/dL (ref 8.9–10.3)
Chloride: 103 mmol/L (ref 98–111)
Creatinine, Ser: 0.61 mg/dL (ref 0.44–1.00)
GFR calc Af Amer: >60 mL/min (ref >60)
GFR calc non Af Amer: >60 mL/min (ref >60)
Glucose, Bld: 106 mg/dL — ABNORMAL HIGH (ref 70–99)
Potassium: 3.9 mmol/L (ref 3.5–5.1)
Sodium: 138 mmol/L (ref 135–145)
Total Bilirubin: 2.5 mg/dL — ABNORMAL HIGH (ref 0.3–1.2)
Total Protein: 7.4 g/dL (ref 6.5–8.1)

## 2018-11-26 LAB — VITAMIN B12: Vitamin B-12: 1490 pg/mL — ABNORMAL HIGH (ref 180–914)

## 2018-11-26 LAB — SAVE SMEAR(SSMR), FOR PROVIDER SLIDE REVIEW

## 2018-11-26 LAB — LACTATE DEHYDROGENASE: LDH: 142 U/L (ref 98–192)

## 2018-11-26 LAB — FERRITIN: Ferritin: 72 ng/mL (ref 11–307)

## 2018-11-26 LAB — VITAMIN D 25 HYDROXY (VIT D DEFICIENCY, FRACTURES): Vit D, 25-Hydroxy: 78.4 ng/mL (ref 30–100)

## 2018-11-26 LAB — IRON AND TIBC
Iron: 102 ug/dL (ref 28–170)
Saturation Ratios: 23 % (ref 10.4–31.8)
TIBC: 447 ug/dL (ref 250–450)
UIBC: 345 ug/dL

## 2018-11-30 ENCOUNTER — Ambulatory Visit (INDEPENDENT_AMBULATORY_CARE_PROVIDER_SITE_OTHER): Payer: Medicaid Other | Admitting: Psychiatry

## 2018-11-30 ENCOUNTER — Other Ambulatory Visit: Payer: Self-pay

## 2018-11-30 DIAGNOSIS — F330 Major depressive disorder, recurrent, mild: Secondary | ICD-10-CM
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
## 2018-12-03 ENCOUNTER — Other Ambulatory Visit: Payer: Self-pay

## 2018-12-03 ENCOUNTER — Inpatient Hospital Stay (HOSPITAL_COMMUNITY): Payer: Medicare Other | Admitting: Hematology

## 2018-12-04 ENCOUNTER — Inpatient Hospital Stay (HOSPITAL_COMMUNITY): Payer: Medicare Other | Admitting: Hematology

## 2018-12-04 NOTE — Progress Notes (Unsigned)
-   Called number on patients chart. Once phone was answered, I asked to speak to Ms. Olheiser. Was told I had wrong number. No other number provided on patient's chart.

## 2018-12-04 NOTE — Progress Notes (Signed)
Created in error

## 2018-12-07 LAB — PATHOLOGIST SMEAR REVIEW

## 2018-12-15 ENCOUNTER — Telehealth (HOSPITAL_COMMUNITY): Payer: Self-pay | Admitting: *Deleted

## 2019-01-31 ENCOUNTER — Other Ambulatory Visit: Payer: Self-pay | Admitting: Nurse Practitioner

## 2019-02-02 ENCOUNTER — Encounter: Payer: Self-pay | Admitting: Neurology

## 2019-02-17 ENCOUNTER — Encounter: Payer: Self-pay | Admitting: Neurology

## 2019-02-17 ENCOUNTER — Telehealth (INDEPENDENT_AMBULATORY_CARE_PROVIDER_SITE_OTHER): Payer: Medicare Other | Admitting: Neurology

## 2019-02-17 ENCOUNTER — Other Ambulatory Visit: Payer: Self-pay

## 2019-02-17 VITALS — Ht 68.0 in | Wt 210.0 lb

## 2019-02-17 DIAGNOSIS — R200 Anesthesia of skin: Secondary | ICD-10-CM | POA: Diagnosis not present

## 2019-02-17 DIAGNOSIS — G3184 Mild cognitive impairment, so stated: Secondary | ICD-10-CM

## 2019-02-17 DIAGNOSIS — R202 Paresthesia of skin: Secondary | ICD-10-CM | POA: Diagnosis not present

## 2019-02-17 DIAGNOSIS — G629 Polyneuropathy, unspecified: Secondary | ICD-10-CM | POA: Diagnosis not present

## 2019-02-17 MED ORDER — GABAPENTIN 400 MG PO CAPS: ORAL_CAPSULE | ORAL | 3 refills | Status: DC

## 2019-02-17 MED ORDER — DONEPEZIL HCL 10 MG PO TABS: ORAL_TABLET | ORAL | 3 refills | Status: DC
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
## 2019-02-22 ENCOUNTER — Ambulatory Visit (HOSPITAL_COMMUNITY): Payer: Medicare Other | Admitting: Psychiatry

## 2019-02-22 ENCOUNTER — Other Ambulatory Visit: Payer: Self-pay

## 2019-02-22 ENCOUNTER — Telehealth (HOSPITAL_COMMUNITY): Payer: Self-pay | Admitting: Psychiatry

## 2019-02-22 NOTE — Telephone Encounter (Signed)
Therapist called patient for scheduled appointment and received voicemail message.  Therapist left message indicating attempt and requesting patient call office.

## 2019-02-26 ENCOUNTER — Other Ambulatory Visit: Payer: Self-pay

## 2019-02-26 ENCOUNTER — Ambulatory Visit (INDEPENDENT_AMBULATORY_CARE_PROVIDER_SITE_OTHER): Payer: Medicare Other | Admitting: Psychiatry

## 2019-02-26 DIAGNOSIS — F330 Major depressive disorder, recurrent, mild: Secondary | ICD-10-CM
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
## 2019-03-01 DIAGNOSIS — M542 Cervicalgia: Secondary | ICD-10-CM | POA: Diagnosis not present

## 2019-03-01 DIAGNOSIS — Z1159 Encounter for screening for other viral diseases: Secondary | ICD-10-CM | POA: Diagnosis not present

## 2019-03-01 DIAGNOSIS — G8929 Other chronic pain: Secondary | ICD-10-CM | POA: Diagnosis not present

## 2019-03-01 DIAGNOSIS — Z79899 Other long term (current) drug therapy: Secondary | ICD-10-CM | POA: Diagnosis not present

## 2019-03-01 DIAGNOSIS — M5442 Lumbago with sciatica, left side: Secondary | ICD-10-CM | POA: Diagnosis not present

## 2019-03-01 DIAGNOSIS — M5441 Lumbago with sciatica, right side: Secondary | ICD-10-CM | POA: Diagnosis not present

## 2019-03-04 ENCOUNTER — Encounter: Payer: Self-pay | Admitting: Psychiatry

## 2019-03-04 ENCOUNTER — Other Ambulatory Visit: Payer: Self-pay

## 2019-03-04 ENCOUNTER — Ambulatory Visit (INDEPENDENT_AMBULATORY_CARE_PROVIDER_SITE_OTHER): Payer: Medicare Other | Admitting: Psychiatry

## 2019-03-04 DIAGNOSIS — F330 Major depressive disorder, recurrent, mild: Secondary | ICD-10-CM | POA: Diagnosis not present

## 2019-03-04 DIAGNOSIS — R4189 Other symptoms and signs involving cognitive functions and awareness: Secondary | ICD-10-CM | POA: Insufficient documentation

## 2019-03-04 DIAGNOSIS — R419 Unspecified symptoms and signs involving cognitive functions and awareness: Secondary | ICD-10-CM

## 2019-03-04 DIAGNOSIS — F419 Anxiety disorder, unspecified: Secondary | ICD-10-CM

## 2019-03-04 DIAGNOSIS — R29818 Other symptoms and signs involving the nervous system: Secondary | ICD-10-CM | POA: Insufficient documentation

## 2019-03-04 MED ORDER — BUSPIRONE HCL 5 MG PO TABS: 5.0000 mg | ORAL_TABLET | Freq: Two times a day (BID) | ORAL | 0 refills | Status: DC

## 2019-03-04 MED ORDER — DULOXETINE HCL 30 MG PO CPEP: 30.0000 mg | ORAL_CAPSULE | Freq: Two times a day (BID) | ORAL | 0 refills | Status: DC

## 2019-03-04 NOTE — Progress Notes (Signed)
[facility] OP Progress Note  Virtual Visit via Telephone Note  I connected with Sheri [name] on 03/04/19 at  9:30 AM EST by telephone and verified that I am speaking with the correct person using two identifiers.    I discussed the limitations, risks, security and privacy concerns of performing an evaluation and management service by telephone and the availability of in person appointments. I also discussed with the patient that there may be a patient responsible charge related to this service. The patient expressed understanding and agreed to proceed.   03/04/2019 9:44 AM Sheri [name]  MRN:  [id number]  Chief Complaint: " I have been feeling very anxious and shaky lately."  HPI: Sheri [name] is a 69 y.o. year old female with a history of depression, mild neurocognitive disorder,  Crohn's disease, hypertension,osteopenia, macrocytosis, who was contacted by phone for follow-up appointment.   Patient reported that she was doing fine up until a few weeks ago when she noticed that she is very anxious and feels shaky from the inside.  She is not sure of any specific triggers that is causing this.  She stated that she enjoys the company of her grandchildren and they come to visit her frequently.  She has been procrastinating about some of the things that she needs to take care of like setting of her closets.  She stated that she believes she has to any responsibilities and she is trying to cut down a few if possible.  She has been taking temazepam for sleep regularly with good effect. She informed that in the past she is taking Xanax and Valium with good effect for anxiety. Patient was informed that due to the fact that she is already on temazepam 30 mg at bedtime and also has displayed mild neurocognitive deficits additional for benzodiazepines will be avoided.  She was offered buspirone for anxiety on a scheduled basis.  She was also advised to divide the dose of Cymbalta 30 mg twice daily for optimal  effect.  Visit Diagnosis:    ICD-10-CM   1. MDD (major depressive disorder), recurrent episode, mild ([place])  F33.0   2. Neurocognitive disorder  R41.9   3. Anxiety  F41.9     Past Psychiatric History: MDD, neurocognitive disorder, anxiety  Past Medical History:  Past Medical History:  Diagnosis Date  . Anemia   . Chronic back pain   . Chronic neck pain   . Complication of anesthesia   . Crohn disease ([place])   . Fibromyalgia   . GERD (gastroesophageal reflux disease)   . Hypertension   . Low back pain   . Macrocytosis without anemia 11/28/2017  . Osteopenia    Last DEXA 12/2010 was normal, on fosamax  . PONV (postoperative nausea and vomiting)   . Skin cancer    sees Dr. Tarri Glenn in [place]    Past Surgical History:  Procedure Laterality Date  . APPENDECTOMY  1990  . BREAST EXCISIONAL BIOPSY Right   . CATARACT EXTRACTION W/PHACO Left 01/13/2014   Procedure: CATARACT EXTRACTION PHACO AND INTRAOCULAR LENS PLACEMENT LEFT EYE;  Surgeon: Tonny Branch, MD;  Location: [facility];  Service: Ophthalmology;  Laterality: Left;  CDE:6.90  . CATARACT EXTRACTION W/PHACO Right 01/24/2014   Procedure: CATARACT EXTRACTION PHACO AND INTRAOCULAR LENS PLACEMENT (IOC);  Surgeon: Tonny Branch, MD;  Location: [facility];  Service: Ophthalmology;  Laterality: Right;  CDE:5.91  . CESAREAN SECTION  [id number]  . COLONOSCOPY  09/11/2006  . COLONOSCOPY  10/30/2010  RMR: External and internal hemorrhoids, likely source of hematochezia  otherwise normal rectum/ Left-sided diverticula, status post prior right hemicolectomy with a friable, inflamed, stenotic surgical anastomosis with upstream dilation of the neoterminal ileum/ Crohn's noted, status post biopsy  . COLONOSCOPY N/A 06/23/2014   Dr. Rourk:friable eroded mucosa at the anastomosis with small bowel, consistent with some stenosis Crohn's disease s/p biopsy. Colonic diverticulosis and external hemorrhoids. Pathology with benign anastomotic mucosa, no dysplasia.   Marland Kitchen  drainage of back abscess  2007  . ESOPHAGOGASTRODUODENOSCOPY  04/2009   noncritical schatzki's ring, small hh, sb bx negative  . ESOPHAGOGASTRODUODENOSCOPY (EGD) WITH PROPOFOL N/A 02/06/2017   Dr. Gala Romney: mild schatzki's ring at GE junction s/p dilation, small hiatal hernia, multiple 3 mm pedunculated and sessile polyps in gastric fundus, normal duodenum  . MALONEY DILATION N/A 02/06/2017   Procedure: Venia Minks DILATION;  Surgeon: Daneil Dolin, MD;  Location: [facility];  Service: Endoscopy;  Laterality: N/A;  . POLYPECTOMY  02/06/2017   Procedure: POLYPECTOMY;  Surgeon: Daneil Dolin, MD;  Location: [facility];  Service: Endoscopy;;  gastric  . SMALL INTESTINE SURGERY  [phone]    Family Psychiatric History: denied  Family History:  Family History  Problem Relation Age of Onset  . Crohn's disease Mother        succumbed to stomach cancer in her 17s  . Stomach cancer Mother   . Heart attack Father   . COPD Father   . Colon cancer Neg Hx     Social History:  Social History   Socioeconomic History  . Marital status: Legally Separated    Spouse name: Not on file  . Number of children: 2  . Years of education: 74  . Highest education level: 12th grade  Occupational History  . Occupation: disability    Employer: NOT EMPLOYED  Tobacco Use  . Smoking status: Never Smoker  . Smokeless tobacco: Never Used  Substance and Sexual Activity  . Alcohol use: No  . Drug use: No  . Sexual activity: Yes    Birth control/protection: Post-menopausal  Other Topics Concern  . Not on file  Social History Narrative   Highest level of edu- 12th      Right handed      Lives in one story home. Son lives with her currently.   Social Determinants of Health   Financial Resource Strain:   . Difficulty of Paying Living Expenses: Not on file  Food Insecurity:   . Worried About Charity fundraiser in the Last Year: Not on file  . Ran Out of Food in the Last Year: Not on file   Transportation Needs:   . Lack of Transportation (Medical): Not on file  . Lack of Transportation (Non-Medical): Not on file  Physical Activity:   . Days of Exercise per Week: Not on file  . Minutes of Exercise per Session: Not on file  Stress:   . Feeling of Stress : Not on file  Social Connections:   . Frequency of Communication with Friends and Family: Not on file  . Frequency of Social Gatherings with Friends and Family: Not on file  . Attends Religious Services: Not on file  . Active Member of Clubs or Organizations: Not on file  . Attends Archivist Meetings: Not on file  . Marital Status: Not on file    Allergies:  Allergies  Allergen Reactions  . Codeine Sulfate Nausea Only    Metabolic Disorder Labs: No results  found for: HGBA1C, MPG No results found for: PROLACTIN No results found for: CHOL, TRIG, HDL, CHOLHDL, VLDL, LDLCALC Lab Results  Component Value Date   TSH 1.934 05/07/2017   TSH 1.78 04/03/2011    Therapeutic Level Labs: No results found for: LITHIUM No results found for: VALPROATE No components found for:  CBMZ  Current Medications: Current Outpatient Medications  Medication Sig Dispense Refill  . acetaminophen (TYLENOL) 500 MG tablet Take 1,000 mg by mouth every 6 (six) hours as needed for moderate pain or headache.    . azaTHIOprine (IMURAN) 50 MG tablet TAKE 2 & 1/2 TABLET ONCE DAILY. 75 tablet 3  . Biotin 10 MG CAPS Take 20 mg by mouth daily.    . Calcium Carbonate-Vitamin D (CALCIUM 600+D PO) Take 1 capsule by mouth 3 (three) times daily.     . Cholecalciferol (EQL VITAMIN D3) 2000 units CAPS Take 2,000 Units by mouth once a week.     . cyanocobalamin (,VITAMIN B-12,) 1000 MCG/ML injection Inject 1,000 mcg into the muscle every 14 (fourteen) days.    . cyclobenzaprine (FLEXERIL) 10 MG tablet Take 10 mg by mouth 3 (three) times daily.     Marland Kitchen DEXILANT 60 MG capsule TAKE 1 CAPSULE BY MOUTH ONCE DAILY. 30 capsule 5  . donepezil (ARICEPT)  10 MG tablet TAKE 1 TABLET DAILY. 90 tablet 3  . ergocalciferol (VITAMIN D2) 50000 units capsule Take 50,000 Units by mouth every Sunday.    . gabapentin (NEURONTIN) 400 MG capsule Take 1 tab in AM, 3 tabs at bedtime 360 capsule 3  . hydrocortisone 2.5 % cream as needed.     Marland Kitchen ketoconazole (NIZORAL) 2 % cream Apply 1 application topically daily as needed for irritation.    Javier Docker Oil 350 MG CAPS Take 350 mg by mouth daily.    Marland Kitchen lisinopril-hydrochlorothiazide (PRINZIDE,ZESTORETIC) 20-12.5 MG per tablet Take 1 tablet by mouth daily. 30 tablet 0  . mesalamine (PENTASA) 500 MG CR capsule TAKE (2) CAPSULES FOUR TIMES DAILY. 240 capsule 11  . Omega-3 Fatty Acids (FISH OIL PO) Take 1 capsule by mouth at bedtime.    Marland Kitchen oxyCODONE (ROXICODONE) 15 MG immediate release tablet Take 15 mg by mouth every 6 (six) hours as needed for pain.     . OXYCONTIN 10 MG 12 hr tablet Take 10 mg by mouth every 12 (twelve) hours.     . temazepam (RESTORIL) 15 MG capsule Take 2 capsules (30 mg total) by mouth at bedtime. 60 capsule 3  . VITAMIN E PO Take 1 Units by mouth 2 (two) times daily.     Marland Kitchen zinc gluconate 50 MG tablet Take 50 mg by mouth daily.       No current facility-administered medications for this visit.      Psychiatric Specialty Exam: Review of Systems  There were no vitals taken for this visit.There is no height or weight on file to calculate BMI.  General Appearance: Unable to assess due to phone visit  Eye Contact: Unable to assess due to phone visit  Speech:  Clear and Coherent and Normal Rate  Volume:  Normal  Mood:  Anxious  Affect:  Congruent  Thought Process:  Goal Directed, Linear and Descriptions of Associations: Intact  Orientation:  Full (Time, Place, and Person)  Thought Content: Logical   Suicidal Thoughts:  No  Homicidal Thoughts:  No  [name]:  Recent;   Fair Remote;   Good  Judgement:  Fair  Insight:  Fair  Psychomotor  Activity:  Normal  Concentration:  Concentration: Good and  Attention Span: Good  Recall:  Good  Fund of Knowledge: Good  Language: Good  Akathisia:  Negative  Handed:  Right  AIMS (if indicated): not done  Assets:  Communication Skills Desire for Improvement Financial Resources/Insurance Housing  ADL's:  Intact  Cognition: WNL  Sleep:  Good with help of temazepam   Screenings: Mini-Mental     Office Visit from 11/12/2017 in [facility]  Total Score (max 30 points )  28       Assessment and Plan: Patient was seen earlier than her scheduled appointment due to her complaints of anxiety that is increasing progressively.  She has been seen therapist Ms. Peggy regularly.  She was recommended to divide the dose of Cymbalta to 30 mg twice daily instead of taking 60 mg together.  And buspirone 5 mg twice daily was added to help with anxiety. Continue Temazepam 30 mg HS for sleep.  1. MDD (major depressive disorder), recurrent episode, mild ([facility])  - DULoxetine (CYMBALTA) 30 MG capsule; Take 1 capsule (30 mg total) by mouth 2 (two) times daily.  Dispense: 180 capsule; Refill: 0 - busPIRone (BUSPAR) 5 MG tablet; Take 1 tablet (5 mg total) by mouth 2 (two) times daily.  Dispense: 180 tablet; Refill: 0  2. Neurocognitive disorder - Continue donepezil 10 mg daily - prescribed by neurologist  3. Anxiety  - DULoxetine (CYMBALTA) 30 MG capsule; Take 1 capsule (30 mg total) by mouth 2 (two) times daily.  Dispense: 180 capsule; Refill: 0 - busPIRone (BUSPAR) 5 MG tablet; Take 1 tablet (5 mg total) by mouth 2 (two) times daily.  Dispense: 180 tablet; Refill: 0  F/up with Dr. Modesta Messing in 6 weeks. Continue individual therapy with ms. Peggy.     [place] Crane, MD 03/04/2019, 9:44 AM

## 2019-03-09 ENCOUNTER — Other Ambulatory Visit: Payer: Self-pay

## 2019-03-09 ENCOUNTER — Ambulatory Visit (INDEPENDENT_AMBULATORY_CARE_PROVIDER_SITE_OTHER): Payer: Medicare Other | Admitting: Neurology

## 2019-03-09 DIAGNOSIS — G5601 Carpal tunnel syndrome, right upper limb: Secondary | ICD-10-CM

## 2019-03-09 DIAGNOSIS — G629 Polyneuropathy, unspecified: Secondary | ICD-10-CM

## 2019-03-09 NOTE — Procedures (Signed)
[facility]  [place], [address]  [place], [place] [address] Tel: [phone] Fax:  [phone] Test Date:  03/09/2019  Patient: Sheri [name] DOB: [birth year] Physician: Narda Amber, DO  Sex: Female Height: 5' 8"  Ref Phys: Ellouise Newer, MD  ID#: [id number] Temp: 32.0C Technician:    Patient Complaints: This is a 69 year old female referred for evaluation of bilateral feet and right hand paresthesias.  NCV & EMG Findings: Extensive electrodiagnostic testing of the right upper and lower extremity shows: 1. Right mixed palmar sensory responses show prolonged latency.  Right median and ulnar sensory responses are within normal limits. 2. Right sural and superficial peroneal sensory responses are absent. 3. Right median and ulnar motor responses are within normal limits.  Of note, there is evidence of a median-to-ulnar crossover in the forearm as seen by a motor response stimulating at the ulnar-wrist and recording at the abductor pollicis brevis muscle. 4. Right peroneal motor responses within normal limits.  Right tibial motor response shows reduced amplitude (3.4 mV).   5. Right tibial H reflex study is prolonged (38.91 ms).   6. In the right upper extremity, there is no evidence of active or chronic motor axonal changes affecting any of the tested muscles. 7. In the right lower extremity, there is chronic motor axonal loss changes affecting the medial gastrocnemius and flexor digitorum longus muscles, without accompanied active denervation.  Impression: 1. 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 2. Right median neuropathy at or distal to the wrist (mild), consistent with a clinical diagnosis of carpal tunnel syndrome.   3. Incidentally, there is a right Martin-Gruber anastomoses, a normal anatomic variant.   ___________________________ Narda Amber, DO    Nerve Conduction Studies Anti Sensory Summary Table   Site NR Peak  (ms) Norm Peak (ms) P-T Amp (V) Norm P-T Amp  Right Median Anti Sensory (2nd Digit)  32C  Wrist    3.6 <3.8 13.7 >10  Right Sup Peroneal Anti Sensory (Ant Lat Mall)  32C  12 cm NR  <4.6  >3  Right Sural Anti Sensory (Lat Mall)  32C  Calf NR  <4.6  >3  Right Ulnar Anti Sensory (5th Digit)  32C  Wrist    2.3 <3.2 22.7 >5   Motor Summary Table   Site NR Onset (ms) Norm Onset (ms) O-P Amp (mV) Norm O-P Amp Site1 Site2 Delta-0 (ms) Dist (cm) Vel (m/s) Norm Vel (m/s)  Right Median Motor (Abd Poll Brev)  32C  Wrist    3.8 <4.0 6.3 >5 Elbow Wrist 5.5 28.0 51 >50  Elbow    9.3  4.0  Ulnar-wrist crossover Elbow 5.3 0.0    Ulnar-wrist crossover    4.0  5.6         Right Peroneal Motor (Ext Dig Brev)  32C  Ankle    4.2 <6.0 3.0 >2.5 B Fib Ankle 8.4 38.0 45 >40  B Fib    12.6  2.5  Poplt B Fib 1.6 8.0 50 >40  Poplt    14.2  2.5         Right Tibial Motor (Abd Hall Brev)  32C  Ankle    3.8 <6.0 3.4 >4 Knee Ankle 10.0 44.0 44 >40  Knee    13.8  2.7         Right Ulnar Motor (Abd Dig Minimi)  32C  Wrist    2.0 <3.1 10.3 >7 B Elbow Wrist 4.3 22.0 51 >50  B Elbow  6.3  9.0  A Elbow B Elbow 1.9 10.0 53 >50  A Elbow    8.2  8.9          Comparison Summary Table   Site NR Peak (ms) Norm Peak (ms) P-T Amp (V) Site1 Site2 Delta-P (ms) Norm Delta (ms)  Right Median/Ulnar Palm Comparison (Wrist - 8cm)  32C  Median Palm    2.2 <2.2 37.3 Median Palm Ulnar Palm 0.7   Ulnar Palm    1.5 <2.2 24.7       H Reflex Studies   NR H-Lat (ms) Lat Norm (ms) L-R H-Lat (ms)  Right Tibial (Gastroc)  32C     38.91 <35    EMG   Side Muscle Ins Act Fibs Psw Fasc Number Recrt Dur Dur. Amp Amp. Poly Poly. Comment  Right AntTibialis Nml Nml Nml Nml Nml Nml Nml Nml Nml Nml Nml Nml N/A  Right Gastroc Nml Nml Nml Nml 1- Rapid Some 1+ Some 1+ Nml Nml N/A  Right Flex Dig Long Nml Nml Nml Nml 1- Rapid Some 1+ Some 1+ Nml Nml N/A  Right RectFemoris Nml Nml Nml Nml Nml Nml Nml Nml Nml Nml Nml Nml N/A  Right  GluteusMed Nml Nml Nml Nml Nml Nml Nml Nml Nml Nml Nml Nml N/A  Right BicepsFemS Nml Nml Nml Nml Nml Nml Nml Nml Nml Nml Nml Nml N/A  Right 1stDorInt Nml Nml Nml Nml Nml Nml Nml Nml Nml Nml Nml Nml N/A  Right Abd Poll Brev Nml Nml Nml Nml Nml Nml Nml Nml Nml Nml Nml Nml N/A  Right PronatorTeres Nml Nml Nml Nml Nml Nml Nml Nml Nml Nml Nml Nml N/A  Right Biceps Nml Nml Nml Nml Nml Nml Nml Nml Nml Nml Nml Nml N/A  Right Triceps Nml Nml Nml Nml Nml Nml Nml Nml Nml Nml Nml Nml N/A  Right Deltoid Nml Nml Nml Nml Nml Nml Nml Nml Nml Nml Nml Nml N/A      Waveforms:

## 2019-03-11 ENCOUNTER — Telehealth: Payer: Self-pay

## 2019-03-11 NOTE — Telephone Encounter (Signed)
No need for Rx for wrist brace, she can get that at any pharmacy over the counter. Would stay on same dose of gabapentin for now, higher dose can also start affecting her memory. Thanks

## 2019-03-11 NOTE — Telephone Encounter (Signed)
Pt called no answer voice mail for her to call back

## 2019-03-11 NOTE — Telephone Encounter (Signed)
Spoke with pt she is asking if she needs a RX for the hand splint? She said she would talk to her daughter about the balance therapy. And she stated that she can tell a little difference but still has some pain with the gabapentin

## 2019-03-11 NOTE — Telephone Encounter (Signed)
Spoke with pt she is going to the pharmacy to gt splint for her wrist, and verbalized understanding for not increasing dose of gabapentin due to risk of affecting her memory

## 2019-03-15 ENCOUNTER — Ambulatory Visit (INDEPENDENT_AMBULATORY_CARE_PROVIDER_SITE_OTHER): Payer: Medicare Other | Admitting: Psychiatry

## 2019-03-15 ENCOUNTER — Other Ambulatory Visit: Payer: Self-pay

## 2019-03-15 DIAGNOSIS — F330 Major depressive disorder, recurrent, mild: Secondary | ICD-10-CM | POA: Diagnosis not present

## 2019-03-15 DIAGNOSIS — F419 Anxiety disorder, unspecified: Secondary | ICD-10-CM | POA: Diagnosis not present
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
## 2019-03-19 ENCOUNTER — Ambulatory Visit: Payer: Medicare Other | Admitting: Psychiatry

## 2019-03-29 ENCOUNTER — Ambulatory Visit (HOSPITAL_COMMUNITY): Payer: Medicare Other | Admitting: Psychiatry

## 2019-03-29 ENCOUNTER — Telehealth (HOSPITAL_COMMUNITY): Payer: Self-pay | Admitting: Psychiatry

## 2019-03-29 ENCOUNTER — Other Ambulatory Visit: Payer: Self-pay

## 2019-03-29 NOTE — Telephone Encounter (Signed)
Therapist attempted to contact patient via phone for scheduled appointment, received voicemail message.  Therapist left message indicating attempt and requesting patient call office.

## 2019-03-31 DIAGNOSIS — M542 Cervicalgia: Secondary | ICD-10-CM | POA: Diagnosis not present

## 2019-03-31 DIAGNOSIS — Z79899 Other long term (current) drug therapy: Secondary | ICD-10-CM | POA: Diagnosis not present

## 2019-03-31 DIAGNOSIS — M5441 Lumbago with sciatica, right side: Secondary | ICD-10-CM | POA: Diagnosis not present

## 2019-03-31 DIAGNOSIS — M5442 Lumbago with sciatica, left side: Secondary | ICD-10-CM | POA: Diagnosis not present

## 2019-03-31 DIAGNOSIS — Z03818 Encounter for observation for suspected exposure to other biological agents ruled out: Secondary | ICD-10-CM | POA: Diagnosis not present

## 2019-03-31 DIAGNOSIS — G8929 Other chronic pain: Secondary | ICD-10-CM | POA: Diagnosis not present
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
## 2019-04-01 ENCOUNTER — Other Ambulatory Visit (HOSPITAL_COMMUNITY): Payer: Medicare Other

## 2019-04-02 ENCOUNTER — Other Ambulatory Visit (HOSPITAL_COMMUNITY): Payer: Self-pay | Admitting: *Deleted

## 2019-04-02 DIAGNOSIS — D696 Thrombocytopenia, unspecified: Secondary | ICD-10-CM

## 2019-04-02 DIAGNOSIS — D7589 Other specified diseases of blood and blood-forming organs: Secondary | ICD-10-CM

## 2019-04-02 DIAGNOSIS — E538 Deficiency of other specified B group vitamins: Secondary | ICD-10-CM

## 2019-04-02 DIAGNOSIS — D500 Iron deficiency anemia secondary to blood loss (chronic): Secondary | ICD-10-CM

## 2019-04-02 DIAGNOSIS — D649 Anemia, unspecified: Secondary | ICD-10-CM

## 2019-04-05 ENCOUNTER — Other Ambulatory Visit: Payer: Self-pay

## 2019-04-05 ENCOUNTER — Inpatient Hospital Stay (HOSPITAL_COMMUNITY): Payer: Medicare Other | Attending: Hematology

## 2019-04-05 DIAGNOSIS — Z79899 Other long term (current) drug therapy: Secondary | ICD-10-CM | POA: Diagnosis not present

## 2019-04-05 DIAGNOSIS — D500 Iron deficiency anemia secondary to blood loss (chronic): Secondary | ICD-10-CM

## 2019-04-05 DIAGNOSIS — K219 Gastro-esophageal reflux disease without esophagitis: Secondary | ICD-10-CM | POA: Insufficient documentation

## 2019-04-05 DIAGNOSIS — D7589 Other specified diseases of blood and blood-forming organs: Secondary | ICD-10-CM

## 2019-04-05 DIAGNOSIS — D649 Anemia, unspecified: Secondary | ICD-10-CM

## 2019-04-05 DIAGNOSIS — K5090 Crohn's disease, unspecified, without complications: Secondary | ICD-10-CM | POA: Diagnosis not present

## 2019-04-05 DIAGNOSIS — M797 Fibromyalgia: Secondary | ICD-10-CM | POA: Insufficient documentation

## 2019-04-05 DIAGNOSIS — I10 Essential (primary) hypertension: Secondary | ICD-10-CM | POA: Insufficient documentation

## 2019-04-05 DIAGNOSIS — E538 Deficiency of other specified B group vitamins: Secondary | ICD-10-CM | POA: Diagnosis not present

## 2019-04-05 DIAGNOSIS — D696 Thrombocytopenia, unspecified: Secondary | ICD-10-CM

## 2019-04-05 LAB — CBC WITH DIFFERENTIAL/PLATELET
Abs Immature Granulocytes: 0.02 10*3/uL (ref 0.00–0.07)
Basophils Absolute: 0 10*3/uL (ref 0.0–0.1)
Basophils Relative: 1 %
Eosinophils Absolute: 0.1 10*3/uL (ref 0.0–0.5)
Eosinophils Relative: 1 %
HCT: 37.8 % (ref 36.0–46.0)
Hemoglobin: 13 g/dL (ref 12.0–15.0)
Immature Granulocytes: 0 %
Lymphocytes Relative: 27 %
Lymphs Abs: 1.7 10*3/uL (ref 0.7–4.0)
MCH: 35.4 pg — ABNORMAL HIGH (ref 26.0–34.0)
MCHC: 34.4 g/dL (ref 30.0–36.0)
MCV: 103 fL — ABNORMAL HIGH (ref 80.0–100.0)
Monocytes Absolute: 0.6 10*3/uL (ref 0.1–1.0)
Monocytes Relative: 9 %
Neutro Abs: 4 10*3/uL (ref 1.7–7.7)
Neutrophils Relative %: 62 %
Platelets: 232 10*3/uL (ref 150–400)
RBC: 3.67 MIL/uL — ABNORMAL LOW (ref 3.87–5.11)
RDW: 13.5 % (ref 11.5–15.5)
WBC: 6.4 10*3/uL (ref 4.0–10.5)
nRBC: 0 % (ref 0.0–0.2)

## 2019-04-05 LAB — VITAMIN B12: Vitamin B-12: 5118 pg/mL — ABNORMAL HIGH (ref 180–914)

## 2019-04-05 LAB — IRON AND TIBC
Iron: 122 ug/dL (ref 28–170)
Saturation Ratios: 27 % (ref 10.4–31.8)
TIBC: 453 ug/dL — ABNORMAL HIGH (ref 250–450)
UIBC: 331 ug/dL

## 2019-04-05 LAB — COMPREHENSIVE METABOLIC PANEL
ALT: 26 U/L (ref 0–44)
AST: 29 U/L (ref 15–41)
Albumin: 3.8 g/dL (ref 3.5–5.0)
Alkaline Phosphatase: 110 U/L (ref 38–126)
Anion gap: 9 (ref 5–15)
BUN: 15 mg/dL (ref 8–23)
CO2: 24 mmol/L (ref 22–32)
Calcium: 9.7 mg/dL (ref 8.9–10.3)
Chloride: 106 mmol/L (ref 98–111)
Creatinine, Ser: 0.59 mg/dL (ref 0.44–1.00)
GFR calc Af Amer: >60 mL/min (ref >60)
GFR calc non Af Amer: >60 mL/min (ref >60)
Glucose, Bld: 108 mg/dL — ABNORMAL HIGH (ref 70–99)
Potassium: 3.9 mmol/L (ref 3.5–5.1)
Sodium: 139 mmol/L (ref 135–145)
Total Bilirubin: 2.4 mg/dL — ABNORMAL HIGH (ref 0.3–1.2)
Total Protein: 7.2 g/dL (ref 6.5–8.1)

## 2019-04-05 LAB — VITAMIN D 25 HYDROXY (VIT D DEFICIENCY, FRACTURES): Vit D, 25-Hydroxy: 74.57 ng/mL (ref 30–100)

## 2019-04-05 LAB — FERRITIN: Ferritin: 53 ng/mL (ref 11–307)

## 2019-04-05 LAB — LACTATE DEHYDROGENASE: LDH: 148 U/L (ref 98–192)

## 2019-04-05 LAB — FOLATE: Folate: 51.3 ng/mL (ref >5.9)

## 2019-04-06 ENCOUNTER — Ambulatory Visit (INDEPENDENT_AMBULATORY_CARE_PROVIDER_SITE_OTHER): Payer: Medicare Other | Admitting: Psychiatry

## 2019-04-06 ENCOUNTER — Encounter (HOSPITAL_COMMUNITY): Payer: Self-pay | Admitting: Psychiatry

## 2019-04-06 ENCOUNTER — Ambulatory Visit: Payer: Medicare Other | Admitting: Psychiatry

## 2019-04-06 ENCOUNTER — Other Ambulatory Visit: Payer: Self-pay

## 2019-04-06 DIAGNOSIS — F419 Anxiety disorder, unspecified: Secondary | ICD-10-CM | POA: Diagnosis not present

## 2019-04-06 DIAGNOSIS — C44722 Squamous cell carcinoma of skin of right lower limb, including hip: Secondary | ICD-10-CM | POA: Diagnosis not present

## 2019-04-06 DIAGNOSIS — F330 Major depressive disorder, recurrent, mild: Secondary | ICD-10-CM

## 2019-04-06 DIAGNOSIS — L570 Actinic keratosis: Secondary | ICD-10-CM | POA: Diagnosis not present

## 2019-04-06 MED ORDER — DULOXETINE HCL 60 MG PO CPEP: 60.0000 mg | ORAL_CAPSULE | Freq: Every day | ORAL | 2 refills | Status: DC

## 2019-04-06 MED ORDER — BUSPIRONE HCL 10 MG PO TABS: 10.0000 mg | ORAL_TABLET | Freq: Two times a day (BID) | ORAL | 2 refills | Status: DC

## 2019-04-06 MED ORDER — TEMAZEPAM 15 MG PO CAPS: 30.0000 mg | ORAL_CAPSULE | Freq: Every day | ORAL | 2 refills | Status: DC

## 2019-04-06 NOTE — Patient Instructions (Signed)
1. Continue Duloxetine 60 mg daily 2. Increase buspar 10 mg twice a day  3. Continue temazepam 30 mg at night 4.Next appointment: 5/10 at 3:40

## 2019-04-07 DIAGNOSIS — Z7189 Other specified counseling: Secondary | ICD-10-CM | POA: Diagnosis not present

## 2019-04-07 DIAGNOSIS — Z299 Encounter for prophylactic measures, unspecified: Secondary | ICD-10-CM | POA: Diagnosis not present

## 2019-04-07 DIAGNOSIS — Z0000 Encounter for general adult medical examination without abnormal findings: Secondary | ICD-10-CM | POA: Diagnosis not present

## 2019-04-07 DIAGNOSIS — Z1211 Encounter for screening for malignant neoplasm of colon: Secondary | ICD-10-CM | POA: Diagnosis not present

## 2019-04-07 DIAGNOSIS — Z79899 Other long term (current) drug therapy: Secondary | ICD-10-CM | POA: Diagnosis not present

## 2019-04-07 DIAGNOSIS — R5383 Other fatigue: Secondary | ICD-10-CM | POA: Diagnosis not present

## 2019-04-07 DIAGNOSIS — I10 Essential (primary) hypertension: Secondary | ICD-10-CM | POA: Diagnosis not present

## 2019-04-07 DIAGNOSIS — E7800 Pure hypercholesterolemia, unspecified: Secondary | ICD-10-CM | POA: Diagnosis not present

## 2019-04-08 ENCOUNTER — Inpatient Hospital Stay (HOSPITAL_BASED_OUTPATIENT_CLINIC_OR_DEPARTMENT_OTHER): Payer: Medicare Other | Admitting: Nurse Practitioner

## 2019-04-08 DIAGNOSIS — D7589 Other specified diseases of blood and blood-forming organs: Secondary | ICD-10-CM
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
## 2019-04-13 ENCOUNTER — Ambulatory Visit (INDEPENDENT_AMBULATORY_CARE_PROVIDER_SITE_OTHER): Payer: Medicare Other | Admitting: Psychiatry

## 2019-04-13 ENCOUNTER — Other Ambulatory Visit: Payer: Self-pay

## 2019-04-13 DIAGNOSIS — F419 Anxiety disorder, unspecified: Secondary | ICD-10-CM | POA: Diagnosis not present

## 2019-04-13 DIAGNOSIS — F330 Major depressive disorder, recurrent, mild: Secondary | ICD-10-CM | POA: Diagnosis not present
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
## 2019-04-22 DIAGNOSIS — C44722 Squamous cell carcinoma of skin of right lower limb, including hip: Secondary | ICD-10-CM | POA: Diagnosis not present

## 2019-04-22 DIAGNOSIS — L570 Actinic keratosis: Secondary | ICD-10-CM | POA: Diagnosis not present

## 2019-04-26 ENCOUNTER — Ambulatory Visit (INDEPENDENT_AMBULATORY_CARE_PROVIDER_SITE_OTHER): Payer: Medicare Other | Admitting: Psychiatry

## 2019-04-26 ENCOUNTER — Other Ambulatory Visit: Payer: Self-pay

## 2019-04-26 ENCOUNTER — Encounter (HOSPITAL_COMMUNITY): Payer: Self-pay | Admitting: Psychiatry

## 2019-04-26 DIAGNOSIS — F330 Major depressive disorder, recurrent, mild: Secondary | ICD-10-CM
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
## 2019-04-26 NOTE — Progress Notes (Signed)
Primary Care Physician:  Monico Blitz, MD  Primary GI: Dr. Gala Romney   Patient Location: Home   Provider Location: [facility] office   Reason for Visit: Follow-up    Persons present on the virtual encounter, with roles:    Total time (minutes) spent on medical discussion: 25 minutes   Due to COVID-19, visit was conducted using virtual method.  Visit was requested by patient.  Virtual Visit via Telephone Note Due to COVID-19, visit is conducted virtually and was requested by patient.   I connected with Sheri [name] on 04/27/19 at  2:00 PM EDT by telephone and verified that I am speaking with the correct person using two identifiers.   I discussed the limitations, risks, security and privacy concerns of performing an evaluation and management service by telephone and the availability of in person appointments. I also discussed with the patient that there may be a patient responsible charge related to this service. The patient expressed understanding and agreed to proceed.  Chief Complaint  Patient presents with  . Follow-up    Crohn's     History of Present Illness: Pleasant 69 year old female with history of ileocolonic Crohn's disease dating back many years on Imuran and Pentasa.Due to rectal bleeding in May 2016, underwent updated colonoscopy. Notedfriable eroded mucosa at the anastomosis with small bowel, consistent with some stenosis Crohn's disease s/p biopsy. Colonic diverticulosis and external hemorrhoids. Pathology with benign anastomotic mucosa, no dysplasia. She is here for routine follow-up.   No rectal bleeding. DEXA scan completed through PCP. Will obtain results. Imuran 125 mg tablets daily. Pentasa 1000 mg QID. No abdominal pain. No significant change in bowel habits. GERD controlled with Dexilant daily. No dysphagia. Good appetite. Doing well overall.    Past Medical History:  Diagnosis Date  . Anemia   . Chronic back pain   . Chronic neck pain   .  Complication of anesthesia   . Crohn disease ([place])   . Fibromyalgia   . GERD (gastroesophageal reflux disease)   . Hypertension   . Low back pain   . Macrocytosis without anemia 11/28/2017  . Osteopenia    Last DEXA 12/2010 was normal, on fosamax  . PONV (postoperative nausea and vomiting)   . Skin cancer    sees Dr. Tarri Glenn in [place]     Past Surgical History:  Procedure Laterality Date  . APPENDECTOMY  1990  . BREAST EXCISIONAL BIOPSY Right   . CATARACT EXTRACTION W/PHACO Left 01/13/2014   Procedure: CATARACT EXTRACTION PHACO AND INTRAOCULAR LENS PLACEMENT LEFT EYE;  Surgeon: Tonny Branch, MD;  Location: [facility];  Service: Ophthalmology;  Laterality: Left;  CDE:6.90  . CATARACT EXTRACTION W/PHACO Right 01/24/2014   Procedure: CATARACT EXTRACTION PHACO AND INTRAOCULAR LENS PLACEMENT (IOC);  Surgeon: Tonny Branch, MD;  Location: [facility];  Service: Ophthalmology;  Laterality: Right;  CDE:5.91  . CESAREAN SECTION  [id number]  . COLONOSCOPY  09/11/2006  . COLONOSCOPY  10/30/2010   RMR: External and internal hemorrhoids, likely source of hematochezia  otherwise normal rectum/ Left-sided diverticula, status post prior right hemicolectomy with a friable, inflamed, stenotic surgical anastomosis with upstream dilation of the neoterminal ileum/ Crohn's noted, status post biopsy  . COLONOSCOPY N/A 06/23/2014   Dr. Rourk:friable eroded mucosa at the anastomosis with small bowel, consistent with some stenosis Crohn's disease s/p biopsy. Colonic diverticulosis and external hemorrhoids. Pathology with benign anastomotic mucosa, no dysplasia.   Marland Kitchen drainage of back abscess  2007  . ESOPHAGOGASTRODUODENOSCOPY  04/2009  noncritical schatzki's ring, small hh, sb bx negative  . ESOPHAGOGASTRODUODENOSCOPY (EGD) WITH PROPOFOL N/A 02/06/2017   Dr. Gala Romney: mild schatzki's ring at GE junction s/p dilation, small hiatal hernia, multiple 3 mm pedunculated and sessile polyps in gastric fundus, normal duodenum  . MALONEY  DILATION N/A 02/06/2017   Procedure: Venia Minks DILATION;  Surgeon: Daneil Dolin, MD;  Location: [facility];  Service: Endoscopy;  Laterality: N/A;  . POLYPECTOMY  02/06/2017   Procedure: POLYPECTOMY;  Surgeon: Daneil Dolin, MD;  Location: [facility];  Service: Endoscopy;;  gastric  . SMALL INTESTINE SURGERY  [phone]     Current Meds  Medication Sig  . acetaminophen (TYLENOL) 500 MG tablet Take 1,000 mg by mouth every 6 (six) hours as needed for moderate pain or headache.  . azaTHIOprine (IMURAN) 50 MG tablet TAKE 2 & 1/2 TABLET ONCE DAILY.  Marland Kitchen Biotin 10 MG CAPS Take 20 mg by mouth daily.  . busPIRone (BUSPAR) 10 MG tablet Take 1 tablet (10 mg total) by mouth 2 (two) times daily.  . Calcium Carbonate-Vitamin D (CALCIUM 600+D PO) Take 1 capsule by mouth 3 (three) times daily.   . Cholecalciferol (EQL VITAMIN D3) 2000 units CAPS Take 2,000 Units by mouth once a week.   . cyanocobalamin (,VITAMIN B-12,) 1000 MCG/ML injection Inject 1,000 mcg into the muscle every 14 (fourteen) days.  . cyclobenzaprine (FLEXERIL) 10 MG tablet Take 10 mg by mouth 3 (three) times daily.   Marland Kitchen DEXILANT 60 MG capsule TAKE 1 CAPSULE BY MOUTH ONCE DAILY.  Marland Kitchen donepezil (ARICEPT) 10 MG tablet TAKE 1 TABLET DAILY.  . DULoxetine (CYMBALTA) 60 MG capsule Take 1 capsule (60 mg total) by mouth daily.  . ergocalciferol (VITAMIN D2) 50000 units capsule Take 50,000 Units by mouth every Sunday.  . gabapentin (NEURONTIN) 400 MG capsule Take 1 tab in AM, 3 tabs at bedtime  . hydrocortisone 2.5 % cream as needed.   Marland Kitchen ketoconazole (NIZORAL) 2 % cream Apply 1 application topically daily as needed for irritation.  Marland Kitchen lisinopril-hydrochlorothiazide (PRINZIDE,ZESTORETIC) 20-12.5 MG per tablet Take 1 tablet by mouth daily.  . mesalamine (PENTASA) 500 MG CR capsule TAKE (2) CAPSULES FOUR TIMES DAILY.  Marland Kitchen Omega-3 Fatty Acids (FISH OIL PO) Take 1 capsule by mouth at bedtime.  Marland Kitchen oxyCODONE (ROXICODONE) 15 MG immediate release tablet  Take 15 mg by mouth every 6 (six) hours as needed for pain.   . OXYCONTIN 10 MG 12 hr tablet Take 10 mg by mouth every 12 (twelve) hours.   . temazepam (RESTORIL) 15 MG capsule Take 2 capsules (30 mg total) by mouth at bedtime.  Marland Kitchen VITAMIN E PO Take 1 Units by mouth 2 (two) times daily.   Marland Kitchen zinc gluconate 50 MG tablet Take 50 mg by mouth daily.       Family History  Problem Relation Age of Onset  . Crohn's disease Mother        succumbed to stomach cancer in her 19s  . Stomach cancer Mother   . Heart attack Father   . COPD Father   . Colon cancer Neg Hx     Social History   Socioeconomic History  . Marital status: Legally Separated    Spouse name: Not on file  . Number of children: 2  . Years of education: 62  . Highest education level: 12th grade  Occupational History  . Occupation: disability    Employer: NOT EMPLOYED  Tobacco Use  . Smoking status: Never Smoker  .  Smokeless tobacco: Never Used  Substance and Sexual Activity  . Alcohol use: No  . Drug use: No  . Sexual activity: Yes    Birth control/protection: Post-menopausal  Other Topics Concern  . Not on file  Social History Narrative   Highest level of edu- 12th      Right handed      Lives in one story home. Son lives with her currently.   Social Determinants of Health   Financial Resource Strain:   . Difficulty of Paying Living Expenses:   Food Insecurity:   . Worried About Charity fundraiser in the Last Year:   . Arboriculturist in the Last Year:   Transportation Needs:   . Film/video editor (Medical):   Marland Kitchen Lack of Transportation (Non-Medical):   Physical Activity:   . Days of Exercise per Week:   . Minutes of Exercise per Session:   Stress:   . Feeling of Stress :   Social Connections:   . Frequency of Communication with Friends and Family:   . Frequency of Social Gatherings with Friends and Family:   . Attends Religious Services:   . Active Member of Clubs or Organizations:   . Attends  Archivist Meetings:   Marland Kitchen Marital Status:        Review of Systems: Gen: Denies fever, chills, anorexia. Denies fatigue, weakness, weight loss.  CV: Denies chest pain, palpitations, syncope, peripheral edema, and claudication. Resp: Denies dyspnea at rest, cough, wheezing, coughing up blood, and pleurisy. GI: see HPI Derm: Denies rash, itching, dry skin Psych: Denies depression, anxiety, [name] loss, confusion. No homicidal or suicidal ideation.  Heme: Denies bruising, bleeding, and enlarged lymph nodes.  Observations/Objective: No distress. Unable to perform physical exam due to telephone encounter. No video available.   Assessment and Plan: Very pleasant 69 year old female with history of ileocolonic Crohn's disease in clinical remission on Imuran and Pentasa. Last colonoscopy 2016. No concerning lower or upper GI signs/symptoms. GERD well-controlled on Dexilant once daily. Reviewed recent labs from Hematology. 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  I have refilled Imuran, Pentasa, and Dexilant today. Return in 6 months or sooner if needed.    Follow Up Instructions:    I discussed the assessment and treatment plan with the patient. The patient was provided an opportunity to ask questions and all were answered. The patient agreed with the plan and demonstrated an understanding of the instructions.   The patient was advised to call back or seek an in-person evaluation if the symptoms worsen or if the condition fails to improve as anticipated.  I provided 25 minutes of non-face-to-face time during this encounter.  Annitta Needs, PhD, ANP-BC [facility]

## 2019-04-27 ENCOUNTER — Encounter: Payer: Self-pay | Admitting: Internal Medicine

## 2019-04-27 ENCOUNTER — Encounter: Payer: Self-pay | Admitting: Gastroenterology

## 2019-04-27 ENCOUNTER — Ambulatory Visit (INDEPENDENT_AMBULATORY_CARE_PROVIDER_SITE_OTHER): Payer: Medicare Other | Admitting: Gastroenterology

## 2019-04-27 ENCOUNTER — Other Ambulatory Visit: Payer: Self-pay

## 2019-04-27 DIAGNOSIS — K219 Gastro-esophageal reflux disease without esophagitis: Secondary | ICD-10-CM | POA: Diagnosis not present

## 2019-04-27 DIAGNOSIS — K5080 Crohn's disease of both small and large intestine without complications: Secondary | ICD-10-CM | POA: Diagnosis not present

## 2019-04-27 MED ORDER — MESALAMINE ER 500 MG PO CPCR: 1000.0000 mg | ORAL_CAPSULE | Freq: Four times a day (QID) | ORAL | 3 refills | Status: DC

## 2019-04-27 MED ORDER — DEXILANT 60 MG PO CPDR: 1.0000 | DELAYED_RELEASE_CAPSULE | Freq: Every day | ORAL | 3 refills | Status: DC

## 2019-04-27 MED ORDER — AZATHIOPRINE 50 MG PO TABS: 125.0000 mg | ORAL_TABLET | Freq: Every day | ORAL | 3 refills | Status: DC

## 2019-04-27 NOTE — Patient Instructions (Signed)
I have refilled medications for you.  We will see you in 6 months!  It was great talking to you!  Annitta Needs, PhD, ANP-BC [facility]

## 2019-04-28 DIAGNOSIS — Z79899 Other long term (current) drug therapy: Secondary | ICD-10-CM | POA: Diagnosis not present

## 2019-04-28 DIAGNOSIS — G8929 Other chronic pain: Secondary | ICD-10-CM | POA: Diagnosis not present

## 2019-04-28 DIAGNOSIS — M5441 Lumbago with sciatica, right side: Secondary | ICD-10-CM | POA: Diagnosis not present

## 2019-04-28 DIAGNOSIS — K219 Gastro-esophageal reflux disease without esophagitis: Secondary | ICD-10-CM | POA: Insufficient documentation

## 2019-04-28 DIAGNOSIS — M542 Cervicalgia: Secondary | ICD-10-CM | POA: Diagnosis not present

## 2019-04-28 DIAGNOSIS — M5442 Lumbago with sciatica, left side: Secondary | ICD-10-CM | POA: Diagnosis not present

## 2019-05-12 ENCOUNTER — Other Ambulatory Visit: Payer: Self-pay | Admitting: Nurse Practitioner

## 2019-05-12 DIAGNOSIS — K219 Gastro-esophageal reflux disease without esophagitis: Secondary | ICD-10-CM

## 2019-05-20 ENCOUNTER — Other Ambulatory Visit: Payer: Self-pay | Admitting: Gastroenterology

## 2019-05-26 DIAGNOSIS — R110 Nausea: Secondary | ICD-10-CM | POA: Diagnosis not present

## 2019-05-26 DIAGNOSIS — M5441 Lumbago with sciatica, right side: Secondary | ICD-10-CM | POA: Diagnosis not present

## 2019-05-26 DIAGNOSIS — Z79899 Other long term (current) drug therapy: Secondary | ICD-10-CM | POA: Diagnosis not present

## 2019-05-26 DIAGNOSIS — G8929 Other chronic pain: Secondary | ICD-10-CM | POA: Diagnosis not present

## 2019-05-26 DIAGNOSIS — M5442 Lumbago with sciatica, left side: Secondary | ICD-10-CM | POA: Diagnosis not present

## 2019-05-27 ENCOUNTER — Encounter: Payer: Self-pay | Admitting: Gastroenterology

## 2019-06-01 ENCOUNTER — Telehealth (INDEPENDENT_AMBULATORY_CARE_PROVIDER_SITE_OTHER): Payer: Medicare Other | Admitting: Psychiatry

## 2019-06-01 ENCOUNTER — Other Ambulatory Visit: Payer: Self-pay

## 2019-06-01 DIAGNOSIS — F330 Major depressive disorder, recurrent, mild: Secondary | ICD-10-CM | POA: Diagnosis not present

## 2019-06-01 DIAGNOSIS — F419 Anxiety disorder, unspecified: Secondary | ICD-10-CM
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
## 2019-06-07 ENCOUNTER — Other Ambulatory Visit: Payer: Self-pay | Admitting: Nurse Practitioner

## 2019-06-07 DIAGNOSIS — K219 Gastro-esophageal reflux disease without esophagitis: Secondary | ICD-10-CM

## 2019-06-15 ENCOUNTER — Other Ambulatory Visit: Payer: Self-pay

## 2019-06-15 ENCOUNTER — Ambulatory Visit (INDEPENDENT_AMBULATORY_CARE_PROVIDER_SITE_OTHER): Payer: Medicare Other | Admitting: Psychiatry

## 2019-06-15 DIAGNOSIS — F330 Major depressive disorder, recurrent, mild: Secondary | ICD-10-CM
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
## 2019-06-21 ENCOUNTER — Other Ambulatory Visit: Payer: Self-pay

## 2019-06-21 ENCOUNTER — Telehealth (HOSPITAL_COMMUNITY): Payer: Self-pay | Admitting: Psychiatry

## 2019-06-21 ENCOUNTER — Telehealth (HOSPITAL_COMMUNITY): Payer: Medicare Other | Admitting: Psychiatry

## 2019-06-21 NOTE — Telephone Encounter (Signed)
Called the patient  twice for appointment scheduled today. The patient did not answer the phone. Left voice message to contact the office.

## 2019-06-23 DIAGNOSIS — M5442 Lumbago with sciatica, left side: Secondary | ICD-10-CM | POA: Diagnosis not present

## 2019-06-23 DIAGNOSIS — R110 Nausea: Secondary | ICD-10-CM | POA: Diagnosis not present

## 2019-06-23 DIAGNOSIS — M5441 Lumbago with sciatica, right side: Secondary | ICD-10-CM | POA: Diagnosis not present

## 2019-06-23 DIAGNOSIS — G8929 Other chronic pain: Secondary | ICD-10-CM | POA: Diagnosis not present

## 2019-06-23 DIAGNOSIS — Z79899 Other long term (current) drug therapy: Secondary | ICD-10-CM | POA: Diagnosis not present

## 2019-06-25 ENCOUNTER — Telehealth (HOSPITAL_COMMUNITY): Payer: Self-pay | Admitting: Psychiatry

## 2019-06-25 NOTE — Telephone Encounter (Signed)
Therapist returned call to [employer] and did clinical review with [facility] Advocate, Bennie Pierini.
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
## 2019-06-29 ENCOUNTER — Other Ambulatory Visit: Payer: Self-pay

## 2019-06-29 ENCOUNTER — Telehealth: Payer: Self-pay | Admitting: Neurology

## 2019-06-29 ENCOUNTER — Ambulatory Visit (INDEPENDENT_AMBULATORY_CARE_PROVIDER_SITE_OTHER): Payer: Medicare Other | Admitting: Psychiatry

## 2019-06-29 DIAGNOSIS — F330 Major depressive disorder, recurrent, mild: Secondary | ICD-10-CM

## 2019-06-29 NOTE — Telephone Encounter (Signed)
Patient called in stating she needs a prescription for her insurance to cover her wrist and hand braces left and right. Please send to [place] phone number is [phone].
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
## 2019-06-30 ENCOUNTER — Other Ambulatory Visit: Payer: Self-pay

## 2019-06-30 DIAGNOSIS — G629 Polyneuropathy, unspecified: Secondary | ICD-10-CM

## 2019-06-30 DIAGNOSIS — G5601 Carpal tunnel syndrome, right upper limb: Secondary | ICD-10-CM

## 2019-06-30 NOTE — Telephone Encounter (Signed)
Faxed to [employer]

## 2019-07-01 ENCOUNTER — Other Ambulatory Visit: Payer: Self-pay

## 2019-07-01 ENCOUNTER — Telehealth (HOSPITAL_COMMUNITY): Payer: Self-pay | Admitting: Psychiatry

## 2019-07-01 ENCOUNTER — Telehealth (HOSPITAL_COMMUNITY): Payer: Medicare Other | Admitting: Psychiatry

## 2019-07-01 NOTE — Telephone Encounter (Signed)
Called the patient  twice for appointment scheduled today. The patient did not answer the phone. Left voice message to contact the office.

## 2019-07-06 DIAGNOSIS — L570 Actinic keratosis: Secondary | ICD-10-CM | POA: Diagnosis not present

## 2019-07-20 ENCOUNTER — Other Ambulatory Visit: Payer: Self-pay

## 2019-07-20 ENCOUNTER — Ambulatory Visit (INDEPENDENT_AMBULATORY_CARE_PROVIDER_SITE_OTHER): Payer: Medicare Other | Admitting: Psychiatry

## 2019-07-20 DIAGNOSIS — F419 Anxiety disorder, unspecified: Secondary | ICD-10-CM | POA: Diagnosis not present

## 2019-07-20 DIAGNOSIS — F330 Major depressive disorder, recurrent, mild: Secondary | ICD-10-CM | POA: Diagnosis not present
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
## 2019-07-21 DIAGNOSIS — M25561 Pain in right knee: Secondary | ICD-10-CM | POA: Diagnosis not present

## 2019-07-21 DIAGNOSIS — G8929 Other chronic pain: Secondary | ICD-10-CM | POA: Diagnosis not present

## 2019-07-21 DIAGNOSIS — M542 Cervicalgia: Secondary | ICD-10-CM | POA: Diagnosis not present

## 2019-07-21 DIAGNOSIS — M25562 Pain in left knee: Secondary | ICD-10-CM | POA: Diagnosis not present

## 2019-07-21 DIAGNOSIS — Z79899 Other long term (current) drug therapy: Secondary | ICD-10-CM | POA: Diagnosis not present

## 2019-07-22 ENCOUNTER — Telehealth (HOSPITAL_COMMUNITY): Payer: Self-pay | Admitting: *Deleted

## 2019-07-22 ENCOUNTER — Other Ambulatory Visit (HOSPITAL_COMMUNITY): Payer: Self-pay | Admitting: Psychiatry

## 2019-07-22 MED ORDER — BUSPIRONE HCL 10 MG PO TABS: 10.0000 mg | ORAL_TABLET | Freq: Two times a day (BID) | ORAL | 0 refills | Status: DC

## 2019-07-22 MED ORDER — DULOXETINE HCL 60 MG PO CPEP: 60.0000 mg | ORAL_CAPSULE | Freq: Every day | ORAL | 0 refills | Status: DC

## 2019-07-22 NOTE — Telephone Encounter (Signed)
Patient called and [facility] stating she was recently discharged from provider. Per pt on voicemail, she stated that the letter stated she was allowed to still get refills from provider 30 days after the first day of dismissal. Per pt she is needing refills for all her medications that provider prescribes her. Patient number is [id number]

## 2019-07-22 NOTE — Telephone Encounter (Signed)
Ordered duloxetine and buspar refill for a month only. Please advise her to seek another provider to get care/refills moving forward. Noted that Temazepam is not ordered this time as she received Xanax from other provider according to the database.

## 2019-07-22 NOTE — Telephone Encounter (Signed)
LMOM

## 2019-08-03 ENCOUNTER — Ambulatory Visit (HOSPITAL_COMMUNITY): Payer: Medicare Other | Admitting: Psychiatry

## 2019-08-11 ENCOUNTER — Ambulatory Visit (INDEPENDENT_AMBULATORY_CARE_PROVIDER_SITE_OTHER): Payer: Medicare Other | Admitting: Psychiatry

## 2019-08-11 ENCOUNTER — Other Ambulatory Visit: Payer: Self-pay

## 2019-08-11 DIAGNOSIS — K219 Gastro-esophageal reflux disease without esophagitis: Secondary | ICD-10-CM | POA: Diagnosis not present

## 2019-08-11 DIAGNOSIS — I10 Essential (primary) hypertension: Secondary | ICD-10-CM | POA: Diagnosis not present

## 2019-08-11 DIAGNOSIS — F330 Major depressive disorder, recurrent, mild: Secondary | ICD-10-CM | POA: Diagnosis not present
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
## 2019-08-18 ENCOUNTER — Encounter (HOSPITAL_COMMUNITY): Payer: Self-pay | Admitting: Psychiatry

## 2019-08-18 DIAGNOSIS — G8929 Other chronic pain: Secondary | ICD-10-CM | POA: Diagnosis not present

## 2019-08-18 DIAGNOSIS — M25561 Pain in right knee: Secondary | ICD-10-CM | POA: Diagnosis not present

## 2019-08-18 DIAGNOSIS — M25562 Pain in left knee: Secondary | ICD-10-CM | POA: Diagnosis not present

## 2019-08-18 DIAGNOSIS — Z79899 Other long term (current) drug therapy: Secondary | ICD-10-CM | POA: Diagnosis not present

## 2019-08-18 DIAGNOSIS — M5442 Lumbago with sciatica, left side: Secondary | ICD-10-CM | POA: Diagnosis not present

## 2019-08-25 ENCOUNTER — Other Ambulatory Visit: Payer: Self-pay

## 2019-08-25 ENCOUNTER — Telehealth (HOSPITAL_COMMUNITY): Payer: Self-pay | Admitting: Psychiatry

## 2019-08-25 ENCOUNTER — Ambulatory Visit (INDEPENDENT_AMBULATORY_CARE_PROVIDER_SITE_OTHER): Payer: Medicare Other | Admitting: Psychiatry

## 2019-08-25 ENCOUNTER — Other Ambulatory Visit (HOSPITAL_COMMUNITY): Payer: Self-pay | Admitting: Psychiatry

## 2019-08-25 ENCOUNTER — Telehealth (HOSPITAL_COMMUNITY): Payer: Self-pay | Admitting: *Deleted

## 2019-08-25 DIAGNOSIS — F419 Anxiety disorder, unspecified: Secondary | ICD-10-CM | POA: Diagnosis not present

## 2019-08-25 DIAGNOSIS — F330 Major depressive disorder, recurrent, mild: Secondary | ICD-10-CM | POA: Diagnosis not present

## 2019-08-25 MED ORDER — DULOXETINE HCL 60 MG PO CPEP: 60.0000 mg | ORAL_CAPSULE | Freq: Every day | ORAL | 0 refills | Status: DC

## 2019-08-25 NOTE — Telephone Encounter (Signed)
Therapist called patient for scheduled appointment and received a voicemail message.  Therapist left message indicating attempt and requesting patient call office.
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
## 2019-08-25 NOTE — Telephone Encounter (Signed)
Called patient not realizing that she was discharged from Dr. Modesta Messing due to refill request. On paper refill request it stated that fill in provider wanted staff to all patient to schedule f/u appt with Dr. Modesta Messing not realizing patient was discharged. Staff called patient and spoke with patient attempting to scheduling an appt. Staff then informed her that she was not able to schedule the appt due to being discharged and to fine another provider before her medication runs out that the fill in provider just sent to the pharmacy because office was not able to refill anymore for her due to being discharged back in May 2021. Patient verbalized understanding and stated ok.

## 2019-09-06 ENCOUNTER — Telehealth: Payer: Self-pay | Admitting: *Deleted

## 2019-09-06 NOTE — Telephone Encounter (Signed)
Spoke with pt. Pt hasn't been taking probiotic Florajen for 1 month. Pt says it's suppose be kept in the refrigerator but pts refrigerator went out. Pts son that is healthy was taking the probiotic and said the medication made him have diarrhea and an upset stomach. Pt has been having clear/watery diarrhea x 1 week. Pt was having 10 Bms a day and it has reduced to 8 BM. Pt had some nausea and took Phenergan which helped her. Pt wasn't sure if her crhons was flaring. Pt feels better when drinking Gatorade and eating a sandwich. When pt drinks fluids now, she doesn't feel the medication isn't going through her. Pt states it has slowed down. Pt was advised to go to the ED if she feels her symptoms are worsened. Pt doesn't want to go to the ED due to sickness there and states she hasn't taken the Covid vaccine and doesn't plan to. Pt hasn't taken any antidiarrhea meds to try to stop the diarrhea. Pt takes Dexilant, Pentasa and Imuran as directed daily. Pt was advised again, if she needs to go to the ED or starts vomiting, she needs to get to the ED.

## 2019-09-06 NOTE — Telephone Encounter (Signed)
Pt is going out of town end of the week. She took probiotic OTC that you keep in the fridge and she has been taking it without it being refrigerated. She has started having diarrhea. She has been drinking a lot of Gatorade. 3-8 episodes per day, watery. Able to eat very little the past 3 days.

## 2019-09-07 ENCOUNTER — Other Ambulatory Visit: Payer: Self-pay

## 2019-09-07 ENCOUNTER — Telehealth: Payer: Self-pay | Admitting: Internal Medicine

## 2019-09-07 DIAGNOSIS — Z79899 Other long term (current) drug therapy: Secondary | ICD-10-CM

## 2019-09-07 DIAGNOSIS — R197 Diarrhea, unspecified: Secondary | ICD-10-CM

## 2019-09-07 NOTE — Telephone Encounter (Signed)
Spoke with pt. Pt continues to have watery diarrhea. Pt was asked to complete stool studies. Stool studies were placed for [facility] per pts request. Pt is aware that she will be notified of results when stool studies are completed.

## 2019-09-07 NOTE — Telephone Encounter (Signed)
If still diarrhea that is significant, needs Cdiff and GI pathogen panel. Otherwise, sounds like it may have been self-limiting gastroenteritis.

## 2019-09-07 NOTE — Telephone Encounter (Signed)
Spoke with pts daughter. Lab orders were placed and released for lab work.

## 2019-09-07 NOTE — Telephone Encounter (Signed)
We can place CBC and CMP orders to have done.

## 2019-09-07 NOTE — Telephone Encounter (Signed)
Pt's daughter called asking if [facility] would order a complete lab work up on patient so when she goes to drop off the stool sample she can have it done then. She is wanting it done ASAP before she goes out of town. [phone]

## 2019-09-08 ENCOUNTER — Ambulatory Visit (INDEPENDENT_AMBULATORY_CARE_PROVIDER_SITE_OTHER): Payer: Medicare Other | Admitting: Psychiatry

## 2019-09-08 ENCOUNTER — Other Ambulatory Visit: Payer: Self-pay

## 2019-09-08 DIAGNOSIS — F330 Major depressive disorder, recurrent, mild: Secondary | ICD-10-CM

## 2019-09-08 LAB — CBC WITH DIFFERENTIAL/PLATELET
Basophils Absolute: 0.1 10*3/uL (ref 0.0–0.2)
Basos: 1 %
EOS (ABSOLUTE): 0.1 10*3/uL (ref 0.0–0.4)
Eos: 1 %
Hematocrit: 37.6 % (ref 34.0–46.6)
Hemoglobin: 12.9 g/dL (ref 11.1–15.9)
Immature Grans (Abs): 0.1 10*3/uL (ref 0.0–0.1)
Immature Granulocytes: 1 %
Lymphocytes Absolute: 2.1 10*3/uL (ref 0.7–3.1)
Lymphs: 19 %
MCH: 34.7 pg — ABNORMAL HIGH (ref 26.6–33.0)
MCHC: 34.3 g/dL (ref 31.5–35.7)
MCV: 101 fL — ABNORMAL HIGH (ref 79–97)
Monocytes Absolute: 1.5 10*3/uL — ABNORMAL HIGH (ref 0.1–0.9)
Monocytes: 14 %
Neutrophils Absolute: 6.8 10*3/uL (ref 1.4–7.0)
Neutrophils: 64 %
Platelets: 293 10*3/uL (ref 150–450)
RBC: 3.72 x10E6/uL — ABNORMAL LOW (ref 3.77–5.28)
RDW: 13.3 % (ref 11.7–15.4)
WBC: 10.6 10*3/uL (ref 3.4–10.8)

## 2019-09-08 LAB — COMPREHENSIVE METABOLIC PANEL
ALT: 23 IU/L (ref 0–32)
AST: 26 IU/L (ref 0–40)
Albumin/Globulin Ratio: 1.6 (ref 1.2–2.2)
Albumin: 4.5 g/dL (ref 3.8–4.8)
Alkaline Phosphatase: 141 IU/L — ABNORMAL HIGH (ref 48–121)
BUN/Creatinine Ratio: 20 (ref 12–28)
BUN: 16 mg/dL (ref 8–27)
Bilirubin Total: 2.4 mg/dL — ABNORMAL HIGH (ref 0.0–1.2)
CO2: 23 mmol/L (ref 20–29)
Calcium: 10.4 mg/dL — ABNORMAL HIGH (ref 8.7–10.3)
Chloride: 99 mmol/L (ref 96–106)
Creatinine, Ser: 0.79 mg/dL (ref 0.57–1.00)
GFR calc Af Amer: 89 mL/min/{1.73_m2} (ref >59)
GFR calc non Af Amer: 77 mL/min/{1.73_m2} (ref >59)
Globulin, Total: 2.9 g/dL (ref 1.5–4.5)
Glucose: 101 mg/dL — ABNORMAL HIGH (ref 65–99)
Potassium: 4.6 mmol/L (ref 3.5–5.2)
Sodium: 137 mmol/L (ref 134–144)
Total Protein: 7.4 g/dL (ref 6.0–8.5)
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
## 2019-09-09 ENCOUNTER — Other Ambulatory Visit: Payer: Self-pay | Admitting: Gastroenterology

## 2019-09-09 ENCOUNTER — Telehealth: Payer: Self-pay

## 2019-09-09 DIAGNOSIS — M25561 Pain in right knee: Secondary | ICD-10-CM | POA: Diagnosis not present

## 2019-09-09 DIAGNOSIS — M5442 Lumbago with sciatica, left side: Secondary | ICD-10-CM | POA: Diagnosis not present

## 2019-09-09 DIAGNOSIS — Z79899 Other long term (current) drug therapy: Secondary | ICD-10-CM | POA: Diagnosis not present

## 2019-09-09 DIAGNOSIS — G8929 Other chronic pain: Secondary | ICD-10-CM | POA: Diagnosis not present

## 2019-09-09 DIAGNOSIS — M25562 Pain in left knee: Secondary | ICD-10-CM | POA: Diagnosis not present

## 2019-09-09 NOTE — Telephone Encounter (Signed)
VM received. Pt went to get a refill on Pentasa and was out of refills. Pt is headed out of town and would like Pentasa -Take 2 capsules (1,000 mg total) by mouth 4 (four) times daily refilled.

## 2019-09-10 MED ORDER — MESALAMINE ER 500 MG PO CPCR: 1000.0000 mg | ORAL_CAPSULE | Freq: Four times a day (QID) | ORAL | 3 refills | Status: DC

## 2019-09-10 NOTE — Addendum Note (Signed)
Addended by: Annitta Needs on: 09/10/2019 11:12 AM   Modules accepted: Orders

## 2019-09-10 NOTE — Telephone Encounter (Signed)
Completed.

## 2019-09-13 ENCOUNTER — Other Ambulatory Visit: Payer: Self-pay

## 2019-09-13 DIAGNOSIS — R748 Abnormal levels of other serum enzymes: Secondary | ICD-10-CM

## 2019-09-15 DIAGNOSIS — M5442 Lumbago with sciatica, left side: Secondary | ICD-10-CM | POA: Diagnosis not present

## 2019-09-15 DIAGNOSIS — G8929 Other chronic pain: Secondary | ICD-10-CM | POA: Diagnosis not present

## 2019-10-04 ENCOUNTER — Telehealth (INDEPENDENT_AMBULATORY_CARE_PROVIDER_SITE_OTHER): Payer: Medicare Other | Admitting: Neurology

## 2019-10-04 ENCOUNTER — Other Ambulatory Visit: Payer: Self-pay

## 2019-10-04 ENCOUNTER — Encounter: Payer: Self-pay | Admitting: Neurology

## 2019-10-04 VITALS — Ht 63.0 in | Wt 200.0 lb

## 2019-10-04 DIAGNOSIS — G629 Polyneuropathy, unspecified: Secondary | ICD-10-CM | POA: Diagnosis not present

## 2019-10-04 DIAGNOSIS — G3184 Mild cognitive impairment, so stated: Secondary | ICD-10-CM
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
## 2019-10-05 ENCOUNTER — Other Ambulatory Visit: Payer: Self-pay

## 2019-10-05 ENCOUNTER — Inpatient Hospital Stay (HOSPITAL_COMMUNITY): Payer: Medicare Other | Attending: Hematology

## 2019-10-05 DIAGNOSIS — K760 Fatty (change of) liver, not elsewhere classified: Secondary | ICD-10-CM | POA: Insufficient documentation

## 2019-10-05 DIAGNOSIS — K529 Noninfective gastroenteritis and colitis, unspecified: Secondary | ICD-10-CM | POA: Insufficient documentation

## 2019-10-05 DIAGNOSIS — Z79899 Other long term (current) drug therapy: Secondary | ICD-10-CM | POA: Insufficient documentation

## 2019-10-05 DIAGNOSIS — D7589 Other specified diseases of blood and blood-forming organs: Secondary | ICD-10-CM | POA: Insufficient documentation

## 2019-10-05 LAB — CBC WITH DIFFERENTIAL/PLATELET
Abs Immature Granulocytes: 0.02 10*3/uL (ref 0.00–0.07)
Basophils Absolute: 0 10*3/uL (ref 0.0–0.1)
Basophils Relative: 1 %
Eosinophils Absolute: 0.2 10*3/uL (ref 0.0–0.5)
Eosinophils Relative: 4 %
HCT: 36.3 % (ref 36.0–46.0)
Hemoglobin: 12.2 g/dL (ref 12.0–15.0)
Immature Granulocytes: 0 %
Lymphocytes Relative: 23 %
Lymphs Abs: 1.5 10*3/uL (ref 0.7–4.0)
MCH: 34.7 pg — ABNORMAL HIGH (ref 26.0–34.0)
MCHC: 33.6 g/dL (ref 30.0–36.0)
MCV: 103.1 fL — ABNORMAL HIGH (ref 80.0–100.0)
Monocytes Absolute: 0.7 10*3/uL (ref 0.1–1.0)
Monocytes Relative: 10 %
Neutro Abs: 4.1 10*3/uL (ref 1.7–7.7)
Neutrophils Relative %: 62 %
Platelets: 238 10*3/uL (ref 150–400)
RBC: 3.52 MIL/uL — ABNORMAL LOW (ref 3.87–5.11)
RDW: 13.2 % (ref 11.5–15.5)
WBC: 6.6 10*3/uL (ref 4.0–10.5)
nRBC: 0 % (ref 0.0–0.2)

## 2019-10-05 LAB — COMPREHENSIVE METABOLIC PANEL
ALT: 22 U/L (ref 0–44)
AST: 27 U/L (ref 15–41)
Albumin: 3.6 g/dL (ref 3.5–5.0)
Alkaline Phosphatase: 102 U/L (ref 38–126)
Anion gap: 8 (ref 5–15)
BUN: 17 mg/dL (ref 8–23)
CO2: 25 mmol/L (ref 22–32)
Calcium: 9.5 mg/dL (ref 8.9–10.3)
Chloride: 103 mmol/L (ref 98–111)
Creatinine, Ser: 0.6 mg/dL (ref 0.44–1.00)
GFR calc Af Amer: >60 mL/min (ref >60)
GFR calc non Af Amer: >60 mL/min (ref >60)
Glucose, Bld: 108 mg/dL — ABNORMAL HIGH (ref 70–99)
Potassium: 4.3 mmol/L (ref 3.5–5.1)
Sodium: 136 mmol/L (ref 135–145)
Total Bilirubin: 2.4 mg/dL — ABNORMAL HIGH (ref 0.3–1.2)
Total Protein: 7.3 g/dL (ref 6.5–8.1)

## 2019-10-05 LAB — FERRITIN: Ferritin: 105 ng/mL (ref 11–307)

## 2019-10-05 LAB — VITAMIN B12: Vitamin B-12: 3487 pg/mL — ABNORMAL HIGH (ref 180–914)

## 2019-10-05 LAB — IRON AND TIBC
Iron: 146 ug/dL (ref 28–170)
Saturation Ratios: 36 % — ABNORMAL HIGH (ref 10.4–31.8)
TIBC: 408 ug/dL (ref 250–450)
UIBC: 262 ug/dL

## 2019-10-05 LAB — LACTATE DEHYDROGENASE: LDH: 148 U/L (ref 98–192)

## 2019-10-05 LAB — VITAMIN D 25 HYDROXY (VIT D DEFICIENCY, FRACTURES): Vit D, 25-Hydroxy: 75.84 ng/mL (ref 30–100)

## 2019-10-05 MED ORDER — GABAPENTIN 400 MG PO CAPS: ORAL_CAPSULE | ORAL | 3 refills | Status: DC

## 2019-10-05 MED ORDER — DONEPEZIL HCL 10 MG PO TABS: ORAL_TABLET | ORAL | 3 refills | Status: DC

## 2019-10-06 ENCOUNTER — Inpatient Hospital Stay (HOSPITAL_BASED_OUTPATIENT_CLINIC_OR_DEPARTMENT_OTHER): Payer: Medicare Other | Admitting: Nurse Practitioner

## 2019-10-06 DIAGNOSIS — D7589 Other specified diseases of blood and blood-forming organs: Secondary | ICD-10-CM | POA: Diagnosis not present

## 2019-10-06 NOTE — Progress Notes (Signed)
[facility] Follow up:    Monico Blitz, MD [address] [place] [address]   DIAGNOSIS: Macrocytosis with mild anemia  CURRENT THERAPY: Observation  INTERVAL HISTORY: Sheri [name] 69 y.o. female was called for a telephone visit for macrocytosis without anemia.  Patient reports she has been doing well since her last visit.  She did have 1 episode of colitis flareup.  It has since resolved.  Otherwise she is doing well. Denies any nausea, vomiting, or diarrhea. Denies any new pains. Had not noticed any recent bleeding such as epistaxis, hematuria or hematochezia. Denies recent chest pain on exertion, shortness of breath on minimal exertion, pre-syncopal episodes, or palpitations. Denies any numbness or tingling in hands or feet. Denies any recent fevers, infections, or recent hospitalizations. Patient reports appetite at 100% and energy level at 100%.    Patient Active Problem List   Diagnosis Date Noted  . GERD 04/28/2019  . MDD (major depressive disorder), recurrent episode, mild ([place]) 03/04/2019  . Neurocognitive deficits 03/04/2019  . Macrocytosis without anemia 11/28/2017  . AK (actinic keratosis) 09/02/2017  . Anxiety 09/02/2017  . Carotid bruit 09/02/2017  . Dementia ([place]) 09/02/2017  . Vitamin D deficiency 09/02/2017  . Vitamin B 12 deficiency 09/02/2017  . Vitamin B2 deficiency 09/02/2017  . Scabies exposure 09/02/2017  . Right knee pain 09/02/2017  . Obesity 09/02/2017  . Former smoker 09/02/2017  . Memory loss 09/02/2017  . 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 Infestation by Sarcoptes scabiei 09/02/2017  . Iatrogenic ovarian failure 09/02/2017  . Hypercholesterolemia 09/02/2017  . Ganglion of right wrist 09/02/2017  . Frequent urination 09/02/2017  . Dysphagia 12/31/2016  . Dyspepsia 12/31/2016  . Depression 09/23/2016  . Cervicogenic headache 09/23/2016  . Mild cognitive impairment 06/21/2016  . Hyperreflexia 06/21/2016  . Major depressive  disorder, single episode 04/02/2016  . Dysuria 01/09/2011  . Crohn's disease of both small and large intestine without complication ([place]) 55/20/8022  . ABDOMINAL PAIN 03/30/2009  . GERD 04/29/2008  . OSTEOPENIA 04/29/2008  . ABSCESS, SPINE 09/11/2007  . Essential hypertension 09/11/2007  . CROHN'S DISEASE, SMALL INTESTINE 09/11/2007  . SKIN LESION 09/11/2007  . DIARRHEA 09/11/2007  . RECTAL BLEEDING, HX OF 09/11/2007  . FIBROMYALGIA 11/19/2005  . HYPERGLYCEMIA, HX OF 11/19/2005  . LOW BACK PAIN 11/12/2005  . INFECTION, STAPHYLOCOCCUS AUREUS 04/08/2005  . OSTEOMYELITIS, ACUTE, OTHER [facility] SITE 04/08/2005    is allergic to codeine sulfate.  MEDICAL HISTORY: Past Medical History:  Diagnosis Date  . Anemia   . Chronic back pain   . Chronic neck pain   . Complication of anesthesia   . Crohn disease ([place])   . Fibromyalgia   . GERD (gastroesophageal reflux disease)   . Hypertension   . Low back pain   . Macrocytosis without anemia 11/28/2017  . Osteopenia    Last DEXA 12/2010 was normal, on fosamax  . PONV (postoperative nausea and vomiting)   . Skin cancer    sees Dr. Tarri Glenn in [place]    SURGICAL HISTORY: Past Surgical History:  Procedure Laterality Date  . APPENDECTOMY  1990  . BREAST EXCISIONAL BIOPSY Right   . CATARACT EXTRACTION W/PHACO Left 01/13/2014   Procedure: CATARACT EXTRACTION PHACO AND INTRAOCULAR LENS PLACEMENT LEFT EYE;  Surgeon: Tonny Branch, MD;  Location: [facility];  Service: Ophthalmology;  Laterality: Left;  CDE:6.90  . CATARACT EXTRACTION W/PHACO Right 01/24/2014   Procedure: CATARACT EXTRACTION PHACO AND INTRAOCULAR LENS PLACEMENT (IOC);  Surgeon: Tonny Branch,  MD;  Location: [facility];  Service: Ophthalmology;  Laterality: Right;  CDE:5.91  . CESAREAN SECTION  [id number]  . COLONOSCOPY  09/11/2006  . COLONOSCOPY  10/30/2010   RMR: External and internal hemorrhoids, likely source of hematochezia  otherwise normal rectum/ Left-sided diverticula, status post prior  right hemicolectomy with a friable, inflamed, stenotic surgical anastomosis with upstream dilation of the neoterminal ileum/ Crohn's noted, status post biopsy  . COLONOSCOPY N/A 06/23/2014   Dr. Rourk:friable eroded mucosa at the anastomosis with small bowel, consistent with some stenosis Crohn's disease s/p biopsy. Colonic diverticulosis and external hemorrhoids. Pathology with benign anastomotic mucosa, no dysplasia.   Marland Kitchen drainage of back abscess  2007  . ESOPHAGOGASTRODUODENOSCOPY  04/2009   noncritical schatzki's ring, small hh, sb bx negative  . ESOPHAGOGASTRODUODENOSCOPY (EGD) WITH PROPOFOL N/A 02/06/2017   Dr. Gala Romney: mild schatzki's ring at GE junction s/p dilation, small hiatal hernia, multiple 3 mm pedunculated and sessile polyps in gastric fundus, normal duodenum  . MALONEY DILATION N/A 02/06/2017   Procedure: Venia Minks DILATION;  Surgeon: Daneil Dolin, MD;  Location: [facility];  Service: Endoscopy;  Laterality: N/A;  . POLYPECTOMY  02/06/2017   Procedure: POLYPECTOMY;  Surgeon: Daneil Dolin, MD;  Location: [facility];  Service: Endoscopy;;  gastric  . SMALL INTESTINE SURGERY  [phone]    SOCIAL HISTORY: Social History   Socioeconomic History  . Marital status: Legally Separated    Spouse name: Not on file  . Number of children: 2  . Years of education: 25  . Highest education level: 12th grade  Occupational History  . Occupation: disability    Employer: NOT EMPLOYED  Tobacco Use  . Smoking status: Never Smoker  . Smokeless tobacco: Never Used  Vaping Use  . Vaping Use: Never used  Substance and Sexual Activity  . Alcohol use: No  . Drug use: No  . Sexual activity: Yes    Birth control/protection: Post-menopausal  Other Topics Concern  . Not on file  Social History Narrative   Highest level of edu- 12th      Right handed      Lives in one story home. Son lives with her currently.   Social Determinants of Health   Financial Resource Strain:   .  Difficulty of Paying Living Expenses: Not on file  Food Insecurity:   . Worried About Charity fundraiser in the Last Year: Not on file  . Ran Out of Food in the Last Year: Not on file  Transportation Needs:   . Lack of Transportation (Medical): Not on file  . Lack of Transportation (Non-Medical): Not on file  Physical Activity:   . Days of Exercise per Week: Not on file  . Minutes of Exercise per Session: Not on file  Stress:   . Feeling of Stress : Not on file  Social Connections:   . Frequency of Communication with Friends and Family: Not on file  . Frequency of Social Gatherings with Friends and Family: Not on file  . Attends Religious Services: Not on file  . Active Member of Clubs or Organizations: Not on file  . Attends Archivist Meetings: Not on file  . Marital Status: Not on file  Intimate Partner Violence:   . Fear of Current or Ex-Partner: Not on file  . Emotionally Abused: Not on file  . Physically Abused: Not on file  . Sexually Abused: Not on file    FAMILY HISTORY: Family History  Problem Relation  Age of Onset  . Crohn's disease Mother        succumbed to stomach cancer in her 38s  . Stomach cancer Mother   . Heart attack Father   . COPD Father   . Colon cancer Neg Hx     Review of Systems  All other systems reviewed and are negative.    Vital signs: -Deferred to telephone visit  Physical Exam -Deferred due to telephone visit -Patient was alert and oriented with phone in no acute distress   LABORATORY DATA:  CBC    Component Value Date/Time   WBC 6.6 10/05/2019 1429   RBC 3.52 (L) 10/05/2019 1429   HGB 12.2 10/05/2019 1429   HGB 12.9 09/07/2019 1451   HCT 36.3 10/05/2019 1429   HCT 37.6 09/07/2019 1451   HCT 38 03/11/2014 0000   PLT 238 10/05/2019 1429   PLT 293 09/07/2019 1451   MCV 103.1 (H) 10/05/2019 1429   MCV 101 (H) 09/07/2019 1451   MCV 97.1 12/10/2010 0959   MCH 34.7 (H) 10/05/2019 1429   MCHC 33.6 10/05/2019 1429    RDW 13.2 10/05/2019 1429   RDW 13.3 09/07/2019 1451   LYMPHSABS 1.5 10/05/2019 1429   LYMPHSABS 2.1 09/07/2019 1451   MONOABS 0.7 10/05/2019 1429   EOSABS 0.2 10/05/2019 1429   EOSABS 0.1 09/07/2019 1451   BASOSABS 0.0 10/05/2019 1429   BASOSABS 0.1 09/07/2019 1451    CMP     Component Value Date/Time   NA 136 10/05/2019 1429   NA 137 09/07/2019 1450   K 4.3 10/05/2019 1429   CL 103 10/05/2019 1429   CO2 25 10/05/2019 1429   GLUCOSE 108 (H) 10/05/2019 1429   BUN 17 10/05/2019 1429   BUN 16 09/07/2019 1450   CREATININE 0.60 10/05/2019 1429   CREATININE 0.89 03/03/2017 1513   CALCIUM 9.5 10/05/2019 1429   PROT 7.3 10/05/2019 1429   PROT 7.4 09/07/2019 1450   ALBUMIN 3.6 10/05/2019 1429   ALBUMIN 4.5 09/07/2019 1450   AST 27 10/05/2019 1429   AST 21 04/03/2011 0850   ALT 22 10/05/2019 1429   ALKPHOS 102 10/05/2019 1429   ALKPHOS 60 04/03/2011 0850   BILITOT 2.4 (H) 10/05/2019 1429   BILITOT 2.4 (H) 09/07/2019 1450   GFRNONAA >60 10/05/2019 1429   GFRNONAA 68 03/03/2017 1513   GFRAA >60 10/05/2019 1429   GFRAA 78 03/03/2017 1513    All questions were answered to patient's stated satisfaction. Encouraged patient to call with any new concerns or questions before his next visit to the cancer center and we can certain see him sooner, if needed.     ASSESSMENT and THERAPY PLAN:   Macrocytosis without anemia 1.  Macrocytosis without anemia: -Likely secondary medication versus hepatic steatosis, with negative peripheral hematology work-up. -Medications include pentasa and Imuran. -Patient denies any bleeding or dark stools.  She denies any easy bruising, fatigue, night sweats or unexplained weight loss. -Labs done on 10/05/2019 showed hemoglobin 12.2, MVC 103.1, WBC 6.6, and platelets 238, ferritin 105, percent saturation 36. -She will follow-up in 7 months with repeat labs.   Orders Placed This Encounter  Procedures  . CBC with Differential/Platelet    Standing  Status:   Future    Standing Expiration Date:   10/05/2020  . Comprehensive metabolic panel    Standing Status:   Future    Standing Expiration Date:   10/05/2020  . Ferritin    Standing Status:   Future  Standing Expiration Date:   10/05/2020  . Iron and TIBC    Standing Status:   Future    Standing Expiration Date:   10/05/2020  . Lactate dehydrogenase    Standing Status:   Future    Standing Expiration Date:   10/05/2020  . Vitamin B12    Standing Status:   Future    Standing Expiration Date:   10/05/2020  . VITAMIN D 25 Hydroxy (Vit-D Deficiency, Fractures)    Standing Status:   Future    Standing Expiration Date:   10/05/2020  . Folate    Standing Status:   Future    Standing Expiration Date:   10/05/2020    All questions were answered. The patient knows to call the clinic with any problems, questions or concerns. We can certainly see the patient much sooner if necessary. This note was electronically signed.  I provided 28 minutes of non face-to-face telephone visit time during this encounter, and > 50% was spent counseling as documented under my assessment & plan.   Glennie Isle, NP-C 10/06/2019

## 2019-10-06 NOTE — Assessment & Plan Note (Signed)
1.  Macrocytosis without anemia: -Likely secondary medication versus hepatic steatosis, with negative peripheral hematology work-up. -Medications include pentasa and Imuran. -Patient denies any bleeding or dark stools.  She denies any easy bruising, fatigue, night sweats or unexplained weight loss. -Labs done on 10/05/2019 showed hemoglobin 12.2, MVC 103.1, WBC 6.6, and platelets 238, ferritin 105, percent saturation 36. -She will follow-up in 7 months with repeat labs.

## 2019-10-07 DIAGNOSIS — Z79899 Other long term (current) drug therapy: Secondary | ICD-10-CM | POA: Diagnosis not present

## 2019-10-07 DIAGNOSIS — G8929 Other chronic pain: Secondary | ICD-10-CM | POA: Diagnosis not present

## 2019-10-07 DIAGNOSIS — M25562 Pain in left knee: Secondary | ICD-10-CM | POA: Diagnosis not present

## 2019-10-07 DIAGNOSIS — M25561 Pain in right knee: Secondary | ICD-10-CM | POA: Diagnosis not present

## 2019-10-07 DIAGNOSIS — M542 Cervicalgia: Secondary | ICD-10-CM | POA: Diagnosis not present

## 2019-10-13 DIAGNOSIS — D0461 Carcinoma in situ of skin of right upper limb, including shoulder: Secondary | ICD-10-CM | POA: Diagnosis not present

## 2019-10-13 DIAGNOSIS — L570 Actinic keratosis: Secondary | ICD-10-CM | POA: Diagnosis not present

## 2019-10-13 DIAGNOSIS — C44622 Squamous cell carcinoma of skin of right upper limb, including shoulder: Secondary | ICD-10-CM | POA: Diagnosis not present

## 2019-10-13 DIAGNOSIS — C44629 Squamous cell carcinoma of skin of left upper limb, including shoulder: Secondary | ICD-10-CM | POA: Diagnosis not present

## 2019-10-13 DIAGNOSIS — C44729 Squamous cell carcinoma of skin of left lower limb, including hip: Secondary | ICD-10-CM | POA: Diagnosis not present

## 2019-10-28 DIAGNOSIS — C44729 Squamous cell carcinoma of skin of left lower limb, including hip: Secondary | ICD-10-CM | POA: Diagnosis not present

## 2019-11-02 ENCOUNTER — Other Ambulatory Visit: Payer: Self-pay

## 2019-11-02 ENCOUNTER — Encounter: Payer: Self-pay | Admitting: Gastroenterology

## 2019-11-02 ENCOUNTER — Telehealth (INDEPENDENT_AMBULATORY_CARE_PROVIDER_SITE_OTHER): Payer: Medicare Other | Admitting: Gastroenterology

## 2019-11-02 ENCOUNTER — Telehealth: Payer: Self-pay | Admitting: *Deleted

## 2019-11-02 DIAGNOSIS — Z299 Encounter for prophylactic measures, unspecified: Secondary | ICD-10-CM | POA: Diagnosis not present

## 2019-11-02 DIAGNOSIS — K5010 Crohn's disease of large intestine without complications: Secondary | ICD-10-CM | POA: Diagnosis not present

## 2019-11-02 DIAGNOSIS — K5080 Crohn's disease of both small and large intestine without complications: Secondary | ICD-10-CM

## 2019-11-02 DIAGNOSIS — R300 Dysuria: Secondary | ICD-10-CM | POA: Diagnosis not present

## 2019-11-02 DIAGNOSIS — I10 Essential (primary) hypertension: Secondary | ICD-10-CM | POA: Diagnosis not present

## 2019-11-02 DIAGNOSIS — N390 Urinary tract infection, site not specified: Secondary | ICD-10-CM | POA: Diagnosis not present

## 2019-11-02 NOTE — Progress Notes (Signed)
Primary Care Physician:  Monico Blitz, MD  Primary GI: Dr. Gala Romney   Patient Location: Home   Provider Location: [facility] office   Reason for Visit: Follow-up   Persons present on the virtual encounter, with roles: Patient and NP   Total time (minutes) spent on medical discussion: 25 minutes   Due to COVID-19, visit was conducted using virtual method.  Visit was requested by patient.  Virtual Visit via Telephone Note Due to COVID-19, visit is conducted virtually and was requested by patient.   I connected with Sheri [name] on 11/03/19 at  1:30 PM EDT by telephone and verified that I am speaking with the correct person using two identifiers.   I discussed the limitations, risks, security and privacy concerns of performing an evaluation and management service by telephone and the availability of in person appointments. I also discussed with the patient that there may be a patient responsible charge related to this service. The patient expressed understanding and agreed to proceed.  Chief Complaint  Patient presents with  . Crohn's Disease    f/u. Doing fine     History of Present Illness: 69 year old female with a history ofileocolonic Crohn's disease dating back many years on Imuran and Pentasa.Due to rectal bleeding in May 2016, underwent updated colonoscopy. Notedfriable eroded mucosa at the anastomosis with small bowel, consistent with some stenosis Crohn's disease s/p biopsy. Colonic diverticulosis and external hemorrhoids. Pathology with benign anastomotic mucosa, no dysplasia. Here for routine follow-up visit via telephone.   Flare of diarrhea July 2021 after taking a probiotic that was supposed to be refrigerated but was not. No rectal bleeding. Diarrhea now resolved. No abdominal pain. Good appetite. No N/V. No dysphagia.   Recent labs with Hematology reviewed. Chronic macrocytosis. No IDA noted. DEXA scan on file from this year.   Past Medical History:  Diagnosis  Date  . Anemia   . Chronic back pain   . Chronic neck pain   . Complication of anesthesia   . Crohn disease ([place])   . Fibromyalgia   . GERD (gastroesophageal reflux disease)   . Hypertension   . Low back pain   . Macrocytosis without anemia 11/28/2017  . Osteopenia    Last DEXA 12/2010 was normal, on fosamax  . PONV (postoperative nausea and vomiting)   . Skin cancer    sees Dr. Tarri Glenn in [place]     Past Surgical History:  Procedure Laterality Date  . APPENDECTOMY  1990  . BREAST EXCISIONAL BIOPSY Right   . CATARACT EXTRACTION W/PHACO Left 01/13/2014   Procedure: CATARACT EXTRACTION PHACO AND INTRAOCULAR LENS PLACEMENT LEFT EYE;  Surgeon: Tonny Branch, MD;  Location: [facility];  Service: Ophthalmology;  Laterality: Left;  CDE:6.90  . CATARACT EXTRACTION W/PHACO Right 01/24/2014   Procedure: CATARACT EXTRACTION PHACO AND INTRAOCULAR LENS PLACEMENT (IOC);  Surgeon: Tonny Branch, MD;  Location: [facility];  Service: Ophthalmology;  Laterality: Right;  CDE:5.91  . CESAREAN SECTION  [id number]  . COLONOSCOPY  09/11/2006  . COLONOSCOPY  10/30/2010   RMR: External and internal hemorrhoids, likely source of hematochezia  otherwise normal rectum/ Left-sided diverticula, status post prior right hemicolectomy with a friable, inflamed, stenotic surgical anastomosis with upstream dilation of the neoterminal ileum/ Crohn's noted, status post biopsy  . COLONOSCOPY N/A 06/23/2014   Dr. Rourk:friable eroded mucosa at the anastomosis with small bowel, consistent with some stenosis Crohn's disease s/p biopsy. Colonic diverticulosis and external hemorrhoids. Pathology with benign anastomotic mucosa, no  dysplasia.   Marland Kitchen drainage of back abscess  2007  . ESOPHAGOGASTRODUODENOSCOPY  04/2009   noncritical schatzki's ring, small hh, sb bx negative  . ESOPHAGOGASTRODUODENOSCOPY (EGD) WITH PROPOFOL N/A 02/06/2017   Dr. Gala Romney: mild schatzki's ring at GE junction s/p dilation, small hiatal hernia, multiple 3 mm pedunculated and  sessile polyps in gastric fundus, normal duodenum  . MALONEY DILATION N/A 02/06/2017   Procedure: Venia Minks DILATION;  Surgeon: Daneil Dolin, MD;  Location: [facility];  Service: Endoscopy;  Laterality: N/A;  . POLYPECTOMY  02/06/2017   Procedure: POLYPECTOMY;  Surgeon: Daneil Dolin, MD;  Location: [facility];  Service: Endoscopy;;  gastric  . SMALL INTESTINE SURGERY  [phone]     Current Meds  Medication Sig  . acetaminophen (TYLENOL) 500 MG tablet Take 1,000 mg by mouth every 6 (six) hours as needed for moderate pain or headache.  . azaTHIOprine (IMURAN) 50 MG tablet TAKE 2 & 1/2 TABLETS DAILY.  Marland Kitchen Biotin 10 MG CAPS Take 20 mg by mouth daily.  . busPIRone (BUSPAR) 10 MG tablet Take 1 tablet (10 mg total) by mouth 2 (two) times daily.  . Calcium Carbonate-Vitamin D (CALCIUM 600+D PO) Take 1 capsule by mouth 3 (three) times daily.   . Cholecalciferol (EQL VITAMIN D3) 2000 units CAPS Take 2,000 Units by mouth once a week.   . cyanocobalamin (,VITAMIN B-12,) 1000 MCG/ML injection Inject 1,000 mcg into the muscle every 14 (fourteen) days.  . cyclobenzaprine (FLEXERIL) 10 MG tablet Take 10 mg by mouth 3 (three) times daily.   Marland Kitchen dexlansoprazole (DEXILANT) 60 MG capsule Take 1 capsule (60 mg total) by mouth daily.  Marland Kitchen donepezil (ARICEPT) 10 MG tablet TAKE 1 TABLET DAILY.  . DULoxetine (CYMBALTA) 60 MG capsule Take 1 capsule (60 mg total) by mouth daily.  . ergocalciferol (VITAMIN D2) 50000 units capsule Take 50,000 Units by mouth every Sunday.  . gabapentin (NEURONTIN) 400 MG capsule Take 1 tab in AM, 3 tabs at bedtime  . hydrocortisone 2.5 % cream as needed.   Marland Kitchen ketoconazole (NIZORAL) 2 % cream Apply 1 application topically daily as needed for irritation.  Marland Kitchen lisinopril-hydrochlorothiazide (PRINZIDE,ZESTORETIC) 20-12.5 MG per tablet Take 1 tablet by mouth daily.  . mesalamine (PENTASA) 500 MG CR capsule Take 2 capsules (1,000 mg total) by mouth 4 (four) times daily. TAKE (2) CAPSULES  FOUR TIMES DAILY.  Marland Kitchen Omega-3 Fatty Acids (FISH OIL PO) Take 1 capsule by mouth at bedtime.  Marland Kitchen oxyCODONE (ROXICODONE) 15 MG immediate release tablet Take 15 mg by mouth every 6 (six) hours as needed for pain.   . OXYCONTIN 10 MG 12 hr tablet Take 10 mg by mouth every 12 (twelve) hours.   . temazepam (RESTORIL) 15 MG capsule Take 2 capsules (30 mg total) by mouth at bedtime.  Marland Kitchen VITAMIN E PO Take 1 Units by mouth 2 (two) times daily.   Marland Kitchen zinc gluconate 50 MG tablet Take 50 mg by mouth daily.       Family History  Problem Relation Age of Onset  . Crohn's disease Mother        succumbed to stomach cancer in her 8s  . Stomach cancer Mother   . Heart attack Father   . COPD Father   . Colon cancer Neg Hx     Social History   Socioeconomic History  . Marital status: Legally Separated    Spouse name: Not on file  . Number of children: 2  . Years of education:  12  . Highest education level: 12th grade  Occupational History  . Occupation: disability    Employer: NOT EMPLOYED  Tobacco Use  . Smoking status: Never Smoker  . Smokeless tobacco: Never Used  Vaping Use  . Vaping Use: Never used  Substance and Sexual Activity  . Alcohol use: No  . Drug use: No  . Sexual activity: Yes    Birth control/protection: Post-menopausal  Other Topics Concern  . Not on file  Social History Narrative   Highest level of edu- 12th      Right handed      Lives in one story home. Son lives with her currently.   Social Determinants of Health   Financial Resource Strain:   . Difficulty of Paying Living Expenses: Not on file  Food Insecurity:   . Worried About Charity fundraiser in the Last Year: Not on file  . Ran Out of Food in the Last Year: Not on file  Transportation Needs:   . Lack of Transportation (Medical): Not on file  . Lack of Transportation (Non-Medical): Not on file  Physical Activity:   . Days of Exercise per Week: Not on file  . Minutes of Exercise per Session: Not on file   Stress:   . Feeling of Stress : Not on file  Social Connections:   . Frequency of Communication with Friends and Family: Not on file  . Frequency of Social Gatherings with Friends and Family: Not on file  . Attends Religious Services: Not on file  . Active Member of Clubs or Organizations: Not on file  . Attends Archivist Meetings: Not on file  . Marital Status: Not on file       Review of Systems: Gen: Denies fever, chills, anorexia. Denies fatigue, weakness, weight loss.  CV: Denies chest pain, palpitations, syncope, peripheral edema, and claudication. Resp: Denies dyspnea at rest, cough, wheezing, coughing up blood, and pleurisy. GI: see HPI Derm: Denies rash, itching, dry skin Psych: Denies depression, anxiety, [name] loss, confusion. No homicidal or suicidal ideation.  Heme: Denies bruising, bleeding, and enlarged lymph nodes.  Observations/Objective: No distress. Unable to perform physical exam due to telephone encounter. No video available.   Assessment and Plan: Pleasant 69 year old female with history of ileocolonic Crohn's disease dating back many years on Imuran and Pentasa, presenting via telephone for routine follow-up. 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 No evidence for IDA. Clinically doing well at time of visit, with flare of diarrhea several months ago seeming to be related to taking probiotics that were supposed to be refrigerated but were not (son had same diarrhea after ingesting).   Last colonoscopy in 2016with friable eroded mucosa at the anastomosis with small bowel, consistent with some stenosis Crohn's disease s/p biopsy. Colonic diverticulosis and external hemorrhoids. Pathology with benign anastomotic mucosa, no dysplasia. Prior right hemicolectomy.   Will go ahead and arrange colonoscopy now for routine purposes in light of Crohn's disease involving part of colon (unclear how much involved as surgery was many years ago.  Continue with Imuran and Pentasa. DEXA scan on file from this year.   Proceed with colonoscopy by Dr. Gala Romney in near future using Propofol: the risks, benefits, and alternatives have been discussed with the patient in detail. The patient states understanding and desires to proceed.   6 month return.   Follow Up Instructions:    I discussed the assessment and treatment plan with the patient. The patient was provided an opportunity to  ask questions and all were answered. The patient agreed with the plan and demonstrated an understanding of the instructions.   The patient was advised to call back or seek an in-person evaluation if the symptoms worsen or if the condition fails to improve as anticipated.  I provided 25 minutes of non-face-to-face time during this encounter.  Annitta Needs, PhD, ANP-BC [facility]

## 2019-11-02 NOTE — Telephone Encounter (Signed)
Pt consented to a virtual visit. 

## 2019-11-02 NOTE — Telephone Encounter (Signed)
Sheri [name], you are scheduled for a virtual visit with your provider today.  Just as we do with appointments in the office, we must obtain your consent to participate.  Your consent will be active for this visit and any virtual visit you may have with one of our providers in the next 365 days.  If you have a MyChart account, I can also send a copy of this consent to you electronically.  All virtual visits are billed to your insurance company just like a traditional visit in the office.  As this is a virtual visit, video technology does not allow for your provider to perform a traditional examination.  This may limit your provider's ability to fully assess your condition.  If your provider identifies any concerns that need to be evaluated in person or the need to arrange testing such as labs, EKG, etc, we will make arrangements to do so.  Although advances in technology are sophisticated, we cannot ensure that it will always work on either your end or our end.  If the connection with a video visit is poor, we may have to switch to a telephone visit.  With either a video or telephone visit, we are not always able to ensure that we have a secure connection.   I need to obtain your verbal consent now.   Are you willing to proceed with your visit today?

## 2019-11-02 NOTE — Patient Instructions (Signed)
We are arranging a colonoscopy in the near future.  I will see you in 6 months!  I enjoyed seeing you again today! As you know, I value our relationship and want to provide genuine, compassionate, and quality care. I welcome your feedback. If you receive a survey regarding your visit,  I greatly appreciate you taking time to fill this out. See you next time!  Annitta Needs, PhD, ANP-BC [facility]

## 2019-11-03 DIAGNOSIS — M25561 Pain in right knee: Secondary | ICD-10-CM | POA: Diagnosis not present

## 2019-11-03 DIAGNOSIS — M25562 Pain in left knee: Secondary | ICD-10-CM | POA: Diagnosis not present

## 2019-11-03 DIAGNOSIS — G8929 Other chronic pain: Secondary | ICD-10-CM | POA: Diagnosis not present

## 2019-11-03 DIAGNOSIS — Z79899 Other long term (current) drug therapy: Secondary | ICD-10-CM | POA: Diagnosis not present

## 2019-11-03 DIAGNOSIS — M542 Cervicalgia: Secondary | ICD-10-CM | POA: Diagnosis not present

## 2019-11-04 ENCOUNTER — Other Ambulatory Visit: Payer: Self-pay | Admitting: *Deleted

## 2019-11-04 ENCOUNTER — Telehealth: Payer: Self-pay | Admitting: *Deleted

## 2019-11-04 NOTE — Telephone Encounter (Signed)
Called pt. She has been scheduled for TCS with Dr. Gala Romney, propofol, asa 3 on 12/13 at 9:45am. Patient aware will mail instructions,. Confirmed address.

## 2019-11-05 ENCOUNTER — Encounter: Payer: Self-pay | Admitting: *Deleted

## 2019-11-11 DIAGNOSIS — K219 Gastro-esophageal reflux disease without esophagitis: Secondary | ICD-10-CM | POA: Diagnosis not present

## 2019-11-11 DIAGNOSIS — I10 Essential (primary) hypertension: Secondary | ICD-10-CM | POA: Diagnosis not present

## 2019-11-15 ENCOUNTER — Encounter: Payer: Self-pay | Admitting: *Deleted

## 2019-11-15 ENCOUNTER — Other Ambulatory Visit: Payer: Self-pay | Admitting: *Deleted

## 2019-11-15 DIAGNOSIS — R748 Abnormal levels of other serum enzymes: Secondary | ICD-10-CM

## 2019-11-17 ENCOUNTER — Ambulatory Visit (INDEPENDENT_AMBULATORY_CARE_PROVIDER_SITE_OTHER): Payer: Medicare Other | Admitting: Counselor

## 2019-11-17 ENCOUNTER — Other Ambulatory Visit: Payer: Self-pay

## 2019-11-17 ENCOUNTER — Encounter: Payer: Self-pay | Admitting: Counselor

## 2019-11-17 ENCOUNTER — Ambulatory Visit: Payer: Medicare Other | Admitting: Psychology

## 2019-11-17 DIAGNOSIS — G8929 Other chronic pain: Secondary | ICD-10-CM

## 2019-11-17 DIAGNOSIS — G3184 Mild cognitive impairment, so stated: Secondary | ICD-10-CM | POA: Diagnosis not present

## 2019-11-17 DIAGNOSIS — M545 Low back pain, unspecified: Secondary | ICD-10-CM

## 2019-11-17 DIAGNOSIS — Z79891 Long term (current) use of opiate analgesic: Secondary | ICD-10-CM

## 2019-11-17 DIAGNOSIS — F09 Unspecified mental disorder due to known physiological condition: Secondary | ICD-10-CM

## 2019-11-17 MED ORDER — WRIST BRACE MISC: 0 refills | Status: AC

## 2019-11-17 NOTE — Progress Notes (Signed)
   Psychometrist Note   Cognitive testing was administered to Sheri [name] by Sheri [name], B.S. (Technician) under the supervision of Sheri [name], Psy.D., ABN. [name] was able to tolerate all test procedures. Dr. Nicole [name] met with the patient as needed to manage any emotional reactions to the testing procedures. Rest breaks were offered.    The battery of tests administered was selected by Dr. Nicole [name] with consideration to the patient's current level of functioning, the nature of her symptoms, emotional and behavioral responses during the interview, level of literacy, observed level of motivation/effort, and the nature of the referral question. This battery was communicated to the psychometrist. Communication between Dr. Nicole [name] and the psychometrist was ongoing throughout the evaluation and Dr. Nicole [name] was immediately accessible at all times. Dr. Nicole [name] provided supervision to the technician on the date of this service, to the extent necessary to assure the quality of all services provided.    [name] will return in approximately one week for an interactive feedback session with Dr. Nicole [name], at which time test performance, clinical impressions, and treatment recommendations will be reviewed in detail. The patient understands she can contact our office should she require our assistance before this time.   A total of 100 minutes of billable time were spent with Sheri [name] by the technician, including test administration and scoring time. Billing for these services is reflected in Sheri [name] note.   This note reflects time spent with the psychometrician and does not include test scores, clinical history, or any interpretations made by Dr. Nicole [name]. The full report will follow in a separate note.
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
## 2019-11-18 NOTE — Progress Notes (Signed)
[facility]  Patient Name: WAUNETA SILVERIA MRN: [id number] Date of Birth: [birth year] Age: 69 y.o. Education: 12 years  Measurement properties of test scores: IQ, Index, and Standard Scores (SS): Mean = 100; Standard Deviation = 15 Scaled Scores (Ss): Mean = 10; Standard Deviation = 3 Z scores (Z): Mean = 0; Standard Deviation = 1 T scores (T); Mean = 50; Standard Deviation = 10  TEST SCORES:    Note: This summary of test scores accompanies the interpretive report and should not be interpreted by unqualified individuals or in isolation without reference to the report. Test scores are relative to age, gender, and educational history as available and appropriate.   Performance Validity        "A" Random Letter Test Raw  Descriptor      Errors 1 Within Expectation  The Dot Counting Test: 11 Within Expectation      Mental Status Screening     Total Score Descriptor  MoCA 25 Normal      Expected Functioning        Wide Range Achievement Test: Standard/Scaled Score Percentile      Word Reading 90 25      Attention/Processing Speed        Neuropsychological Assessment Battery (Attention Module, Form 1): T-score Percentile      Digits Forward 54 66      Digits Backwards 46 34      Repeatable Battery for the Assessment of Neuropsychological Status (Form A): Scaled Score Percentile      Coding 5 5      Language        Neuropsychological Assessment Battery (Language Module, Form 1): T-score Percentile      Naming   (31) 58 79      Verbal Fluency: T-score Percentile      Controlled Oral Word Association (F-A-S) 39 14      Semantic Fluency (Animals) 35 7      Memory:        Neuropsychological Assessment Battery (Memory Module, Form 1): T-score Percentile      List Learning           List A Immediate Recall   (7, 9, 9) 57 75         List B Immediate Recall   (3) 44 27         List A Short Delayed Recall   (4) 37 9         List A Long  Delayed Recall   (6) 47 38         List A Percent Retention   (150 %) --- 97         List A Long Delayed Yes/No Recognition Hits   (10) --- 31         List A Long Delayed Yes/No Recognition False Alarms   (5) --- 31         List A Recognition Discriminability Index --- 25     Story Learning           Immediate Recall   (26, 38) 53 62         Delayed Recall   (37) 62 88         Percent Retention   (97 %) --- 66      Daily Living Memory            Immediate Recall   (25, 16) 50 50  Delayed Recall   (9, 7) 62 88          Percent Retention (107 %) --- 91          Recognition Hits    (9) --- 58      Repeatable Battery for the Assessment of Neuropsychological Status (Form A): Scaled Score Percentile         Figure Recall   (10) 8 25      Visuospatial/Constructional Functioning        Repeatable Battery for the Assessment of Neuropsychological Status (Form A): Standard/Scaled Score Percentile     Visuospatial/Constructional Index 96 39         Figure Copy   (19) 11 63         Judgment of Line Orientation   (15) --- 26-50      Executive Functioning        [employer] Test (MWCST): Standard/T-Score Percentile      Number of Categories Correct 39 14      Number of Perseverative Errors 43 25      Number of Total Errors 43 25      Percent Perseverative Errors 49 46  Executive Function Composite 85 16      Trail Making Test: T-Score Percentile      Part A 40 16      Part B 38 12      [place] Diagnostic Aphasia Exam: Raw Score Scaled Score      Complex Ideational Material 10 7      Clock Drawing Raw Score Descriptor      Command 9 WNL      Rating Scales         Raw Score Descriptor  Patient Health Questionnaire - 9 1 Within Normal Limits  GAD-7 3 Within Normal Limits      Clinical Dementia Rating Raw Score Descriptor      Sum of Boxes 0.5 MCI      Global Score 0.0 Normal   Shruthi Northrup V. Nicole Kindred PsyD, [place] Clinical Neuropsychologist

## 2019-11-19 NOTE — Progress Notes (Signed)
[facility]  Patient Name: Sheri [name] MRN: [id number] Date of Birth: 1951/01/12 Age: 69 y.o. 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 Since then, she is feeling better and also thinks her cognitive issues have resolved, she wanted to be reevaluated to see if she is doing better objectively. On neuropsychological assessment, Ms. Soltis performed better in nearly all areas assessed. Her memory is now presenting in the normal range with in fact very good high average range delayed recall for certain types of material including short stories and brief daily-living type information. A trend toward lower performance on some measures involving executive function/executive control (I.e., phonemic fluency, coding, and trail making test B) was noted but her performance is not frankly abnormal within that domain. This may represent normal intraindividuals variability or testing artifact. Performance was within gross limits of normal in all other areas including on measures of attention/working memory, visuospatial and constructional performance, and executive function. She screened negative for the presence of clinically significant depression or anxiety on self-report indicators.   Mr. Coker is thus demonstrating broadly normal objective cognitive performance, suggesting that her MCI was due primarily to her depression and that she has regained her previous level of function. She may have some very minor executive control type issues from medication side effects or residua of her depression (the literature suggests that cognitive deficits do not always improve when depression remits), but they are not particularly conspicuous on testing. Mr. Knapper will be reassured regarding her  cognitive performance. Recommend that she continue with her antidepressant medications, which have clearly been helpful. Recommend that she also initiate exercise, which is cognitively enhancing, cognitively protective, and also an effective intervention for depression. No further follow up is needed from a neuropsychological perspective unless clinically indicated in the future.   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 Test scores are relative to age, gender, and educational history as available and appropriate. There were no concerns about performance validity as all findings fell within normal expectations.   General Intellectual Functioning/Achievement:  Performance on single word reading was at the low end of the average range, which presents as a reasonable standard of comparison for her cognitive test performance.   Attention and Processing Efficiency: Performance on indicators of attention and working memory was average, which is viewed as within normal limits for her and is similar to her previous scores. She had unusually low performance, however, on timed-number symbol coding, which is lower than her performance at the last assessment on an analogous indicator. Intraindividual variability and/or premorbid strengths and weaknesses are suspected. Simple numeric sequencing was low average.   Language: Performance on visual object confrontation naming was errorless, which is the same as her previous score. She did demonstrate marginally weaker performance on phonemic fluency, which was low average. Semantic fluency was unchanged and continues to fall in the unusually low range but that is an isolated low score amongst otherwise reasonable performance.   Visuospatial  Function: Performance was normal on visuospatial and constructional measures with average range copy of a modestly complex figure and average judgment of angular line orientations.   Learning and Memory: Performance on learning and  memory measures was good, with the profile suggesting significant improvement as compared to her previous scores. She had no difficulties acquiring and retaining information across time with at least average scores on all indicators.   In the verbal realm, Ms. Furuya demonstrated high average immediate recall for a 12-item word list. Her short delayed recall was low average, which may be on the basis of retroactive interference, but her long-delayed recall score was average. She did well recognizing the target words using a yes/no recognition format with average performance. Memory for a brief short story was average on immediate recall and nearly errorless, in the high average range on delayed recall. Performance was similar when learning and then recalling brief daily-living type information, with average immediate recall and high average nearly errorless delayed recall. Recognition was average.   In the visual realm, Ms. Thornell demonstrated average delayed recall for a modestly complex geometric figure.   Executive Functions: There was a slight trend towards weak performance on executive measures with marginal low average findings on alternating sequencing of numbers and letters, generating words on the basis of specific letter cues, and on the Executive Function Composite of a rule-based categorization procedure involving card sorting. I do not think this represents bona-fide impairment and instead that she may have some minor inefficiency related to her previous depression and/or premorbid strengths and weaknesses. Clock drawing was within normal limits. Performance was low average when reasoning with verbal information on the complex ideational material.   Rating  Scale(s): Ms. Qualley screened negative for the presence of clinically significant anxiety or depressive symptoms on self-rating. I was able to rate a CDR for her using all available information, which is in the normal range, with a Global Score of 0.   Viviano Simas Nicole Kindred PsyD, [place] Clinical Neuropsychologist

## 2019-11-24 NOTE — Telephone Encounter (Signed)
This encounter was created in error - please disregard.

## 2019-11-25 ENCOUNTER — Ambulatory Visit (INDEPENDENT_AMBULATORY_CARE_PROVIDER_SITE_OTHER): Payer: Medicare Other | Admitting: Counselor

## 2019-11-25 ENCOUNTER — Encounter: Payer: Self-pay | Admitting: Counselor

## 2019-11-25 ENCOUNTER — Other Ambulatory Visit: Payer: Self-pay

## 2019-11-25 DIAGNOSIS — G5603 Carpal tunnel syndrome, bilateral upper limbs: Secondary | ICD-10-CM | POA: Diagnosis not present

## 2019-11-25 DIAGNOSIS — F325 Major depressive disorder, single episode, in full remission: Secondary | ICD-10-CM | POA: Diagnosis not present

## 2019-11-25 NOTE — Progress Notes (Signed)
NEUROPSYCHOLOGY FEEDBACK NOTE [facility]  Feedback Note: I met with Sheri Sheri [name] to review the findings resulting from her neuropsychological evaluation. Since the last appointment, she has been well. Her mood continues to be euthymic and her cognition is clear. Time was spent reviewing the impressions and recommendations that are detailed in the evaluation report. We discussed impression of normal cognitive function, with consideration of some possible slight executive control difficulties, which could be due to medication use. To be clear, her test performance is not clearly abnormal and areas of specific concern (I.e., Sheri [name]) have improved significantly. I provided her with a copy of her report, at her request. I took time to explain the findings and answer all the patient's questions. I encouraged Sheri Sheri [name] to contact me should she have any further questions or if further follow up is desired.   Current Medications and Medical History   Current Outpatient Medications  Medication Sig Dispense Refill  . acetaminophen (TYLENOL) 500 MG tablet Take 1,000 mg by mouth every 6 (six) hours as needed for moderate pain or headache.    . azaTHIOprine (IMURAN) 50 MG tablet TAKE 2 & 1/2 TABLETS DAILY. 75 tablet 5  . Biotin 10 MG CAPS Take 20 mg by mouth daily.    . busPIRone (BUSPAR) 10 MG tablet Take 1 tablet (10 mg total) by mouth 2 (two) times daily. 60 tablet 0  . Calcium Carbonate-Vitamin D (CALCIUM 600+D PO) Take 1 capsule by mouth 3 (three) times daily.     . Cholecalciferol (EQL VITAMIN D3) 2000 units CAPS Take 2,000 Units by mouth once a week.     . cyanocobalamin (,VITAMIN B-12,) 1000 MCG/ML injection Inject 1,000 mcg into the muscle every 14 (fourteen) days.    . cyclobenzaprine (FLEXERIL) 10 MG tablet Take 10 mg by mouth 3 (three) times daily.     Marland Kitchen dexlansoprazole (DEXILANT) 60 MG capsule Take 1 capsule (60 mg total) by mouth daily. 90 capsule 3  . donepezil (ARICEPT) 10 MG tablet  TAKE 1 TABLET DAILY. 90 tablet 3  . DULoxetine (CYMBALTA) 60 MG capsule Take 1 capsule (60 mg total) by mouth daily. 30 capsule 0  . ergocalciferol (VITAMIN D2) 50000 units capsule Take 50,000 Units by mouth every Sunday.    . gabapentin (NEURONTIN) 400 MG capsule Take 1 tab in AM, 3 tabs at bedtime 360 capsule 3  . hydrocortisone 2.5 % cream as needed.     Marland Kitchen ketoconazole (NIZORAL) 2 % cream Apply 1 application topically daily as needed for irritation.    Marland Kitchen lisinopril-hydrochlorothiazide (PRINZIDE,ZESTORETIC) 20-12.5 MG per tablet Take 1 tablet by mouth daily. 30 tablet 0  . mesalamine (PENTASA) 500 MG CR capsule Take 2 capsules (1,000 mg total) by mouth 4 (four) times daily. TAKE (2) CAPSULES FOUR TIMES DAILY. 720 capsule 3  . Misc. Devices (WRIST BRACE) MISC Use as support for wrist 1 each 0  . Omega-3 Fatty Acids (FISH OIL PO) Take 1 capsule by mouth at bedtime.    Marland Kitchen oxyCODONE (ROXICODONE) 15 MG immediate release tablet Take 15 mg by mouth every 6 (six) hours as needed for pain.     . OXYCONTIN 10 MG 12 hr tablet Take 10 mg by mouth every 12 (twelve) hours.     . temazepam (RESTORIL) 15 MG capsule Take 2 capsules (30 mg total) by mouth at bedtime. 60 capsule 2  . VITAMIN E PO Take 1 Units by mouth 2 (two) times daily.     Marland Kitchen zinc  gluconate 50 MG tablet Take 50 mg by mouth daily.       No current facility-administered medications for this visit.    Patient Active Problem List   Diagnosis Date Noted  . GERD 04/28/2019  . MDD (major depressive disorder), recurrent episode, mild ([place]) 03/04/2019  . Neurocognitive deficits 03/04/2019  . Macrocytosis without anemia 11/28/2017  . AK (actinic keratosis) 09/02/2017  . Anxiety 09/02/2017  . Carotid bruit 09/02/2017  . Dementia ([place]) 09/02/2017  . Vitamin D deficiency 09/02/2017  . Vitamin B 12 deficiency 09/02/2017  . Vitamin B2 deficiency 09/02/2017  . Scabies exposure 09/02/2017  . Right knee pain 09/02/2017  . Obesity 09/02/2017  .  Former smoker 09/02/2017  . Sheri [name] loss 09/02/2017  . 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 Infestation by Sarcoptes scabiei 09/02/2017  . Iatrogenic ovarian failure 09/02/2017  . Hypercholesterolemia 09/02/2017  . Ganglion of right wrist 09/02/2017  . Frequent urination 09/02/2017  . Dysphagia 12/31/2016  . Dyspepsia 12/31/2016  . Depression 09/23/2016  . Cervicogenic headache 09/23/2016  . Mild cognitive impairment 06/21/2016  . Hyperreflexia 06/21/2016  . Major depressive disorder, single episode 04/02/2016  . Dysuria 01/09/2011  . Crohn's disease of both small and large intestine without complication ([place]) 32/01/2481  . ABDOMINAL PAIN 03/30/2009  . GERD 04/29/2008  . OSTEOPENIA 04/29/2008  . ABSCESS, SPINE 09/11/2007  . Essential hypertension 09/11/2007  . CROHN'S DISEASE, SMALL INTESTINE 09/11/2007  . SKIN LESION 09/11/2007  . DIARRHEA 09/11/2007  . RECTAL BLEEDING, HX OF 09/11/2007  . FIBROMYALGIA 11/19/2005  . HYPERGLYCEMIA, HX OF 11/19/2005  . LOW BACK PAIN 11/12/2005  . INFECTION, STAPHYLOCOCCUS AUREUS 04/08/2005  . OSTEOMYELITIS, ACUTE, OTHER [facility] SITE 04/08/2005   Mental Status and Behavioral Observations  Sheri Sheri [name] presented on time to the present encounter and was alert and generally oriented. Speech was normal in rate, rhythm, volume, and prosody. Self-reported mood was "good" and affect was euthymic. Thought process was logical and goal oriented and thought content was appropriate to the topics discussed. There were no safety concerns identified at today's encounter, such as thoughts of harming self or others.   Plan  Feedback provided regarding the patient's neuropsychological evaluation. She is doing much better now that her depression has remitted. I explained to her that I am not licensed to prescribe or advise her directly about her medications, although her Aricept may no longer be needed if she wishes to come off it (it was an issue for her insurance  provider). I encouraged her to follow up with Dr. Delice [name] about it. Sheri Sheri [name] was encouraged to contact me if any questions arise or if further follow up is desired.   Viviano Simas Nicole Kindred, PsyD, ABN Clinical Neuropsychologist  Service(s) Provided at This Encounter: 31 minutes [phone]; Psychotherapy with patient/family)

## 2019-11-30 ENCOUNTER — Telehealth: Payer: Self-pay | Admitting: Neurology

## 2019-11-30 NOTE — Telephone Encounter (Signed)
Patient called and said, "I'd like to know Dr. Amparo Bristol thoughts on my testing I had with Dr. Nicole Kindred. I'd also like to know if she'll want to take me off of the Aricept now. If she does, my life insurance forms may need updated."

## 2019-11-30 NOTE — Telephone Encounter (Signed)
Pt informed that Dr Delice Lesch is very happy to see the results from Dr. Les Pou report. She can stop the Aricept now. Pt is asking if she get a letter or a copy of notes about the testing and coming off the Aricept,

## 2019-11-30 NOTE — Telephone Encounter (Signed)
Pls let her know I was very happy to see the results from Dr. Les Pou report. She can stop the Aricept now. Thanks

## 2019-12-01 DIAGNOSIS — G8929 Other chronic pain: Secondary | ICD-10-CM | POA: Diagnosis not present

## 2019-12-01 DIAGNOSIS — Z79899 Other long term (current) drug therapy: Secondary | ICD-10-CM | POA: Diagnosis not present

## 2019-12-01 DIAGNOSIS — M25562 Pain in left knee: Secondary | ICD-10-CM | POA: Diagnosis not present

## 2019-12-01 DIAGNOSIS — M542 Cervicalgia: Secondary | ICD-10-CM | POA: Diagnosis not present

## 2019-12-01 DIAGNOSIS — M25561 Pain in right knee: Secondary | ICD-10-CM | POA: Diagnosis not present

## 2019-12-06 DIAGNOSIS — R748 Abnormal levels of other serum enzymes: Secondary | ICD-10-CM | POA: Diagnosis not present

## 2019-12-07 LAB — HEPATIC FUNCTION PANEL
ALT: 19 IU/L (ref 0–32)
AST: 24 IU/L (ref 0–40)
Albumin: 4.3 g/dL (ref 3.8–4.8)
Alkaline Phosphatase: 131 IU/L — ABNORMAL HIGH (ref 44–121)
Bilirubin Total: 1.7 mg/dL — ABNORMAL HIGH (ref 0.0–1.2)
Bilirubin, Direct: 0.9 mg/dL — ABNORMAL HIGH (ref 0.00–0.40)
Total Protein: 7.1 g/dL (ref 6.0–8.5)

## 2019-12-13 ENCOUNTER — Telehealth: Payer: Self-pay | Admitting: Neurology

## 2019-12-13 NOTE — Telephone Encounter (Signed)
Patient called in stating she was wanting to follow up with Nira Conn on getting notes and/or a letter pertaining to the Aricept she was taken off of.

## 2019-12-13 NOTE — Telephone Encounter (Signed)
See phone note from 10/19 to see what she is asking for

## 2019-12-14 ENCOUNTER — Other Ambulatory Visit: Payer: Self-pay

## 2019-12-14 DIAGNOSIS — K7460 Unspecified cirrhosis of liver: Secondary | ICD-10-CM

## 2019-12-15 ENCOUNTER — Encounter: Payer: Self-pay | Admitting: Neurology

## 2019-12-15 NOTE — Telephone Encounter (Signed)
Pt called informed that letter was ready, per her request letter placed in the mail

## 2019-12-15 NOTE — Telephone Encounter (Signed)
Done, thanks

## 2019-12-15 NOTE — Telephone Encounter (Signed)
See other phone note

## 2019-12-27 DIAGNOSIS — Z79899 Other long term (current) drug therapy: Secondary | ICD-10-CM | POA: Diagnosis not present

## 2019-12-27 DIAGNOSIS — M25561 Pain in right knee: Secondary | ICD-10-CM | POA: Diagnosis not present

## 2019-12-27 DIAGNOSIS — M25562 Pain in left knee: Secondary | ICD-10-CM | POA: Diagnosis not present

## 2019-12-27 DIAGNOSIS — G8929 Other chronic pain: Secondary | ICD-10-CM | POA: Diagnosis not present

## 2019-12-27 DIAGNOSIS — M5442 Lumbago with sciatica, left side: Secondary | ICD-10-CM | POA: Diagnosis not present

## 2020-01-11 DIAGNOSIS — I10 Essential (primary) hypertension: Secondary | ICD-10-CM | POA: Diagnosis not present

## 2020-01-12 DIAGNOSIS — L570 Actinic keratosis: Secondary | ICD-10-CM | POA: Diagnosis not present

## 2020-01-12 DIAGNOSIS — C44629 Squamous cell carcinoma of skin of left upper limb, including shoulder: Secondary | ICD-10-CM | POA: Diagnosis not present

## 2020-01-12 DIAGNOSIS — D487 Neoplasm of uncertain behavior of other specified sites: Secondary | ICD-10-CM | POA: Diagnosis not present

## 2020-01-12 DIAGNOSIS — C44722 Squamous cell carcinoma of skin of right lower limb, including hip: Secondary | ICD-10-CM | POA: Diagnosis not present

## 2020-01-13 ENCOUNTER — Telehealth: Payer: Self-pay | Admitting: Internal Medicine

## 2020-01-13 NOTE — Telephone Encounter (Signed)
Patient called and said that she did not receive the instructions for her prep in mychart. Can you please send them again [phone]

## 2020-01-13 NOTE — Telephone Encounter (Signed)
Instructions sent via mychart

## 2020-01-14 NOTE — Patient Instructions (Signed)
Sheri [name]  01/14/2020     @PREFPERIOPPHARMACY @   Your procedure is scheduled on  01/24/2020.  Report to [facility] at  [facility].  Call this number if you have problems the morning of surgery:  [phone]   Remember:  Follow the diet and prep instructions given to you by the office.                      Take these medicines the morning of surgery with A SIP OF WATER  Imuran, buspar, flexeril(if needed), dexilant, aricept, cymbalta, gabapentin, roxicodone or oxycontin(if needed).    Do not wear jewelry, make-up or nail polish.  Do not wear lotions, powders, or perfumes. Please wear deodorant and brush your teeth.  Do not shave 48 hours prior to surgery.  Men may shave face and neck.  Do not bring valuables to the hospital.  [facility] is not responsible for any belongings or valuables.  Contacts, dentures or bridgework may not be worn into surgery.  Leave your suitcase in the car.  After surgery it may be brought to your room.  For patients admitted to the hospital, discharge time will be determined by your treatment team.  Patients discharged the day of surgery will not be allowed to drive home.   Name and phone number of your driver:   family Special instructions:  DO NOT smoke the morning of your procedure.  Please read over the following fact sheets that you were given. Anesthesia Post-op Instructions and Care and Recovery After Surgery       Colonoscopy, Adult, Care After This sheet gives you information about how to care for yourself after your procedure. Your health care provider may also give you more specific instructions. If you have problems or questions, contact your health care provider. What can I expect after the procedure? After the procedure, it is common to have:  A small amount of blood in your stool for 24 hours after the procedure.  Some gas.  Mild cramping or bloating of your abdomen. Follow these instructions at home: Eating and  drinking   Drink enough fluid to keep your urine pale yellow.  Follow instructions from your health care provider about eating or drinking restrictions.  Resume your normal diet as instructed by your health care provider. Avoid heavy or fried foods that are hard to digest. Activity  Rest as told by your health care provider.  Avoid sitting for a long time without moving. Get up to take short walks every 1-2 hours. This is important to improve blood flow and breathing. Ask for help if you feel weak or unsteady.  Return to your normal activities as told by your health care provider. Ask your health care provider what activities are safe for you. Managing cramping and bloating   Try walking around when you have cramps or feel bloated.  Apply heat to your abdomen as told by your health care provider. Use the heat source that your health care provider recommends, such as a moist heat pack or a heating pad. ? Place a towel between your skin and the heat source. ? Leave the heat on for 20-30 minutes. ? Remove the heat if your skin turns bright red. This is especially important if you are unable to feel pain, heat, or cold. You may have a greater risk of getting burned. General instructions  For the first 24 hours after the procedure: ? Do  not drive or use machinery. ? Do not sign important documents. ? Do not drink alcohol. ? Do your regular daily activities at a slower pace than normal. ? Eat soft foods that are easy to digest.  Take over-the-counter and prescription medicines only as told by your health care provider.  Keep all follow-up visits as told by your health care provider. This is important. Contact a health care provider if:  You have blood in your stool 2-3 days after the procedure. Get help right away if you have:  More than a small spotting of blood in your stool.  Large blood clots in your stool.  Swelling of your abdomen.  Nausea or vomiting.  A  fever.  Increasing pain in your abdomen that is not relieved with medicine. Summary  After the procedure, it is common to have a small amount of blood in your stool. You may also have mild cramping and bloating of your abdomen.  For the first 24 hours after the procedure, do not drive or use machinery, sign important documents, or drink alcohol.  Get help right away if you have a lot of blood in your stool, nausea or vomiting, a fever, or increased pain in your abdomen. This information is not intended to replace advice given to you by your health care provider. Make sure you discuss any questions you have with your health care provider. Document Revised: 08/24/2018 Document Reviewed: 08/24/2018 Elsevier Patient Education  Sheri [name] After These instructions provide you with information about caring for yourself after your procedure. Your health care provider may also give you more specific instructions. Your treatment has been planned according to current medical practices, but problems sometimes occur. Call your health care provider if you have any problems or questions after your procedure. What can I expect after the procedure? After your procedure, you may:  Feel sleepy for several hours.  Feel clumsy and have poor balance for several hours.  Feel forgetful about what happened after the procedure.  Have poor judgment for several hours.  Feel nauseous or vomit.  Have a sore throat if you had a breathing tube during the procedure. Follow these instructions at home: For at least 24 hours after the procedure:      Have a responsible adult stay with you. It is important to have someone help care for you until you are awake and alert.  Rest as needed.  Do not: ? Participate in activities in which you could fall or become injured. ? Drive. ? Use heavy machinery. ? Drink alcohol. ? Take sleeping pills or medicines that cause  drowsiness. ? Make important decisions or sign legal documents. ? Take care of children on your own. Eating and drinking  Follow the diet that is recommended by your health care provider.  If you vomit, drink water, juice, or soup when you can drink without vomiting.  Make sure you have little or no nausea before eating solid foods. General instructions  Take over-the-counter and prescription medicines only as told by your health care provider.  If you have sleep apnea, surgery and certain medicines can increase your risk for breathing problems. Follow instructions from your health care provider about wearing your sleep device: ? Anytime you are sleeping, including during daytime naps. ? While taking prescription pain medicines, sleeping medicines, or medicines that make you drowsy.  If you smoke, do not smoke without supervision.  Keep all follow-up visits as told by your health care provider.  This is important. Contact a health care provider if:  You keep feeling nauseous or you keep vomiting.  You feel light-headed.  You develop a rash.  You have a fever. Get help right away if:  You have trouble breathing. Summary  For several hours after your procedure, you may feel sleepy and have poor judgment.  Have a responsible adult stay with you for at least 24 hours or until you are awake and alert. This information is not intended to replace advice given to you by your health care provider. Make sure you discuss any questions you have with your health care provider. Document Revised: 04/28/2017 Document Reviewed: 05/21/2015 Elsevier Patient Education  [place].

## 2020-01-20 ENCOUNTER — Other Ambulatory Visit: Payer: Self-pay

## 2020-01-20 ENCOUNTER — Encounter (HOSPITAL_COMMUNITY)
Admission: RE | Admit: 2020-01-20 | Discharge: 2020-01-20 | Disposition: A | Discharge status: Home / Self Care | Payer: Medicare Other | Source: Ambulatory Visit | Attending: Internal Medicine | Admitting: Internal Medicine

## 2020-01-20 ENCOUNTER — Other Ambulatory Visit (HOSPITAL_COMMUNITY)
Admission: RE | Admit: 2020-01-20 | Discharge: 2020-01-20 | Disposition: A | Discharge status: Home / Self Care | Payer: Medicare Other | Source: Ambulatory Visit | Attending: Internal Medicine | Admitting: Internal Medicine

## 2020-01-20 DIAGNOSIS — Z01818 Encounter for other preprocedural examination: Secondary | ICD-10-CM | POA: Diagnosis not present

## 2020-01-20 DIAGNOSIS — Z20822 Contact with and (suspected) exposure to covid-19: Secondary | ICD-10-CM | POA: Diagnosis not present

## 2020-01-20 LAB — COMPREHENSIVE METABOLIC PANEL
ALT: 22 U/L (ref 0–44)
AST: 28 U/L (ref 15–41)
Albumin: 3.8 g/dL (ref 3.5–5.0)
Alkaline Phosphatase: 105 U/L (ref 38–126)
Anion gap: 9 (ref 5–15)
BUN: 16 mg/dL (ref 8–23)
CO2: 26 mmol/L (ref 22–32)
Calcium: 9.8 mg/dL (ref 8.9–10.3)
Chloride: 102 mmol/L (ref 98–111)
Creatinine, Ser: 0.64 mg/dL (ref 0.44–1.00)
GFR, Estimated: >60 mL/min (ref >60)
Glucose, Bld: 109 mg/dL — ABNORMAL HIGH (ref 70–99)
Potassium: 4.1 mmol/L (ref 3.5–5.1)
Sodium: 137 mmol/L (ref 135–145)
Total Bilirubin: 2.6 mg/dL — ABNORMAL HIGH (ref 0.3–1.2)
Total Protein: 7.5 g/dL (ref 6.5–8.1)

## 2020-01-20 LAB — CBC WITH DIFFERENTIAL/PLATELET
Abs Immature Granulocytes: 0.02 10*3/uL (ref 0.00–0.07)
Basophils Absolute: 0 10*3/uL (ref 0.0–0.1)
Basophils Relative: 1 %
Eosinophils Absolute: 0.1 10*3/uL (ref 0.0–0.5)
Eosinophils Relative: 1 %
HCT: 38.6 % (ref 36.0–46.0)
Hemoglobin: 13.1 g/dL (ref 12.0–15.0)
Immature Granulocytes: 0 %
Lymphocytes Relative: 30 %
Lymphs Abs: 1.7 10*3/uL (ref 0.7–4.0)
MCH: 34.4 pg — ABNORMAL HIGH (ref 26.0–34.0)
MCHC: 33.9 g/dL (ref 30.0–36.0)
MCV: 101.3 fL — ABNORMAL HIGH (ref 80.0–100.0)
Monocytes Absolute: 0.5 10*3/uL (ref 0.1–1.0)
Monocytes Relative: 10 %
Neutro Abs: 3.3 10*3/uL (ref 1.7–7.7)
Neutrophils Relative %: 58 %
Platelets: 224 10*3/uL (ref 150–400)
RBC: 3.81 MIL/uL — ABNORMAL LOW (ref 3.87–5.11)
RDW: 13.4 % (ref 11.5–15.5)
WBC: 5.7 10*3/uL (ref 4.0–10.5)
nRBC: 0 % (ref 0.0–0.2)

## 2020-01-20 LAB — SARS CORONAVIRUS 2 (TAT 6-24 HRS): SARS Coronavirus 2: NEGATIVE

## 2020-01-21 DIAGNOSIS — G8929 Other chronic pain: Secondary | ICD-10-CM | POA: Diagnosis not present

## 2020-01-21 DIAGNOSIS — Z79899 Other long term (current) drug therapy: Secondary | ICD-10-CM | POA: Diagnosis not present

## 2020-01-21 DIAGNOSIS — M25562 Pain in left knee: Secondary | ICD-10-CM | POA: Diagnosis not present

## 2020-01-21 DIAGNOSIS — M25561 Pain in right knee: Secondary | ICD-10-CM | POA: Diagnosis not present

## 2020-01-21 DIAGNOSIS — M542 Cervicalgia: Secondary | ICD-10-CM | POA: Diagnosis not present

## 2020-01-24 ENCOUNTER — Ambulatory Visit (HOSPITAL_COMMUNITY): Payer: Medicare Other | Admitting: Certified Registered"

## 2020-01-24 ENCOUNTER — Telehealth: Payer: Self-pay | Admitting: *Deleted

## 2020-01-24 ENCOUNTER — Ambulatory Visit (HOSPITAL_COMMUNITY)
Admission: RE | Admit: 2020-01-24 | Discharge: 2020-01-24 | Disposition: A | Discharge status: Home / Self Care | Payer: Medicare Other | Attending: Internal Medicine | Admitting: Internal Medicine

## 2020-01-24 ENCOUNTER — Encounter (HOSPITAL_COMMUNITY)
Admission: RE | Disposition: A | Discharge status: Home / Self Care | Payer: Self-pay | Source: Home / Self Care | Attending: Internal Medicine

## 2020-01-24 ENCOUNTER — Encounter (HOSPITAL_COMMUNITY): Payer: Self-pay | Admitting: Internal Medicine

## 2020-01-24 DIAGNOSIS — K5140 Inflammatory polyps of colon without complications: Secondary | ICD-10-CM | POA: Diagnosis not present

## 2020-01-24 DIAGNOSIS — Z8379 Family history of other diseases of the digestive system: Secondary | ICD-10-CM | POA: Diagnosis not present

## 2020-01-24 DIAGNOSIS — K219 Gastro-esophageal reflux disease without esophagitis: Secondary | ICD-10-CM | POA: Diagnosis not present

## 2020-01-24 DIAGNOSIS — Z800 Family history of malignant neoplasm of digestive organs: Secondary | ICD-10-CM | POA: Insufficient documentation

## 2020-01-24 DIAGNOSIS — K5010 Crohn's disease of large intestine without complications: Secondary | ICD-10-CM | POA: Insufficient documentation

## 2020-01-24 DIAGNOSIS — Z885 Allergy status to narcotic agent status: Secondary | ICD-10-CM | POA: Diagnosis not present

## 2020-01-24 DIAGNOSIS — Z1211 Encounter for screening for malignant neoplasm of colon: Secondary | ICD-10-CM | POA: Diagnosis not present

## 2020-01-24 DIAGNOSIS — K621 Rectal polyp: Secondary | ICD-10-CM | POA: Diagnosis not present

## 2020-01-24 DIAGNOSIS — Z79899 Other long term (current) drug therapy: Secondary | ICD-10-CM | POA: Diagnosis not present

## 2020-01-24 DIAGNOSIS — I10 Essential (primary) hypertension: Secondary | ICD-10-CM | POA: Diagnosis not present

## 2020-01-24 HISTORY — PX: COLONOSCOPY WITH PROPOFOL: SHX5780

## 2020-01-24 HISTORY — PX: POLYPECTOMY: SHX5525

## 2020-01-24 SURGERY — COLONOSCOPY WITH PROPOFOL
Anesthesia: General

## 2020-01-24 MED ORDER — LIDOCAINE HCL (CARDIAC) PF 100 MG/5ML IV SOSY
PREFILLED_SYRINGE | INTRAVENOUS | Status: DC | PRN
  Administered 2020-01-24: 50 mg via INTRAVENOUS

## 2020-01-24 MED ORDER — PROPOFOL 10 MG/ML IV BOLUS
INTRAVENOUS | Status: DC | PRN
  Administered 2020-01-24: 100 mg via INTRAVENOUS
  Administered 2020-01-24: 50 mg via INTRAVENOUS
  Administered 2020-01-24: 30 mg via INTRAVENOUS

## 2020-01-24 MED ORDER — OXYCODONE HCL 5 MG/5ML PO SOLN: 5.0000 mg | Freq: Once | ORAL | Status: AC | PRN

## 2020-01-24 MED ORDER — LACTATED RINGERS IV SOLN: INTRAVENOUS | Status: DC

## 2020-01-24 MED ORDER — CHLORHEXIDINE GLUCONATE CLOTH 2 % EX PADS: 6.0000 | MEDICATED_PAD | Freq: Once | CUTANEOUS | Status: DC

## 2020-01-24 MED ORDER — OXYCODONE HCL 5 MG PO TABS
5.0000 mg | ORAL_TABLET | Freq: Once | ORAL | Status: AC | PRN
Start: 2020-01-24 — End: 2020-01-24
  Administered 2020-01-24: 5 mg via ORAL
  Filled 2020-01-24: qty 1

## 2020-01-24 MED ORDER — LACTATED RINGERS IV SOLN: INTRAVENOUS | Status: DC | PRN

## 2020-01-24 MED ORDER — PROPOFOL 500 MG/50ML IV EMUL
INTRAVENOUS | Status: DC | PRN
  Administered 2020-01-24: 150 ug/kg/min via INTRAVENOUS

## 2020-01-24 NOTE — H&P (Signed)
@LOGO @   Primary Care Physician:  Monico Blitz, MD Primary Gastroenterologist:  Dr. Gala Romney  Pre-Procedure History & Physical: HPI:  Sheri [name] is a 69 y.o. female here for long history of ileocolonic Crohn's disease maintained on Imuran and Pentasa.  Here for surveillance examination.  Clinically, doing well from a GI standpoint.  Past Medical History:  Diagnosis Date  . Anemia   . Chronic back pain   . Chronic neck pain   . Complication of anesthesia   . Crohn disease ([place])   . Fibromyalgia   . GERD (gastroesophageal reflux disease)   . Hypertension   . Low back pain   . Macrocytosis without anemia 11/28/2017  . Osteopenia    Last DEXA 12/2010 was normal, on fosamax  . PONV (postoperative nausea and vomiting)   . Skin cancer    sees Dr. Tarri Glenn in [place]    Past Surgical History:  Procedure Laterality Date  . APPENDECTOMY  1990  . BREAST EXCISIONAL BIOPSY Right   . CATARACT EXTRACTION W/PHACO Left 01/13/2014   Procedure: CATARACT EXTRACTION PHACO AND INTRAOCULAR LENS PLACEMENT LEFT EYE;  Surgeon: Tonny Branch, MD;  Location: [facility];  Service: Ophthalmology;  Laterality: Left;  CDE:6.90  . CATARACT EXTRACTION W/PHACO Right 01/24/2014   Procedure: CATARACT EXTRACTION PHACO AND INTRAOCULAR LENS PLACEMENT (IOC);  Surgeon: Tonny Branch, MD;  Location: [facility];  Service: Ophthalmology;  Laterality: Right;  CDE:5.91  . CESAREAN SECTION  [id number]  . COLONOSCOPY  09/11/2006  . COLONOSCOPY  10/30/2010   RMR: External and internal hemorrhoids, likely source of hematochezia  otherwise normal rectum/ Left-sided diverticula, status post prior right hemicolectomy with a friable, inflamed, stenotic surgical anastomosis with upstream dilation of the neoterminal ileum/ Crohn's noted, status post biopsy  . COLONOSCOPY N/A 06/23/2014   Dr. Korbyn Vanes:friable eroded mucosa at the anastomosis with small bowel, consistent with some stenosis Crohn's disease s/p biopsy. Colonic diverticulosis and external  hemorrhoids. Pathology with benign anastomotic mucosa, no dysplasia.   Marland Kitchen drainage of back abscess  2007  . ESOPHAGOGASTRODUODENOSCOPY  04/2009   noncritical schatzki's ring, small hh, sb bx negative  . ESOPHAGOGASTRODUODENOSCOPY (EGD) WITH PROPOFOL N/A 02/06/2017   Dr. Gala Romney: mild schatzki's ring at GE junction s/p dilation, small hiatal hernia, multiple 3 mm pedunculated and sessile polyps in gastric fundus, normal duodenum  . MALONEY DILATION N/A 02/06/2017   Procedure: Venia Minks DILATION;  Surgeon: Daneil Dolin, MD;  Location: [facility];  Service: Endoscopy;  Laterality: N/A;  . POLYPECTOMY  02/06/2017   Procedure: POLYPECTOMY;  Surgeon: Daneil Dolin, MD;  Location: [facility];  Service: Endoscopy;;  gastric  . SMALL INTESTINE SURGERY  [phone]    Prior to Admission medications   Medication Sig Start Date End Date Taking? Authorizing Provider  acetaminophen (TYLENOL) 500 MG tablet Take 500 mg by mouth every 6 (six) hours as needed for moderate pain or headache.    Yes [provider]  Ascorbic Acid (VITAMIN C) 1000 MG tablet Take 1,000 mg by mouth daily.   Yes [provider]  azaTHIOprine (IMURAN) 50 MG tablet TAKE 2 & 1/2 TABLETS DAILY. Patient taking differently: Take 125 mg by mouth daily. 05/20/19  Yes Annitta Needs, NP  b complex vitamins capsule Take 1 capsule by mouth daily.   Yes [provider]  Biotin 1000 MCG tablet Take 1 capsule by mouth in the morning, at noon, and at bedtime.    Yes [provider]  Calcium Carbonate-Vitamin D (CALCIUM  600+D PO) Take 1,200 mg by mouth 3 (three) times daily.   Yes [provider]  Coenzyme Q10 (CO Q 10) 100 MG CAPS Take 200 mg by mouth daily.   Yes [provider]  Cranberry 500 MG CAPS Take 500 mg by mouth daily.   Yes [provider]  cyanocobalamin (,VITAMIN B-12,) 1000 MCG/ML injection Inject 1,000 mcg into the muscle every 14 (fourteen) days.   Yes [provider]  cyclobenzaprine (FLEXERIL) 10 MG tablet Take 10 mg by mouth 3 (three) times daily.    Yes [provider]  dexlansoprazole (DEXILANT) 60 MG capsule Take 1 capsule (60 mg total) by mouth daily. Patient taking differently: Take 60 mg by mouth daily. 04/27/19  Yes Annitta Needs, NP  DULoxetine (CYMBALTA) 60 MG capsule Take 1 capsule (60 mg total) by mouth daily. 08/25/19  Yes Cloria Spring, MD  ergocalciferol (VITAMIN D2) 50000 units capsule Take 50,000 Units by mouth every Sunday.   Yes [provider]  gabapentin (NEURONTIN) 400 MG capsule Take 1 tab in AM, 3 tabs at bedtime Patient taking differently: Take 400 mg by mouth See admin instructions. Take 400 mg in AM, 1200 mg at bedtime 10/05/19  Yes Cameron Sprang, MD  hydrocortisone 2.5 % cream Apply 1 application topically daily as needed (Itching).  02/25/18  Yes [provider]  ketoconazole (NIZORAL) 2 % cream Apply 1 application topically daily as needed for irritation.   Yes [provider]  lisinopril-hydrochlorothiazide (PRINZIDE,ZESTORETIC) 20-12.5 MG per tablet Take 1 tablet by mouth daily. 06/19/10  Yes Mahala Menghini, PA-C  loratadine (CLARITIN) 10 MG tablet Take 10 mg by mouth daily.   Yes [provider]  magnesium oxide (MAG-OX) 400 MG tablet Take 400 mg by mouth daily.   Yes [provider]  mesalamine (PENTASA) 500 MG CR capsule Take 2 capsules (1,000 mg total) by mouth 4 (four) times daily. TAKE (2) CAPSULES FOUR TIMES DAILY. Patient taking differently: Take 1,000 mg by mouth 4 (four) times daily. 09/10/19 01/19/20 Yes Annitta Needs, NP  Omega-3 Fatty Acids (FISH OIL) 1200 MG CAPS Take 1,200 mg by mouth in the morning, at noon, and at bedtime.    Yes [provider]  oxyCODONE (ROXICODONE) 15 MG immediate release tablet Take 15 mg by mouth every 6 (six) hours. 11/12/10  Yes [provider]  OXYCONTIN 10 MG 12 hr tablet Take 10 mg by mouth every 12  (twelve) hours.  11/05/18  Yes [provider]  Potassium 99 MG TABS Take 99 mg by mouth daily.   Yes [provider]  Probiotic Product (PROBIOTIC DAILY) CAPS Take 1 tablet by mouth daily.   Yes [provider]  promethazine (PHENERGAN) 12.5 MG tablet Take 12.5 mg by mouth daily as needed for nausea/vomiting. 12/27/19  Yes [provider]  TURMERIC PO Take 1,000 mg by mouth daily.   Yes [provider]  VITAMIN E PO Take 180 mg by mouth in the morning, at noon, and at bedtime.   Yes [provider]  zinc gluconate 50 MG tablet Take 50 mg by mouth daily.   Yes [provider]  busPIRone (BUSPAR) 10 MG tablet Take 1 tablet (10 mg total) by mouth 2 (two) times daily. 07/22/19   [name], MD  donepezil (ARICEPT) 10 MG tablet TAKE 1 TABLET DAILY. 10/05/19   Cameron Sprang, MD  Misc. Devices (WRIST BRACE) MISC Use as support for wrist 11/17/19  Cameron Sprang, MD  temazepam (RESTORIL) 15 MG capsule Take 2 capsules (30 mg total) by mouth at bedtime. 04/06/19   [name], MD    Allergies as of 11/04/2019 - Review Complete 11/02/2019  Allergen Reaction Noted  . Codeine sulfate Nausea Only     Family History  Problem Relation Age of Onset  . Crohn's disease Mother        succumbed to stomach cancer in her 34s  . Stomach cancer Mother   . Heart attack Father   . COPD Father   . Colon cancer Neg Hx     Social History   Socioeconomic History  . Marital status: Legally Separated    Spouse name: Not on file  . Number of children: 2  . Years of education: 63  . Highest education level: 12th grade  Occupational History  . Occupation: disability    Employer: NOT EMPLOYED  Tobacco Use  . Smoking status: Never Smoker  . Smokeless tobacco: Never Used  Vaping Use  . Vaping Use: Never used  Substance and Sexual Activity  . Alcohol use: No  . Drug use: No  . Sexual activity: Yes    Birth control/protection: Post-menopausal   Other Topics Concern  . Not on file  Social History Narrative   Highest level of edu- 12th      Right handed      Lives in one story home. Son lives with her currently.   Social Determinants of Health   Financial Resource Strain: Not on file  Food Insecurity: Not on file  Transportation Needs: Not on file  Physical Activity: Not on file  Stress: Not on file  Social Connections: Not on file  Intimate Partner Violence: Not on file    Review of Systems: See HPI, otherwise negative ROS  Physical Exam: BP 140/67   Pulse 93   Temp 99 F (37.2 C) (Oral)   Resp 18   SpO2 97%  General:   Alert,  Well-developed, well-nourished, pleasant and cooperative in NAD eck:  Supple; no masses or thyromegaly. No significant cervical adenopathy. Lungs:  Clear throughout to auscultation.   No wheezes, crackles, or rhonchi. No acute distress. Heart:  Regular rate and rhythm; no murmurs, clicks, rubs,  or gallops. Abdomen: Non-distended, normal bowel sounds.  Soft and nontender without appreciable mass or hepatosplenomegaly.  Pulses:  Normal pulses noted. Extremities:  Without clubbing or edema.  Impression/Plan: 69 year old lady with longstanding ileocolonic Crohn's disease.  Here for surveillance examination per plan.  Clinically, Crohn's doing well. The risks, benefits, limitations, alternatives and imponderables have been reviewed with the patient. Questions have been answered. All parties are agreeable.      Notice: This dictation was prepared with Dragon dictation along with smaller phrase technology. Any transcriptional errors that result from this process are unintentional and may not be corrected upon review.

## 2020-01-24 NOTE — Anesthesia Postprocedure Evaluation (Signed)
Anesthesia Post Note  Patient: Sheri [name]  Procedure(s) Performed: COLONOSCOPY WITH PROPOFOL (N/A ) POLYPECTOMY  Patient location during evaluation: PACU Anesthesia Type: General Level of consciousness: awake and alert and oriented Pain management: pain level controlled Vital Signs Assessment: post-procedure vital signs reviewed and stable Respiratory status: spontaneous breathing, nonlabored ventilation and respiratory function stable Cardiovascular status: blood pressure returned to baseline and stable Postop Assessment: adequate PO intake and no apparent nausea or vomiting Anesthetic complications: no   No complications documented.   Last Vitals:  Vitals:   01/24/20 1045 01/24/20 1100  BP: (!) 86/69 127/63  Pulse: 89 87  Resp: (!) 25 (!) 26  Temp:    SpO2: 99% 100%    Last Pain:  Vitals:   01/24/20 1035  TempSrc:   PainSc: [place]

## 2020-01-24 NOTE — Transfer of Care (Signed)
Immediate Anesthesia Transfer of Care Note  Patient: Sheri [name]  Procedure(s) Performed: COLONOSCOPY WITH PROPOFOL (N/A ) POLYPECTOMY  Patient Location: PACU  Anesthesia Type:General  Level of Consciousness: awake, alert  and oriented  Airway & Oxygen Therapy: Patient Spontanous Breathing  Post-op Assessment: Report given to RN, Post -op Vital signs reviewed and stable and Patient moving all extremities X 4  Post vital signs: Reviewed and stable  Last Vitals:  Vitals Value Taken Time  BP 131/68 01/24/20 1034  Temp    Pulse 87 01/24/20 1035  Resp 18 01/24/20 1035  SpO2 99 % 01/24/20 1035  Vitals shown include unvalidated device data.  Last Pain:  Vitals:   01/24/20 0901  TempSrc: Oral  PainSc: 7       Patients Stated Pain Goal: 5 (29/03/79 5583)  Complications: No complications documented.

## 2020-01-24 NOTE — Anesthesia Preprocedure Evaluation (Signed)
Anesthesia Evaluation  Patient identified by MRN, date of birth, ID band Patient awake    Reviewed: Allergy & Precautions, H&P , NPO status , Patient's Chart, lab work & pertinent test results, reviewed documented beta blocker date and time   History of Anesthesia Complications (+) PONV and history of anesthetic complications  Airway Mallampati: II  TM Distance: >3 FB Neck ROM: full    Dental no notable dental hx.    Pulmonary neg pulmonary ROS,    Pulmonary exam normal breath sounds clear to auscultation       Cardiovascular Exercise Tolerance: Good hypertension, negative cardio ROS   Rhythm:regular Rate:Normal     Neuro/Psych  Headaches, PSYCHIATRIC DISORDERS Anxiety Depression  Neuromuscular disease    GI/Hepatic Neg liver ROS, GERD  ,  Endo/Other  negative endocrine ROS  Renal/GU negative Renal ROS  negative genitourinary   Musculoskeletal   Abdominal   Peds  Hematology  (+) Blood dyscrasia, anemia ,   Anesthesia Other Findings   Reproductive/Obstetrics negative OB ROS                             Anesthesia Physical Anesthesia Plan  ASA: III  Anesthesia Plan: General   Post-op Pain Management:    Induction:   PONV Risk Score and Plan: Propofol infusion  Airway Management Planned:   Additional Equipment:   Intra-op Plan:   Post-operative Plan:   Informed Consent: I have reviewed the patients History and Physical, chart, labs and discussed the procedure including the risks, benefits and alternatives for the proposed anesthesia with the patient or authorized representative who has indicated his/her understanding and acceptance.     Dental Advisory Given  Plan Discussed with: CRNA  Anesthesia Plan Comments:         Anesthesia Quick Evaluation

## 2020-01-24 NOTE — Discharge Instructions (Signed)
Colonoscopy Discharge Instructions  Read the instructions outlined below and refer to this sheet in the next few weeks. These discharge instructions provide you with general information on caring for yourself after you leave the hospital. Your doctor may also give you specific instructions. While your treatment has been planned according to the most current medical practices available, unavoidable complications occasionally occur. If you have any problems or questions after discharge, call Dr. Gala Romney at [phone]. ACTIVITY  You may resume your regular activity, but move at a slower pace for the next 24 hours.   Take frequent rest periods for the next 24 hours.   Walking will help get rid of the air and reduce the bloated feeling in your belly (abdomen).   No driving for 24 hours (because of the medicine (anesthesia) used during the test).    Do not sign any important legal documents or operate any machinery for 24 hours (because of the anesthesia used during the test).  NUTRITION  Drink plenty of fluids.   You may resume your normal diet as instructed by your doctor.   Begin with a light meal and progress to your normal diet. Heavy or fried foods are harder to digest and may make you feel sick to your stomach (nauseated).   Avoid alcoholic beverages for 24 hours or as instructed.  MEDICATIONS  You may resume your normal medications unless your doctor tells you otherwise.  WHAT YOU CAN EXPECT TODAY  Some feelings of bloating in the abdomen.   Passage of more gas than usual.   Spotting of blood in your stool or on the toilet paper.  IF YOU HAD POLYPS REMOVED DURING THE COLONOSCOPY:  No aspirin products for 7 days or as instructed.   No alcohol for 7 days or as instructed.   Eat a soft diet for the next 24 hours.  FINDING OUT THE RESULTS OF YOUR TEST Not all test results are available during your visit. If your test results are not back during the visit, make an appointment  with your caregiver to find out the results. Do not assume everything is normal if you have not heard from your caregiver or the medical facility. It is important for you to follow up on all of your test results.  SEEK IMMEDIATE MEDICAL ATTENTION IF:  You have more than a spotting of blood in your stool.   Your belly is swollen (abdominal distention).   You are nauseated or vomiting.   You have a temperature over 101.   You have abdominal pain or discomfort that is severe or gets worse throughout the day.   Crohn's disease appears stable.  1 polyp removed from your rectum  You may pass a little blood with your next bowel movement but it will go away  Further recommendations to follow pending review of pathology report  Office appointment with [place] in 6 months  At patient request, I called Marda Stalker at [phone] and reviewed results      Colon Polyps  Polyps are tissue growths inside the body. Polyps can grow in many places, including the large intestine (colon). A polyp may be a round bump or a mushroom-shaped growth. You could have one polyp or several. Most colon polyps are noncancerous (benign). However, some colon polyps can become cancerous over time. Finding and removing the polyps early can help prevent this. What are the causes? The exact cause of colon polyps is not known. What increases the risk? You are more likely to develop  this condition if you:  Have a family history of colon cancer or colon polyps.  Are older than 42 or older than 45 if you are African American.  Have inflammatory bowel disease, such as ulcerative colitis or Crohn's disease.  Have certain hereditary conditions, such as: ? Familial adenomatous polyposis. ? Lynch syndrome. ? Turcot syndrome. ? Peutz-Jeghers syndrome.  Are overweight.  Smoke cigarettes.  Do not get enough exercise.  Drink too much alcohol.  Eat a diet that is high in fat and red meat and low in fiber.  Had  childhood cancer that was treated with abdominal radiation. What are the signs or symptoms? Most polyps do not cause symptoms. If you have symptoms, they may include:  Blood coming from your rectum when having a bowel movement.  Blood in your stool. The stool may look dark red or black.  Abdominal pain.  A change in bowel habits, such as constipation or diarrhea. How is this diagnosed? This condition is diagnosed with a colonoscopy. This is a procedure in which a lighted, flexible scope is inserted into the anus and then passed into the colon to examine the area. Polyps are sometimes found when a colonoscopy is done as part of routine cancer screening tests. How is this treated? Treatment for this condition involves removing any polyps that are found. Most polyps can be removed during a colonoscopy. Those polyps will then be tested for cancer. Additional treatment may be needed depending on the results of testing. Follow these instructions at home: Lifestyle  Maintain a healthy weight, or lose weight if recommended by your health care provider.  Exercise every day or as told by your health care provider.  Do not use any products that contain nicotine or tobacco, such as cigarettes and e-cigarettes. If you need help quitting, ask your health care provider.  If you drink alcohol, limit how much you have: ? 0-1 drink a day for women. ? 0-2 drinks a day for men.  Be aware of how much alcohol is in your drink. In the [place] one drink equals one 12 oz bottle of beer (355 mL), one 5 oz glass of wine (148 mL), or one 1 oz shot of hard liquor (44 mL). Eating and drinking   Eat foods that are high in fiber, such as fruits, vegetables, and whole grains.  Eat foods that are high in calcium and vitamin D, such as milk, cheese, yogurt, eggs, liver, fish, and broccoli.  Limit foods that are high in fat, such as fried foods and desserts.  Limit the amount of red meat and processed meat you  eat, such as hot dogs, sausage, bacon, and lunch meats. General instructions  Keep all follow-up visits as told by your health care provider. This is important. ? This includes having regularly scheduled colonoscopies. ? Talk to your health care provider about when you need a colonoscopy. Contact a health care provider if:  You have new or worsening bleeding during a bowel movement.  You have new or increased blood in your stool.  You have a change in bowel habits.  You lose weight for no known reason. Summary  Polyps are tissue growths inside the body. Polyps can grow in many places, including the colon.  Most colon polyps are noncancerous (benign), but some can become cancerous over time.  This condition is diagnosed with a colonoscopy.  Treatment for this condition involves removing any polyps that are found. Most polyps can be removed during a colonoscopy. This information  is not intended to replace advice given to you by your health care provider. Make sure you discuss any questions you have with your health care provider. Document Revised: 05/15/2017 Document Reviewed: 05/15/2017 Elsevier Patient Education  [place] After These instructions provide you with information about caring for yourself after your procedure. Your health care provider may also give you more specific instructions. Your treatment has been planned according to current medical practices, but problems sometimes occur. Call your health care provider if you have any problems or questions after your procedure. What can I expect after the procedure? After your procedure, you may:  Feel sleepy for several hours.  Feel clumsy and have poor balance for several hours.  Feel forgetful about what happened after the procedure.  Have poor judgment for several hours.  Feel nauseous or vomit.  Have a sore throat if you had a breathing tube during the  procedure. Follow these instructions at home: For at least 24 hours after the procedure:      Have a responsible adult stay with you. It is important to have someone help care for you until you are awake and alert.  Rest as needed.  Do not: ? Participate in activities in which you could fall or become injured. ? Drive. ? Use heavy machinery. ? Drink alcohol. ? Take sleeping pills or medicines that cause drowsiness. ? Make important decisions or sign legal documents. ? Take care of children on your own. Eating and drinking  Follow the diet that is recommended by your health care provider.  If you vomit, drink water, juice, or soup when you can drink without vomiting.  Make sure you have little or no nausea before eating solid foods. General instructions  Take over-the-counter and prescription medicines only as told by your health care provider.  If you have sleep apnea, surgery and certain medicines can increase your risk for breathing problems. Follow instructions from your health care provider about wearing your sleep device: ? Anytime you are sleeping, including during daytime naps. ? While taking prescription pain medicines, sleeping medicines, or medicines that make you drowsy.  If you smoke, do not smoke without supervision.  Keep all follow-up visits as told by your health care provider. This is important. Contact a health care provider if:  You keep feeling nauseous or you keep vomiting.  You feel light-headed.  You develop a rash.  You have a fever. Get help right away if:  You have trouble breathing. Summary  For several hours after your procedure, you may feel sleepy and have poor judgment.  Have a responsible adult stay with you for at least 24 hours or until you are awake and alert. This information is not intended to replace advice given to you by your health care provider. Make sure you discuss any questions you have with your health care  provider. Document Revised: 04/28/2017 Document Reviewed: 05/21/2015 Elsevier Patient Education  [place].

## 2020-01-24 NOTE — Anesthesia Procedure Notes (Signed)
Date/Time: 01/24/2020 10:10 AM Performed by: Orlie Dakin, CRNA Pre-anesthesia Checklist: Patient identified, Emergency Drugs available, Suction available and Patient being monitored Patient Re-evaluated:Patient Re-evaluated prior to induction Oxygen Delivery Method: Nasal cannula Induction Type: IV induction Placement Confirmation: positive ETCO2

## 2020-01-24 NOTE — Op Note (Signed)
[facility] Patient Name: Sheri [name] Procedure Date: 01/24/2020 9:31 AM MRN: [id number] Date of Birth: 1950-05-04 Attending MD: Norvel Richards , MD CSN: [id number] Age: 69 Admit Type: Outpatient Procedure:                Colonoscopy Indications:              High risk colon cancer surveillance: Crohn's                            colitis of 8 (or more) years duration (Ileo-colonic) Providers:                Norvel Richards, MD, Lurline Del, RN, Aram Candela Referring MD:              Medicines:                Propofol per Anesthesia Complications:            No immediate complications. Estimated Blood Loss:     Estimated blood loss was minimal. Procedure:                Pre-Anesthesia Assessment:                           - Prior to the procedure, a History and Physical                            was performed, and patient medications and                            allergies were reviewed. The patient's tolerance of                            previous anesthesia was also reviewed. The risks                            and benefits of the procedure and the sedation                            options and risks were discussed with the patient.                            All questions were answered, and informed consent                            was obtained. Prior Anticoagulants: The patient has                            taken no previous anticoagulant or antiplatelet                            agents. ASA Grade Assessment: III - A patient with  severe systemic disease. After reviewing the risks                            and benefits, the patient was deemed in                            satisfactory condition to undergo the procedure.                           After obtaining informed consent, the colonoscope                            was passed under direct vision. Throughout the                             procedure, the patient's blood pressure, pulse, and                            oxygen saturations were monitored continuously. The                            CF-HQ190L ([id number]) scope was introduced through                            the anus and advanced to the the ileocolonic                            anastomosis. The colonoscopy was performed without                            difficulty. The patient tolerated the procedure                            well. The quality of the bowel preparation was                            adequate. Scope In: 10:09:10 AM Scope Out: 10:29:37 AM Scope Withdrawal Time: 0 hours 13 minutes 36 seconds  Total Procedure Duration: 0 hours 20 minutes 27 seconds  Findings:      The perianal and digital rectal examinations were normal. Prep initially       suboptimal. With copious washing and lavage I gained adequate use of the       colonic mucosa.      A 8 mm polyp was found in the distal rectum. The polyp was       semi-pedunculated. The polyp was removed with a cold snare. Resection       and retrieval were complete. Estimated blood loss was minimal. Status       post right hemicolectomy anastomosis with small bowel readily       identified. Friable anastomosis with narrowed lumen. I could not       intubate the terminal ileum.      The exam was otherwise without abnormality on direct and retroflexion       views. Impression:               -  One 8 mm polyp in the distal rectum, removed with                            a cold snare. Resected and retrieved.                           - The examination was otherwise normal on direct                            and retroflexion views. Abnormal ileocolonic                            anastomosis consistent with Crohn's disease Moderate Sedation:      Moderate (conscious) sedation was personally administered by an       anesthesia professional. The following parameters were monitored: oxygen       saturation, heart  rate, blood pressure, respiratory rate, EKG, adequacy       of pulmonary ventilation, and response to care. Recommendation:           - Patient has a contact number available for                            emergencies. The signs and symptoms of potential                            delayed complications were discussed with the                            patient. Return to normal activities tomorrow.                            Written discharge instructions were provided to the                            patient.                           - Resume previous diet.                           - Continue present medications.                           - Repeat colonoscopy date to be determined after                            pending pathology results are reviewed for                            surveillance.                           - Return to GI office in 6 months. Procedure Code(s):        --- Professional ---  45385, Colonoscopy, flexible; with removal of                            tumor(s), polyp(s), or other lesion(s) by snare                            technique Diagnosis Code(s):        --- Professional ---                           K50.10, Crohn's disease of large intestine without                            complications                           K62.1, Rectal polyp CPT copyright 2019 [employer]. All rights reserved. The codes documented in this report are preliminary and upon coder review may  be revised to meet current compliance requirements. Cristopher Estimable. Jameia Makris, MD Norvel Richards, MD 01/24/2020 10:39:10 AM This report has been signed electronically. Number of Addenda: 0

## 2020-01-24 NOTE — Telephone Encounter (Signed)
Per Dr. Gala Romney: will need a better colonoscopy prep next time

## 2020-01-25 LAB — SURGICAL PATHOLOGY

## 2020-01-26 ENCOUNTER — Encounter: Payer: Self-pay | Admitting: Internal Medicine

## 2020-01-28 ENCOUNTER — Encounter (HOSPITAL_COMMUNITY): Payer: Self-pay | Admitting: Internal Medicine

## 2020-02-11 DIAGNOSIS — I10 Essential (primary) hypertension: Secondary | ICD-10-CM | POA: Diagnosis not present

## 2020-02-18 DIAGNOSIS — G8929 Other chronic pain: Secondary | ICD-10-CM | POA: Diagnosis not present

## 2020-02-18 DIAGNOSIS — M25562 Pain in left knee: Secondary | ICD-10-CM | POA: Diagnosis not present

## 2020-02-18 DIAGNOSIS — Z79899 Other long term (current) drug therapy: Secondary | ICD-10-CM | POA: Diagnosis not present

## 2020-02-18 DIAGNOSIS — M25561 Pain in right knee: Secondary | ICD-10-CM | POA: Diagnosis not present

## 2020-02-18 DIAGNOSIS — M5442 Lumbago with sciatica, left side: Secondary | ICD-10-CM | POA: Diagnosis not present

## 2020-02-22 ENCOUNTER — Other Ambulatory Visit: Payer: Self-pay | Admitting: Neurology

## 2020-02-22 ENCOUNTER — Other Ambulatory Visit: Payer: Self-pay | Admitting: Gastroenterology

## 2020-02-22 DIAGNOSIS — K219 Gastro-esophageal reflux disease without esophagitis: Secondary | ICD-10-CM

## 2020-02-23 ENCOUNTER — Telehealth: Payer: Self-pay | Admitting: Internal Medicine

## 2020-02-23 DIAGNOSIS — U071 COVID-19: Secondary | ICD-10-CM | POA: Diagnosis not present

## 2020-02-23 DIAGNOSIS — M791 Myalgia, unspecified site: Secondary | ICD-10-CM | POA: Diagnosis not present

## 2020-02-23 DIAGNOSIS — Z20822 Contact with and (suspected) exposure to covid-19: Secondary | ICD-10-CM | POA: Diagnosis not present

## 2020-02-23 NOTE — Telephone Encounter (Signed)
Pt wanted to speak to Roseanne Kaufman, NP or a nurse regarding having diarrhea and covid. [phone]

## 2020-02-23 NOTE — Telephone Encounter (Signed)
Ok to continue imodium on an as needed basis.

## 2020-02-23 NOTE — Telephone Encounter (Signed)
Spoke to pt and she said that son tested positive for Covid last Sat.  He lives with her.  She said that she has been feeling bad.  A lot of diarrhea x 4 days.  Took 2 imodium last night but diarrhea has started back again today.  She is also having headaches, nasal congestion, runny nose, muscle aches, can smell but no taste.  This has been going on 4 days as well.  Pt advised to call PCP or pharmacy to schedule Covid test.  She would like to know if ok to continue imodium in the meantime.

## 2020-02-24 NOTE — Telephone Encounter (Signed)
Communication noted.  Thank you.

## 2020-02-24 NOTE — Telephone Encounter (Signed)
Called pt and made her aware to continue Imodium on an as needed basis.  She went yesterday and had a Covid test.  She wanted to inform us that it was positive.  Routing to Dr. Gala Romney as Juluis Rainier.

## 2020-03-02 ENCOUNTER — Telehealth: Payer: Self-pay

## 2020-03-02 ENCOUNTER — Other Ambulatory Visit: Payer: Self-pay

## 2020-03-02 DIAGNOSIS — K7460 Unspecified cirrhosis of liver: Secondary | ICD-10-CM

## 2020-03-02 NOTE — Telephone Encounter (Signed)
LAB ORDERS MAILED TO THE PT

## 2020-03-02 NOTE — Telephone Encounter (Signed)
LABS MAILED TO PT

## 2020-03-13 DIAGNOSIS — I10 Essential (primary) hypertension: Secondary | ICD-10-CM | POA: Diagnosis not present

## 2020-03-17 DIAGNOSIS — M25562 Pain in left knee: Secondary | ICD-10-CM | POA: Diagnosis not present

## 2020-03-17 DIAGNOSIS — Z79899 Other long term (current) drug therapy: Secondary | ICD-10-CM | POA: Diagnosis not present

## 2020-03-17 DIAGNOSIS — M542 Cervicalgia: Secondary | ICD-10-CM | POA: Diagnosis not present

## 2020-03-17 DIAGNOSIS — G8929 Other chronic pain: Secondary | ICD-10-CM | POA: Diagnosis not present

## 2020-03-17 DIAGNOSIS — M25561 Pain in right knee: Secondary | ICD-10-CM | POA: Diagnosis not present

## 2020-03-23 ENCOUNTER — Other Ambulatory Visit: Payer: Self-pay | Admitting: Gastroenterology

## 2020-03-23 DIAGNOSIS — K7460 Unspecified cirrhosis of liver: Secondary | ICD-10-CM | POA: Diagnosis not present

## 2020-03-24 LAB — HEPATIC FUNCTION PANEL
AG Ratio: 1.3 (calc) (ref 1.0–2.5)
ALT: 17 U/L (ref 6–29)
AST: 20 U/L (ref 10–35)
Albumin: 3.9 g/dL (ref 3.6–5.1)
Alkaline phosphatase (APISO): 130 U/L (ref 37–153)
Bilirubin, Direct: 0.8 mg/dL — ABNORMAL HIGH (ref 0.0–0.2)
Globulin: 3.1 g/dL (calc) (ref 1.9–3.7)
Indirect Bilirubin: 1.1 mg/dL (calc) (ref 0.2–1.2)
Total Bilirubin: 1.9 mg/dL — ABNORMAL HIGH (ref 0.2–1.2)
Total Protein: 7 g/dL (ref 6.1–8.1)

## 2020-04-10 ENCOUNTER — Other Ambulatory Visit: Payer: Self-pay | Admitting: Gastroenterology

## 2020-04-11 DIAGNOSIS — R5383 Other fatigue: Secondary | ICD-10-CM | POA: Diagnosis not present

## 2020-04-11 DIAGNOSIS — Z0000 Encounter for general adult medical examination without abnormal findings: Secondary | ICD-10-CM | POA: Diagnosis not present

## 2020-04-11 DIAGNOSIS — Z7189 Other specified counseling: Secondary | ICD-10-CM | POA: Diagnosis not present

## 2020-04-11 DIAGNOSIS — Z299 Encounter for prophylactic measures, unspecified: Secondary | ICD-10-CM | POA: Diagnosis not present

## 2020-04-11 DIAGNOSIS — E559 Vitamin D deficiency, unspecified: Secondary | ICD-10-CM | POA: Diagnosis not present

## 2020-04-11 DIAGNOSIS — E7800 Pure hypercholesterolemia, unspecified: Secondary | ICD-10-CM | POA: Diagnosis not present

## 2020-04-11 DIAGNOSIS — E538 Deficiency of other specified B group vitamins: Secondary | ICD-10-CM | POA: Diagnosis not present

## 2020-04-11 DIAGNOSIS — I10 Essential (primary) hypertension: Secondary | ICD-10-CM | POA: Diagnosis not present

## 2020-04-11 DIAGNOSIS — Z79899 Other long term (current) drug therapy: Secondary | ICD-10-CM | POA: Diagnosis not present

## 2020-04-15 DIAGNOSIS — G8929 Other chronic pain: Secondary | ICD-10-CM | POA: Diagnosis not present

## 2020-04-15 DIAGNOSIS — M542 Cervicalgia: Secondary | ICD-10-CM | POA: Diagnosis not present

## 2020-04-15 DIAGNOSIS — M25562 Pain in left knee: Secondary | ICD-10-CM | POA: Diagnosis not present

## 2020-04-15 DIAGNOSIS — M25561 Pain in right knee: Secondary | ICD-10-CM | POA: Diagnosis not present

## 2020-04-15 DIAGNOSIS — Z79899 Other long term (current) drug therapy: Secondary | ICD-10-CM | POA: Diagnosis not present

## 2020-04-19 DIAGNOSIS — C44729 Squamous cell carcinoma of skin of left lower limb, including hip: Secondary | ICD-10-CM | POA: Diagnosis not present

## 2020-04-19 DIAGNOSIS — D485 Neoplasm of uncertain behavior of skin: Secondary | ICD-10-CM | POA: Diagnosis not present

## 2020-04-19 DIAGNOSIS — B078 Other viral warts: Secondary | ICD-10-CM | POA: Diagnosis not present

## 2020-04-19 DIAGNOSIS — C44722 Squamous cell carcinoma of skin of right lower limb, including hip: Secondary | ICD-10-CM | POA: Diagnosis not present

## 2020-04-19 DIAGNOSIS — L570 Actinic keratosis: Secondary | ICD-10-CM | POA: Diagnosis not present

## 2020-04-20 ENCOUNTER — Telehealth: Payer: Self-pay | Admitting: Internal Medicine

## 2020-04-20 NOTE — Telephone Encounter (Signed)
[phone] Patient asked to speak to you, she is concerned about a medication she is on.  She has an appointment with you in June.  She is taking Imuran and her dermatologist is concerned about it causing her more skin cancer lesions.  She wanted to double check with you to see if she should take it with those risks.

## 2020-04-24 NOTE — Telephone Encounter (Signed)
Patient notes that she sees Dr. Tarri Glenn and has been having skin cancers appear, larger and deeper. Dermatologist concerned about continued Imuran use.   Will discuss with Dr. Gala Romney.

## 2020-05-02 ENCOUNTER — Other Ambulatory Visit: Payer: Self-pay | Admitting: *Deleted

## 2020-05-02 DIAGNOSIS — K5080 Crohn's disease of both small and large intestine without complications: Secondary | ICD-10-CM

## 2020-05-02 NOTE — Telephone Encounter (Signed)
Called [facility] and 1st available appt 6/15 at 8:00am, arrival 7:45am with Dr. Roney Mans.  Called pt and she is aware of appt. Pt also provided with # to check for cancellations.

## 2020-05-02 NOTE — Telephone Encounter (Signed)
[facility] clinical pool:  I reviewed case with Dr. Gala Romney. Please refer patient to [facility] IBD clinic. She has been on Imuran long-standing for history of Crohn's, now with frequent skin cancers. We need guidance on best management for her as this is an interesting scenario. Patient aware we are referring.

## 2020-05-04 ENCOUNTER — Other Ambulatory Visit (HOSPITAL_COMMUNITY): Payer: Self-pay

## 2020-05-04 DIAGNOSIS — D500 Iron deficiency anemia secondary to blood loss (chronic): Secondary | ICD-10-CM

## 2020-05-04 DIAGNOSIS — E538 Deficiency of other specified B group vitamins: Secondary | ICD-10-CM

## 2020-05-04 DIAGNOSIS — D696 Thrombocytopenia, unspecified: Secondary | ICD-10-CM

## 2020-05-05 ENCOUNTER — Inpatient Hospital Stay (HOSPITAL_COMMUNITY): Payer: Medicare Other | Attending: Hematology

## 2020-05-05 ENCOUNTER — Other Ambulatory Visit: Payer: Self-pay

## 2020-05-05 DIAGNOSIS — D696 Thrombocytopenia, unspecified: Secondary | ICD-10-CM | POA: Diagnosis not present

## 2020-05-05 DIAGNOSIS — D500 Iron deficiency anemia secondary to blood loss (chronic): Secondary | ICD-10-CM | POA: Insufficient documentation

## 2020-05-05 DIAGNOSIS — E538 Deficiency of other specified B group vitamins: Secondary | ICD-10-CM | POA: Insufficient documentation

## 2020-05-05 LAB — LACTATE DEHYDROGENASE: LDH: 134 U/L (ref 98–192)

## 2020-05-05 LAB — COMPREHENSIVE METABOLIC PANEL
ALT: 19 U/L (ref 0–44)
AST: 25 U/L (ref 15–41)
Albumin: 3.8 g/dL (ref 3.5–5.0)
Alkaline Phosphatase: 128 U/L — ABNORMAL HIGH (ref 38–126)
Anion gap: 11 (ref 5–15)
BUN: 16 mg/dL (ref 8–23)
CO2: 26 mmol/L (ref 22–32)
Calcium: 10.2 mg/dL (ref 8.9–10.3)
Chloride: 100 mmol/L (ref 98–111)
Creatinine, Ser: 0.64 mg/dL (ref 0.44–1.00)
GFR, Estimated: >60 mL/min (ref >60)
Glucose, Bld: 111 mg/dL — ABNORMAL HIGH (ref 70–99)
Potassium: 4.1 mmol/L (ref 3.5–5.1)
Sodium: 137 mmol/L (ref 135–145)
Total Bilirubin: 1.7 mg/dL — ABNORMAL HIGH (ref 0.3–1.2)
Total Protein: 7.9 g/dL (ref 6.5–8.1)

## 2020-05-05 LAB — CBC WITH DIFFERENTIAL/PLATELET
Abs Immature Granulocytes: 0.02 10*3/uL (ref 0.00–0.07)
Basophils Absolute: 0 10*3/uL (ref 0.0–0.1)
Basophils Relative: 1 %
Eosinophils Absolute: 0.1 10*3/uL (ref 0.0–0.5)
Eosinophils Relative: 3 %
HCT: 40.6 % (ref 36.0–46.0)
Hemoglobin: 13.8 g/dL (ref 12.0–15.0)
Immature Granulocytes: 0 %
Lymphocytes Relative: 32 %
Lymphs Abs: 1.8 10*3/uL (ref 0.7–4.0)
MCH: 34.4 pg — ABNORMAL HIGH (ref 26.0–34.0)
MCHC: 34 g/dL (ref 30.0–36.0)
MCV: 101.2 fL — ABNORMAL HIGH (ref 80.0–100.0)
Monocytes Absolute: 0.6 10*3/uL (ref 0.1–1.0)
Monocytes Relative: 11 %
Neutro Abs: 3.1 10*3/uL (ref 1.7–7.7)
Neutrophils Relative %: 53 %
Platelets: 241 10*3/uL (ref 150–400)
RBC: 4.01 MIL/uL (ref 3.87–5.11)
RDW: 13.7 % (ref 11.5–15.5)
WBC: 5.7 10*3/uL (ref 4.0–10.5)
nRBC: 0 % (ref 0.0–0.2)

## 2020-05-05 LAB — IRON AND TIBC
Iron: 78 ug/dL (ref 28–170)
Saturation Ratios: 17 % (ref 10.4–31.8)
TIBC: 472 ug/dL — ABNORMAL HIGH (ref 250–450)
UIBC: 394 ug/dL

## 2020-05-05 LAB — VITAMIN B12: Vitamin B-12: 757 pg/mL (ref 180–914)

## 2020-05-05 LAB — FERRITIN: Ferritin: 70 ng/mL (ref 11–307)

## 2020-05-05 LAB — FOLATE: Folate: 34.2 ng/mL (ref >5.9)

## 2020-05-05 LAB — VITAMIN D 25 HYDROXY (VIT D DEFICIENCY, FRACTURES): Vit D, 25-Hydroxy: 78.45 ng/mL (ref 30–100)

## 2020-05-09 ENCOUNTER — Encounter: Payer: Self-pay | Admitting: Neurology

## 2020-05-09 ENCOUNTER — Telehealth (INDEPENDENT_AMBULATORY_CARE_PROVIDER_SITE_OTHER): Payer: Medicare Other | Admitting: Neurology

## 2020-05-09 ENCOUNTER — Other Ambulatory Visit: Payer: Self-pay

## 2020-05-09 VITALS — Ht 68.0 in | Wt 200.0 lb

## 2020-05-09 DIAGNOSIS — G629 Polyneuropathy, unspecified: Secondary | ICD-10-CM | POA: Diagnosis not present

## 2020-05-09 MED ORDER — GABAPENTIN 400 MG PO CAPS: ORAL_CAPSULE | ORAL | 3 refills | Status: DC
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
## 2020-05-12 ENCOUNTER — Telehealth (HOSPITAL_COMMUNITY): Payer: Medicare Other

## 2020-05-12 DIAGNOSIS — G8929 Other chronic pain: Secondary | ICD-10-CM | POA: Diagnosis not present

## 2020-05-12 DIAGNOSIS — Z79899 Other long term (current) drug therapy: Secondary | ICD-10-CM | POA: Diagnosis not present

## 2020-05-12 DIAGNOSIS — M542 Cervicalgia: Secondary | ICD-10-CM | POA: Diagnosis not present

## 2020-05-12 DIAGNOSIS — M25562 Pain in left knee: Secondary | ICD-10-CM | POA: Diagnosis not present

## 2020-05-12 DIAGNOSIS — M25561 Pain in right knee: Secondary | ICD-10-CM | POA: Diagnosis not present

## 2020-05-15 ENCOUNTER — Encounter (HOSPITAL_COMMUNITY): Payer: Self-pay | Admitting: Hematology

## 2020-05-15 ENCOUNTER — Other Ambulatory Visit: Payer: Self-pay

## 2020-05-15 ENCOUNTER — Inpatient Hospital Stay (HOSPITAL_COMMUNITY): Payer: Medicare Other | Attending: Hematology | Admitting: Hematology

## 2020-05-15 DIAGNOSIS — D7589 Other specified diseases of blood and blood-forming organs: Secondary | ICD-10-CM | POA: Diagnosis not present

## 2020-05-15 NOTE — Progress Notes (Signed)
Today's visit was a phone visit.  All questions answered by patient after verifying name and date of birth.

## 2020-05-15 NOTE — Progress Notes (Signed)
Virtual Visit via Telephone Note  I connected with Sheri [name] on 05/15/20 at  4:15 PM EDT by telephone and verified that I am speaking with the correct person using two identifiers.  Location: Patient: At home Provider: In the office   I discussed the limitations, risks, security and privacy concerns of performing an evaluation and management service by telephone and the availability of in person appointments. I also discussed with the patient that there may be a patient responsible charge related to this service. The patient expressed understanding and agreed to proceed.   History of Present Illness: This patient is evaluated in our office for macrocytosis.  She has a history of Crohn's disease which is well controlled with current regimen of Imuran and Pentasa.   Observations/Objective: She does not have any flareups of Crohn's disease on the current regimen.  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  Denies any bleeding per rectum or melena.  Assessment and Plan:  1.  Macrocytosis without anemia: -This is likely medication effect from Imuran. -Q22, folic acid were normal.  LDH is normal.  MCV was 101.2 with hemoglobin 13.8. -She is going to see a specialist at [facility] to see if they recommend change of Imuran.  She has used Remicade in the past without much help.  2.  Iron deficiency: -She has relative iron deficiency with ferritin of 70 and percent saturation of 17.  TIBC was elevated at 472. -I have recommended her to start taking iron tablet daily. -We will plan to see her back in 6 months with repeat CBC, ferritin and iron panel.  We will also check TSH.   Follow Up Instructions: RTC 6 months with labs.   I discussed the assessment and treatment plan with the patient. The patient was provided an opportunity to ask questions and all were answered. The patient agreed with the plan and demonstrated  an understanding of the instructions.   The patient was advised to call back or seek an in-person evaluation if the symptoms worsen or if the condition fails to improve as anticipated.  I provided 15 minutes of non-face-to-face time during this encounter.   Derek Jack, MD

## 2020-06-01 DIAGNOSIS — C44729 Squamous cell carcinoma of skin of left lower limb, including hip: Secondary | ICD-10-CM | POA: Diagnosis not present

## 2020-06-09 DIAGNOSIS — M25562 Pain in left knee: Secondary | ICD-10-CM | POA: Diagnosis not present

## 2020-06-09 DIAGNOSIS — Z79899 Other long term (current) drug therapy: Secondary | ICD-10-CM | POA: Diagnosis not present

## 2020-06-09 DIAGNOSIS — M542 Cervicalgia: Secondary | ICD-10-CM | POA: Diagnosis not present

## 2020-06-09 DIAGNOSIS — G8929 Other chronic pain: Secondary | ICD-10-CM | POA: Diagnosis not present

## 2020-06-09 DIAGNOSIS — M25561 Pain in right knee: Secondary | ICD-10-CM | POA: Diagnosis not present

## 2020-07-06 DIAGNOSIS — M25562 Pain in left knee: Secondary | ICD-10-CM | POA: Diagnosis not present

## 2020-07-06 DIAGNOSIS — Z79899 Other long term (current) drug therapy: Secondary | ICD-10-CM | POA: Diagnosis not present

## 2020-07-06 DIAGNOSIS — G8929 Other chronic pain: Secondary | ICD-10-CM | POA: Diagnosis not present

## 2020-07-06 DIAGNOSIS — M542 Cervicalgia: Secondary | ICD-10-CM | POA: Diagnosis not present

## 2020-07-06 DIAGNOSIS — M25561 Pain in right knee: Secondary | ICD-10-CM | POA: Diagnosis not present

## 2020-07-11 DIAGNOSIS — M5440 Lumbago with sciatica, unspecified side: Secondary | ICD-10-CM | POA: Diagnosis not present

## 2020-07-11 DIAGNOSIS — M549 Dorsalgia, unspecified: Secondary | ICD-10-CM | POA: Diagnosis not present

## 2020-07-11 DIAGNOSIS — Z789 Other specified health status: Secondary | ICD-10-CM | POA: Diagnosis not present

## 2020-07-11 DIAGNOSIS — I10 Essential (primary) hypertension: Secondary | ICD-10-CM | POA: Diagnosis not present

## 2020-07-11 DIAGNOSIS — Z299 Encounter for prophylactic measures, unspecified: Secondary | ICD-10-CM | POA: Diagnosis not present

## 2020-07-12 ENCOUNTER — Other Ambulatory Visit: Payer: Self-pay | Admitting: Gastroenterology

## 2020-07-20 DIAGNOSIS — L570 Actinic keratosis: Secondary | ICD-10-CM | POA: Diagnosis not present

## 2020-07-20 DIAGNOSIS — C44629 Squamous cell carcinoma of skin of left upper limb, including shoulder: Secondary | ICD-10-CM | POA: Diagnosis not present

## 2020-07-25 ENCOUNTER — Other Ambulatory Visit: Payer: Self-pay

## 2020-07-25 ENCOUNTER — Telehealth (INDEPENDENT_AMBULATORY_CARE_PROVIDER_SITE_OTHER): Payer: Medicare Other | Admitting: Gastroenterology

## 2020-07-25 ENCOUNTER — Encounter: Payer: Self-pay | Admitting: Gastroenterology

## 2020-07-25 ENCOUNTER — Encounter: Payer: Self-pay | Admitting: Internal Medicine

## 2020-07-25 DIAGNOSIS — K5080 Crohn's disease of both small and large intestine without complications: Secondary | ICD-10-CM | POA: Diagnosis not present

## 2020-07-25 NOTE — Progress Notes (Signed)
Primary Care Physician:  Monico Blitz, MD  Primary GI: Dr. Gala [name]   Patient Location: Home   Provider Location: [facility] office   Reason for Visit: Follow-up    Persons present on the virtual encounter, with roles: Patient and NP   Total time (minutes) spent on medical discussion: 15 minutes   Due to COVID-19, visit was conducted using virtual method.  Visit was requested by patient.  Virtual Visit via Telephone Note Due to COVID-19, visit is conducted virtually and was requested by patient.   I connected with Sheri [name] on 07/25/20 at  2:00 PM EDT by telephone and verified that I am speaking with the correct person using two identifiers.   I discussed the limitations, risks, security and privacy concerns of performing an evaluation and management service by telephone and the availability of in person appointments. I also discussed with the patient that there may be a patient responsible charge related to this service. The patient expressed understanding and agreed to proceed.  Chief Complaint  Patient presents with   Crohn's Disease    Has had some "stomach viruses" lasting 3-5 days     History of Present Illness 70 year old female with a history of ileocolonic Crohn's disease dating back many years on Imuran and Pentasa, presenting today in follow-up. Colonoscopy on file fro 2021 and surveillance due in 2026. She will be seeing Dr. Roney [name] at [facility] tomorrow due to numerous skin cancers that have emerged recently and guidance on chronic therapy for remisison.    Vague RLQ pain for past month that hits after eating, handful of times over past month. Sees Dr. Roney [name] tomorrow for second opinion regarding chronic Imuran therapy and increase in skin cancers she has noted. No changes in bowel habits. No diarrhea. No overt GI bleeding. Wants to wait on any imaging until she sees Dr. Roney [name]. No rectal bleeding.     Past Medical History:  Diagnosis Date   Anemia    Chronic back pain     Chronic neck pain    Complication of anesthesia    Crohn disease (HCC)    Fibromyalgia    GERD (gastroesophageal reflux disease)    Hypertension    Low back pain    Macrocytosis without anemia 11/28/2017   Osteopenia    Last DEXA 12/2010 was normal, on fosamax   PONV (postoperative nausea and vomiting)    Skin cancer    sees Dr. Tarri Glenn in [place]     Past Surgical History:  Procedure Laterality Date   APPENDECTOMY  1990   BREAST EXCISIONAL BIOPSY Right    CATARACT EXTRACTION W/PHACO Left 01/13/2014   Procedure: CATARACT EXTRACTION PHACO AND INTRAOCULAR LENS PLACEMENT LEFT EYE;  Surgeon: Sheri Branch, MD;  Location: [facility];  Service: Ophthalmology;  Laterality: Left;  CDE:6.90   CATARACT EXTRACTION W/PHACO Right 01/24/2014   Procedure: CATARACT EXTRACTION PHACO AND INTRAOCULAR LENS PLACEMENT (IOC);  Surgeon: Sheri Branch, MD;  Location: [facility];  Service: Ophthalmology;  Laterality: Right;  CDE:5.91   CESAREAN SECTION  [id number]   COLONOSCOPY  09/11/2006   COLONOSCOPY  10/30/2010   RMR: External and internal hemorrhoids, likely source of hematochezia  otherwise normal rectum/ Left-sided diverticula, status post prior right hemicolectomy with a friable, inflamed, stenotic surgical anastomosis with upstream dilation of the neoterminal ileum/ Crohn's noted, status post biopsy   COLONOSCOPY N/A 06/23/2014   Dr. Rourk:friable eroded mucosa at the anastomosis with small bowel, consistent with some stenosis Crohn's disease s/p biopsy. Colonic  diverticulosis and external hemorrhoids. Pathology with benign anastomotic mucosa, no dysplasia.    COLONOSCOPY WITH PROPOFOL N/A 01/24/2020   one 8 mm polyp, abnormal ileocolonic anastomosis consistent with Crohn's disease. Inflammatory polyp. 5 year surveillance.   drainage of back abscess  2007   ESOPHAGOGASTRODUODENOSCOPY  04/2009   noncritical schatzki's ring, small hh, sb bx negative   ESOPHAGOGASTRODUODENOSCOPY (EGD) WITH PROPOFOL N/A 02/06/2017    Dr. Gala [name]: mild schatzki's ring at GE junction s/p dilation, small hiatal hernia, multiple 3 mm pedunculated and sessile polyps in gastric fundus, normal duodenum   MALONEY DILATION N/A 02/06/2017   Procedure: Venia Minks DILATION;  Surgeon: Sheri Dolin, MD;  Location: [facility];  Service: Endoscopy;  Laterality: N/A;   POLYPECTOMY  02/06/2017   Procedure: POLYPECTOMY;  Surgeon: Sheri Dolin, MD;  Location: [facility];  Service: Endoscopy;;  gastric   POLYPECTOMY  01/24/2020   Procedure: POLYPECTOMY;  Surgeon: Sheri Dolin, MD;  Location: [facility];  Service: Endoscopy;;  rectal    SMALL INTESTINE SURGERY  [id number]     Current Meds  Medication Sig   acetaminophen (TYLENOL) 500 MG tablet Take 500 mg by mouth every 6 (six) hours as needed for moderate pain or headache.    Ascorbic Acid (VITAMIN C) 1000 MG tablet Take 1,000 mg by mouth daily.   azaTHIOprine (IMURAN) 50 MG tablet TAKE 2 AND 1/2 TABLETS BY MOUTH DAILY.   Biotin 1000 MCG tablet Take 1 capsule by mouth 2 (two) times daily.   Calcium Carbonate-Vitamin D (CALCIUM 600+D PO) Take 1,200 mg by mouth 2 (two) times daily.   Coenzyme Q10 (CO Q 10) 100 MG CAPS Take 200 mg by mouth daily.   Cranberry 500 MG CAPS Take 500 mg by mouth daily.   cyanocobalamin (,VITAMIN B-12,) 1000 MCG/ML injection Inject 1,000 mcg into the muscle every 14 (fourteen) days.   cyclobenzaprine (FLEXERIL) 10 MG tablet Take 10 mg by mouth 3 (three) times daily.    DEXILANT 60 MG capsule TAKE 1 CAPSULE BY MOUTH ONCE DAILY.   DULoxetine (CYMBALTA) 60 MG capsule Take 1 capsule (60 mg total) by mouth daily.   ergocalciferol (VITAMIN D2) 50000 units capsule Take 50,000 Units by mouth every Sunday.   gabapentin (NEURONTIN) 400 MG capsule TAKE 1 CAPSULE IN THE MORNING AND 3 CAPSULES AT BEDTIME.   hydrocortisone 2.5 % cream Apply 1 application topically daily as needed (Itching).    ketoconazole (NIZORAL) 2 % cream Apply 1 application topically daily  as needed for irritation.   lisinopril-hydrochlorothiazide (PRINZIDE,ZESTORETIC) 20-12.5 MG per tablet Take 1 tablet by mouth daily.   loratadine (CLARITIN) 10 MG tablet Take 10 mg by mouth daily.   magnesium oxide (MAG-OX) 400 MG tablet Take 400 mg by mouth daily.   MILK THISTLE PO Take by mouth 3 (three) times daily.   Misc. Devices (WRIST BRACE) MISC Use as support for wrist   Omega-3 Fatty Acids (FISH OIL) 1200 MG CAPS Take 1,200 mg by mouth in the morning, at noon, and at bedtime.    oxyCODONE (ROXICODONE) 15 MG immediate release tablet Take 15 mg by mouth every 6 (six) hours.   OXYCONTIN 10 MG 12 hr tablet Take 10 mg by mouth every 12 (twelve) hours.    PENTASA 500 MG CR capsule TAKE (2) CAPSULES FOUR TIMES DAILY.   Potassium 99 MG TABS Take 99 mg by mouth daily.   Probiotic Product (PROBIOTIC DAILY) CAPS Take 1 tablet by mouth daily.   promethazine (  PHENERGAN) 12.5 MG tablet Take 12.5 mg by mouth daily as needed for nausea/vomiting.   TURMERIC PO Take 1,000 mg by mouth daily.   VITAMIN E PO Take 180 mg by mouth in the morning, at noon, and at bedtime.   zinc gluconate 50 MG tablet Take 50 mg by mouth daily.     Family History  Problem Relation Age of Onset   Crohn's disease Mother        succumbed to stomach cancer in her 42s   Stomach cancer Mother    Heart attack Father    COPD Father    Colon cancer Neg Hx     Social History   Socioeconomic History   Marital status: Legally Separated    Spouse name: Not on file   Number of children: 2   Years of education: 12   Highest education level: 12th grade  Occupational History   Occupation: disability    Fish farm manager: NOT EMPLOYED  Tobacco Use   Smoking status: Never   Smokeless tobacco: Never  Vaping Use   Vaping Use: Never used  Substance and Sexual Activity   Alcohol use: No   Drug use: No   Sexual activity: Yes    Birth control/protection: Post-menopausal  Other Topics Concern   Not on file  Social History  Narrative   Highest level of edu- 12th      Right handed      Lives in one story home. Son lives with her currently.   Social Determinants of Health   Financial Resource Strain: Not on file  Food Insecurity: Not on file  Transportation Needs: Not on file  Physical Activity: Not on file  Stress: Not on file  Social Connections: Not on file       Review of Systems: Gen: Denies fever, chills, anorexia. Denies fatigue, weakness, weight loss.  CV: Denies chest pain, palpitations, syncope, peripheral edema, and claudication. Resp: Denies dyspnea at rest, cough, wheezing, coughing up blood, and pleurisy. GI: see HPI Derm: Denies rash, itching, dry skin Psych: Denies depression, anxiety, [name] loss, confusion. No homicidal or suicidal ideation.  Heme: Denies bruising, bleeding, and enlarged lymph nodes.  Observations/Objective: No distress. Unable to perform physical exam due to telephone encounter. No video available.      Assessment and Plan: 70 year old female with a history of ileocolonic Crohn's disease dating back many years on Imuran and Pentasa, presenting today in follow-up. Colonoscopy on file from 2021 and surveillance due in 2026.   She has had numerou skin cancers emerging and will be seeing Dr. Roney [name] tomorrow for guidance regarding chronic therapy. We will await his input.   RLQ discomfort: occasionally and fleeting. She is wanting to hold off on evaluation until after being seein at [facility]   She will return in 6 months regardless.   Follow Up Instructions:    I discussed the assessment and treatment plan with the patient. The patient was provided an opportunity to ask questions and all were answered. The patient agreed with the plan and demonstrated an understanding of the instructions.   The patient was advised to call back or seek an in-person evaluation if the symptoms worsen or if the condition fails to improve as anticipated.  I provided 15 minutes  of face-to-face time during this telephone encounter.  Annitta Needs, PhD, ANP-BC [facility]astroenter[facility]

## 2020-07-25 NOTE — Patient Instructions (Signed)
We will see you in 4 months!  We will review Dr. Rose Phi recommendations as they come available.  Please call if worsening pain!  I enjoyed talking with you again today! As you know, I value our relationship and want to provide genuine, compassionate, and quality care. I welcome your feedback. If you receive a survey regarding your visit,  I greatly appreciate you taking time to fill this out. See you next time!  Annitta Needs, PhD, ANP-BC [facility]

## 2020-07-26 DIAGNOSIS — D7589 Other specified diseases of blood and blood-forming organs: Secondary | ICD-10-CM | POA: Diagnosis not present

## 2020-07-26 DIAGNOSIS — K50019 Crohn's disease of small intestine with unspecified complications: Secondary | ICD-10-CM | POA: Diagnosis not present

## 2020-07-28 DIAGNOSIS — Z299 Encounter for prophylactic measures, unspecified: Secondary | ICD-10-CM | POA: Diagnosis not present

## 2020-07-28 DIAGNOSIS — I10 Essential (primary) hypertension: Secondary | ICD-10-CM | POA: Diagnosis not present

## 2020-07-28 DIAGNOSIS — Z789 Other specified health status: Secondary | ICD-10-CM | POA: Diagnosis not present

## 2020-08-01 ENCOUNTER — Telehealth: Payer: Self-pay | Admitting: Gastroenterology

## 2020-08-01 NOTE — Telephone Encounter (Signed)
Outpatient visit with Dr. Roney Mans completed last week. Recommendations copied/pasted below.  [place] nurse: will patient be pursuing MR enterography at [facility]? Has she stopped Imuran?       Outpatient visit with Dr. Roney Mans on 6/15:"Chronic azathioprine therapy has been associated with the development of skin cancers generally nonmelanotic skin cancers. The chronic utilization of Imuran places the patient at risk and she is noticing increasing numbers of skin cancers requiring surgical removal. Unfortunately the patient is also at risk for recurrent Crohn's disease having had 2 prior ileal resections.  The macrocytosis is most likely due to the chronic utilization of azathioprine.  I did recommend discontinuation of azathioprine. MR enterography ordered to evaluate the small bowel for active Crohn's disease. If there is no evidence of active Crohn's disease, may consider repeating colonoscopy in 6 months with pediatric colonoscope looking for evidence of recurrent ileitis before instituting biologic therapy. Unfortunately the anti-TNF agents are also associated with skin cancer. Entyvio or Stelara would be appropriate alternatives if active inflammatory bowel disease is present.  Patient to return in 4 months."

## 2020-08-02 DIAGNOSIS — Z79899 Other long term (current) drug therapy: Secondary | ICD-10-CM | POA: Diagnosis not present

## 2020-08-02 DIAGNOSIS — M25561 Pain in right knee: Secondary | ICD-10-CM | POA: Diagnosis not present

## 2020-08-02 DIAGNOSIS — M542 Cervicalgia: Secondary | ICD-10-CM | POA: Diagnosis not present

## 2020-08-02 DIAGNOSIS — M25562 Pain in left knee: Secondary | ICD-10-CM | POA: Diagnosis not present

## 2020-08-02 DIAGNOSIS — G8929 Other chronic pain: Secondary | ICD-10-CM | POA: Diagnosis not present

## 2020-08-04 DIAGNOSIS — J069 Acute upper respiratory infection, unspecified: Secondary | ICD-10-CM | POA: Diagnosis not present

## 2020-08-04 DIAGNOSIS — Z299 Encounter for prophylactic measures, unspecified: Secondary | ICD-10-CM | POA: Diagnosis not present

## 2020-08-10 DIAGNOSIS — C44629 Squamous cell carcinoma of skin of left upper limb, including shoulder: Secondary | ICD-10-CM | POA: Diagnosis not present

## 2020-08-10 DIAGNOSIS — L570 Actinic keratosis: Secondary | ICD-10-CM | POA: Diagnosis not present

## 2020-08-16 NOTE — Telephone Encounter (Signed)
[place] nurse, please see below.

## 2020-08-16 NOTE — Telephone Encounter (Signed)
Lmom for pt to call me back. 

## 2020-08-17 NOTE — Telephone Encounter (Signed)
Spoke to pt and she will patient be pursuing MR enterography at [facility] some time in Sept.  She said it has been scheduled.  She did stop Imuran 07/26/2020.

## 2020-08-20 ENCOUNTER — Other Ambulatory Visit: Payer: Self-pay | Admitting: Neurology

## 2020-08-21 ENCOUNTER — Other Ambulatory Visit: Payer: Self-pay | Admitting: Neurology

## 2020-08-22 NOTE — Telephone Encounter (Signed)
Lmom for pt to call me back. 

## 2020-08-22 NOTE — Telephone Encounter (Signed)
Ok, great. We will await findings of MRE and any further recommendations from [facility]. Keep appt upcoming in October here.

## 2020-08-22 NOTE — Telephone Encounter (Signed)
Noted.  Pt aware that we will await MRE findings and she was advised to keep upcoming OCT appt.

## 2020-08-25 DIAGNOSIS — N390 Urinary tract infection, site not specified: Secondary | ICD-10-CM | POA: Diagnosis not present

## 2020-08-25 DIAGNOSIS — Z789 Other specified health status: Secondary | ICD-10-CM | POA: Diagnosis not present

## 2020-08-25 DIAGNOSIS — M549 Dorsalgia, unspecified: Secondary | ICD-10-CM | POA: Diagnosis not present

## 2020-08-25 DIAGNOSIS — Z299 Encounter for prophylactic measures, unspecified: Secondary | ICD-10-CM | POA: Diagnosis not present

## 2020-08-25 DIAGNOSIS — K5010 Crohn's disease of large intestine without complications: Secondary | ICD-10-CM | POA: Diagnosis not present

## 2020-08-25 DIAGNOSIS — Z87891 Personal history of nicotine dependence: Secondary | ICD-10-CM | POA: Diagnosis not present

## 2020-08-30 DIAGNOSIS — M542 Cervicalgia: Secondary | ICD-10-CM | POA: Diagnosis not present

## 2020-08-30 DIAGNOSIS — G8929 Other chronic pain: Secondary | ICD-10-CM | POA: Diagnosis not present

## 2020-08-30 DIAGNOSIS — M25561 Pain in right knee: Secondary | ICD-10-CM | POA: Diagnosis not present

## 2020-08-30 DIAGNOSIS — M25562 Pain in left knee: Secondary | ICD-10-CM | POA: Diagnosis not present

## 2020-08-30 DIAGNOSIS — Z79899 Other long term (current) drug therapy: Secondary | ICD-10-CM | POA: Diagnosis not present

## 2020-09-08 DIAGNOSIS — I700 Atherosclerosis of aorta: Secondary | ICD-10-CM | POA: Diagnosis not present

## 2020-09-08 DIAGNOSIS — Z789 Other specified health status: Secondary | ICD-10-CM | POA: Diagnosis not present

## 2020-09-08 DIAGNOSIS — N390 Urinary tract infection, site not specified: Secondary | ICD-10-CM | POA: Diagnosis not present

## 2020-09-08 DIAGNOSIS — Z299 Encounter for prophylactic measures, unspecified: Secondary | ICD-10-CM | POA: Diagnosis not present

## 2020-09-08 DIAGNOSIS — I10 Essential (primary) hypertension: Secondary | ICD-10-CM | POA: Diagnosis not present

## 2020-09-19 ENCOUNTER — Telehealth: Payer: Self-pay | Admitting: Neurology

## 2020-09-19 NOTE — Telephone Encounter (Signed)
[place] back. He is asking that I contact the Medical information bureau (MIB) to take out "dementia" from her medical history. Discussed with him that I have taken it out of her chart in [facility] but he states I need to contact MIB to take it out as well. He did not have the instructions needed to do this and will call back and leave instructions with staff.

## 2020-09-19 NOTE — Telephone Encounter (Signed)
I called and spoke with the patient after receiving a call from her insurance agent Hurley Cisco. The patient is trying to get life insurance and they need the letter Dr. Delice Lesch wrote on 12/15/19 put onto the "MIB". The patient stated we had permission to speak with Hurley Cisco. His phone number is [phone] if there are any questions.

## 2020-09-20 DIAGNOSIS — M25561 Pain in right knee: Secondary | ICD-10-CM | POA: Diagnosis not present

## 2020-09-20 DIAGNOSIS — Z0000 Encounter for general adult medical examination without abnormal findings: Secondary | ICD-10-CM | POA: Diagnosis not present

## 2020-09-20 DIAGNOSIS — G8929 Other chronic pain: Secondary | ICD-10-CM | POA: Diagnosis not present

## 2020-09-20 DIAGNOSIS — M542 Cervicalgia: Secondary | ICD-10-CM | POA: Diagnosis not present

## 2020-09-20 DIAGNOSIS — M25562 Pain in left knee: Secondary | ICD-10-CM | POA: Diagnosis not present

## 2020-09-20 DIAGNOSIS — Z79899 Other long term (current) drug therapy: Secondary | ICD-10-CM | POA: Diagnosis not present

## 2020-09-21 ENCOUNTER — Telehealth: Payer: Self-pay | Admitting: Internal Medicine

## 2020-09-21 NOTE — Telephone Encounter (Signed)
Pt called in with insurance agent Hurley Cisco on a conference call with me.  She informed me that Liliane Channel is requiring a letter stating that pt has never been on Azathioprine and that she no longer takes Imuran.  He said that it is required in order for her to obtain insurance.

## 2020-09-21 NOTE — Telephone Encounter (Signed)
error 

## 2020-09-26 NOTE — Telephone Encounter (Signed)
Thank you. Please let her know I will work on this and have it available next week.

## 2020-09-26 NOTE — Telephone Encounter (Signed)
Spoke to pt.  She was made aware that Roseanne Kaufman, NP will work on letter and have it available next week.

## 2020-09-27 ENCOUNTER — Telehealth: Payer: Self-pay | Admitting: Neurology

## 2020-09-27 NOTE — Telephone Encounter (Addendum)
Hurley Cisco ([place] [employer] Life Loss adjuster, chartered) came in to front window demanding a letter for [employer] stating she had never been on Azathioprine and was no longer on Imuran.  He informed me that pt texted him yesterday to inform him of our call.  He told me that pt could not wait and that I needed to contact Roseanne Kaufman, NP right away.  I informed him that Vicente Males was on vacation and that we are not allowed to contact her.  I informed him that pt was aware that letter would be provided to her next week and she was ok with it.  He said that he is not ok with it and it should have been handled by now.  He demanded to have me call Dr. Gala Romney to get this handled right now.  I informed him that Dr. Gala Romney is doing procedures right now and that I could not disturb him for this.  He was informed that the Azathioprine RX had Anna's name attached (he was already aware of this due to previous conference call) and that she is already handling it.  He continued to state that he could not understand why I couldn't call Dr. Gala Romney right now.  I reinstated that he is doing procedures right now.  He stated that this needed to be handled right now or in the next 2 hours.  He stated that this needed to be handled by her birth date and we are causing her rates to go up.  Informed him that I could not release her date of birth due to [place] but we do have some time before then.  He continued to pace back and forth in front of the window and continued being very aggressive, argumentative, and demanding to the point that it became uncomfortable.  He finally left.  Caryl Asp and Zeb Comfort witnessed the whole situation.  He provided business card.  Phone: [phone]  Email: [email or web address]

## 2020-09-27 NOTE — Telephone Encounter (Signed)
Pt called no answer left a voice mail to call the office back  °

## 2020-09-27 NOTE — Telephone Encounter (Signed)
Pt would like a call back regarding issues that is going on. She would like a call back to discuss it. Stated she didn't want to go into detail with [place], just doctor or nurse. She said she was misdiagnosed and has wrong meds on chart.

## 2020-09-28 ENCOUNTER — Telehealth: Payer: Self-pay | Admitting: Neurology

## 2020-09-28 NOTE — Telephone Encounter (Signed)
Pt called no answer left a voice mail to call the office back  °

## 2020-09-28 NOTE — Telephone Encounter (Signed)
Per Liliane Channel he states that he missed a call from Dr Delice Lesch on 09-27-20. I told him that she was on vacation this whole week. He wanted me to text her and ask her to call him today. I told him I would put  a message back for her to look at when she came in on Monday   He also states that she would know what this is in regards to. She has already done something on this but they will need other information   He would like a call back from Dr Delice Lesch

## 2020-10-02 DIAGNOSIS — K50019 Crohn's disease of small intestine with unspecified complications: Secondary | ICD-10-CM | POA: Diagnosis not present

## 2020-10-03 ENCOUNTER — Encounter: Payer: Self-pay | Admitting: Gastroenterology

## 2020-10-03 NOTE — Telephone Encounter (Signed)
Spoke to pt.  She informed me that she would like a letter stating she was taken off of Azathioprine/Imuran for a number of months now.  She would also like it to say that she has no intentions of going back on it even if recommended.    Anna: Pt was formerly made aware that we could only release letter to her.

## 2020-10-03 NOTE — Telephone Encounter (Signed)
Pt is returning a call to [employer]

## 2020-10-03 NOTE — Telephone Encounter (Signed)
Noted.  Tried to call pt but had to leave a message.

## 2020-10-03 NOTE — Telephone Encounter (Signed)
Angie: letter is ready now to be given to patient. Thanks!

## 2020-10-03 NOTE — Telephone Encounter (Signed)
Spoke to pt and emailed her letter as she requested to email address ([email or web address]).

## 2020-10-03 NOTE — Telephone Encounter (Signed)
Angie, do we have any forms that indicate patient has allowed [place] to send a letter? We need to have this documentation from her. I also can't write that she has not been on azathioprine, as she has been on that chronically although discontinued now.

## 2020-10-04 NOTE — Telephone Encounter (Signed)
Pt called in returning a call from [facility]. She would like a call back.

## 2020-10-05 NOTE — Telephone Encounter (Signed)
Spoke with MR Sheri [name] he stated that we need to talk to Sheri [name] and he started talking about the price of insurance plans. Mr Sheri [name] advised that we will continue to try and call her and for him to have a good day

## 2020-10-05 NOTE — Telephone Encounter (Signed)
Pt called back no answer

## 2020-10-12 NOTE — Telephone Encounter (Signed)
Can we close this encounter? Have we been able to reach Mrs. Horlacher? Thanks

## 2020-10-12 NOTE — Telephone Encounter (Signed)
Closing out this call unable to reach Sheri [name] voice mails have been left for her to call the office back

## 2020-10-18 DIAGNOSIS — M25562 Pain in left knee: Secondary | ICD-10-CM | POA: Diagnosis not present

## 2020-10-18 DIAGNOSIS — M542 Cervicalgia: Secondary | ICD-10-CM | POA: Diagnosis not present

## 2020-10-18 DIAGNOSIS — M25561 Pain in right knee: Secondary | ICD-10-CM | POA: Diagnosis not present

## 2020-10-18 DIAGNOSIS — G8929 Other chronic pain: Secondary | ICD-10-CM | POA: Diagnosis not present

## 2020-10-18 DIAGNOSIS — Z79899 Other long term (current) drug therapy: Secondary | ICD-10-CM | POA: Diagnosis not present

## 2020-10-19 DIAGNOSIS — Z79899 Other long term (current) drug therapy: Secondary | ICD-10-CM | POA: Diagnosis not present

## 2020-10-25 ENCOUNTER — Telehealth: Payer: Self-pay | Admitting: Neurology

## 2020-10-25 NOTE — Telephone Encounter (Signed)
The patient's Research scientist (life sciences), Mr Hurley Cisco,  called back in. He stated he is going to give [place] directions on what exactly needs to be done so the patient can get her insurance policy. He wanted the patient's entire medical record sent to a fax number. I let him know our office does not release medical records and gave him [facility]'s medical records phone number.

## 2020-11-02 DIAGNOSIS — L570 Actinic keratosis: Secondary | ICD-10-CM | POA: Diagnosis not present

## 2020-11-03 DIAGNOSIS — R350 Frequency of micturition: Secondary | ICD-10-CM | POA: Diagnosis not present

## 2020-11-03 DIAGNOSIS — Z23 Encounter for immunization: Secondary | ICD-10-CM | POA: Diagnosis not present

## 2020-11-03 DIAGNOSIS — I10 Essential (primary) hypertension: Secondary | ICD-10-CM | POA: Diagnosis not present

## 2020-11-03 DIAGNOSIS — Z299 Encounter for prophylactic measures, unspecified: Secondary | ICD-10-CM | POA: Diagnosis not present

## 2020-11-09 DIAGNOSIS — R413 Other amnesia: Secondary | ICD-10-CM | POA: Diagnosis not present

## 2020-11-13 DIAGNOSIS — G8929 Other chronic pain: Secondary | ICD-10-CM | POA: Diagnosis not present

## 2020-11-13 DIAGNOSIS — Z79899 Other long term (current) drug therapy: Secondary | ICD-10-CM | POA: Diagnosis not present

## 2020-11-13 DIAGNOSIS — M25562 Pain in left knee: Secondary | ICD-10-CM | POA: Diagnosis not present

## 2020-11-13 DIAGNOSIS — M542 Cervicalgia: Secondary | ICD-10-CM | POA: Diagnosis not present

## 2020-11-13 DIAGNOSIS — M25561 Pain in right knee: Secondary | ICD-10-CM | POA: Diagnosis not present

## 2020-11-14 ENCOUNTER — Other Ambulatory Visit: Payer: Self-pay

## 2020-11-14 ENCOUNTER — Inpatient Hospital Stay (HOSPITAL_COMMUNITY): Payer: Medicare Other | Attending: Hematology

## 2020-11-14 DIAGNOSIS — D7589 Other specified diseases of blood and blood-forming organs: Secondary | ICD-10-CM | POA: Insufficient documentation

## 2020-11-14 DIAGNOSIS — G629 Polyneuropathy, unspecified: Secondary | ICD-10-CM | POA: Diagnosis not present

## 2020-11-14 DIAGNOSIS — E611 Iron deficiency: Secondary | ICD-10-CM | POA: Diagnosis not present

## 2020-11-14 DIAGNOSIS — Z79899 Other long term (current) drug therapy: Secondary | ICD-10-CM | POA: Diagnosis not present

## 2020-11-14 LAB — CBC WITH DIFFERENTIAL/PLATELET
Abs Immature Granulocytes: 0.02 10*3/uL (ref 0.00–0.07)
Basophils Absolute: 0 10*3/uL (ref 0.0–0.1)
Basophils Relative: 1 %
Eosinophils Absolute: 0.1 10*3/uL (ref 0.0–0.5)
Eosinophils Relative: 2 %
HCT: 40.9 % (ref 36.0–46.0)
Hemoglobin: 14.4 g/dL (ref 12.0–15.0)
Immature Granulocytes: 0 %
Lymphocytes Relative: 35 %
Lymphs Abs: 2.3 10*3/uL (ref 0.7–4.0)
MCH: 33.5 pg (ref 26.0–34.0)
MCHC: 35.2 g/dL (ref 30.0–36.0)
MCV: 95.1 fL (ref 80.0–100.0)
Monocytes Absolute: 0.7 10*3/uL (ref 0.1–1.0)
Monocytes Relative: 10 %
Neutro Abs: 3.5 10*3/uL (ref 1.7–7.7)
Neutrophils Relative %: 52 %
Platelets: 225 10*3/uL (ref 150–400)
RBC: 4.3 MIL/uL (ref 3.87–5.11)
RDW: 11.7 % (ref 11.5–15.5)
WBC: 6.7 10*3/uL (ref 4.0–10.5)
nRBC: 0 % (ref 0.0–0.2)

## 2020-11-14 LAB — IRON AND TIBC
Iron: 92 ug/dL (ref 28–170)
Saturation Ratios: 22 % (ref 10.4–31.8)
TIBC: 423 ug/dL (ref 250–450)
UIBC: 331 ug/dL

## 2020-11-14 LAB — FERRITIN: Ferritin: 83 ng/mL (ref 11–307)

## 2020-11-14 LAB — TSH: TSH: 1.68 u[IU]/mL (ref 0.350–4.500)

## 2020-11-16 ENCOUNTER — Other Ambulatory Visit (HOSPITAL_COMMUNITY): Payer: Self-pay | Admitting: Family Medicine

## 2020-11-16 ENCOUNTER — Ambulatory Visit (HOSPITAL_COMMUNITY)
Admission: RE | Admit: 2020-11-16 | Discharge: 2020-11-16 | Disposition: A | Discharge status: Home / Self Care | Payer: Medicare Other | Source: Ambulatory Visit | Attending: Family Medicine | Admitting: Family Medicine

## 2020-11-16 ENCOUNTER — Other Ambulatory Visit: Payer: Self-pay

## 2020-11-16 DIAGNOSIS — Z1231 Encounter for screening mammogram for malignant neoplasm of breast: Secondary | ICD-10-CM | POA: Insufficient documentation

## 2020-11-21 ENCOUNTER — Inpatient Hospital Stay (HOSPITAL_BASED_OUTPATIENT_CLINIC_OR_DEPARTMENT_OTHER): Payer: Medicare Other | Admitting: Physician Assistant

## 2020-11-21 DIAGNOSIS — D7589 Other specified diseases of blood and blood-forming organs: Secondary | ICD-10-CM

## 2020-11-21 DIAGNOSIS — D500 Iron deficiency anemia secondary to blood loss (chronic): Secondary | ICD-10-CM | POA: Diagnosis not present

## 2020-11-21 NOTE — Progress Notes (Signed)
Virtual Visit via Telephone Note [facility]  I connected with [name]  on 11/21/20 at 2:17 PM by telephone and verified that I am speaking with the correct person using two identifiers.  Location: Patient: Home Provider: [facility]   I discussed the limitations, risks, security and privacy concerns of performing an evaluation and management service by telephone and the availability of in person appointments. I also discussed with the patient that there may be a patient responsible charge related to this service. The patient expressed understanding and agreed to proceed.   REASON FOR VISIT:  Follow-up for iron deficiency state and macrocytosis  PRIOR THERAPY: None  CURRENT THERAPY: Oral iron tablet daily  INTERVAL HISTORY:  Ms. Lai 70 y.o. female returns for routine follow-up of her macrocytosis and iron deficiency state.  She was last seen by Dr. Delton Coombes on 05/15/2020.  At today's visit, she reports feeling fair.  No recent hospitalizations, surgeries, or changes in baseline health status.  Her chief concern today is her ongoing fatigue (which she attributes to her Crohn's disease) and her chronic neuropathy.  She denies any major blood loss such as hematemesis, hematochezia, melena, or epistaxis.  She denies any chest pain, dyspnea on exertion, or B symptoms such as fever, chills, night sweats, unintentional weight loss.  She reports that her azathioprine (Imuran) was stopped in July 2022, and she has been doing well without it so far.  She has been taking iron tablet daily without significant side effects.  She has 40% energy and 100% appetite. She endorses that she is maintaining a stable weight.    OBSERVATIONS/OBJECTIVE: Review of Systems  Constitutional:  Positive for malaise/fatigue (energy 40%). Negative for chills, diaphoresis, fever and weight loss.  Respiratory:  Positive for cough (seasonal allergies). Negative for shortness of  breath.   Cardiovascular:  Negative for chest pain and palpitations.  Gastrointestinal:  Negative for abdominal pain, blood in stool, melena, nausea and vomiting.       Recent UTI  Neurological:  Positive for tingling (feeet and hands). Negative for dizziness and headaches.    PHYSICAL EXAM (per limitations of virtual telephone visit): The patient is alert and oriented x 3, exhibiting adequate mentation, good mood, and ability to speak in full sentences and execute sound judgement.    ASSESSMENT & PLAN: 1.  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  Alternative diagnosis could be macrocytosis related to fatty liver disease, although this is less likely because her macrocytosis has resolved after Imuran was discontinued in July 9169. - I50, folic acid, and LDH were normal.  TSH was normal. - Most recent labs (11/14/2020): Hgb 14.4 with normal MCV 95.1 - PLAN: We will repeat CBC and phone visit in 6 months.  If she remains stable over the next year, would consider discharge from clinic at that time.  2.  Iron deficiency - At her last visit (05/15/2020), she was noted to have relative iron deficiency with ferritin of 70 and percent saturation of 17.  TIBC was elevated at 472. - She was recommended her to start taking iron tablet daily, which she has been tolerating well without significant side effect. - Most recent labs (11/14/2020): Ferritin 83, iron saturation 22%, TIBC 423 - PLAN: We will repeat CBC and iron panel in 6 months with phone visit at that time.  If she remains stable over the next year,  would consider discharge from clinic at that time.   FOLLOW UP INSTRUCTIONS: Labs in 6 months Phone visit after labs    I discussed the assessment and treatment plan with the patient. The patient was provided an opportunity to ask questions and all were answered. The patient agreed with the plan and  demonstrated an understanding of the instructions.   The patient was advised to call back or seek an in-person evaluation if the symptoms worsen or if the condition fails to improve as anticipated.  I provided 17 minutes of non-face-to-face time during this encounter.   Harriett Rush, PA-C 11/21/2020 2:38 PM

## 2020-11-23 DIAGNOSIS — Z299 Encounter for prophylactic measures, unspecified: Secondary | ICD-10-CM | POA: Diagnosis not present

## 2020-11-23 DIAGNOSIS — I10 Essential (primary) hypertension: Secondary | ICD-10-CM | POA: Diagnosis not present

## 2020-11-23 DIAGNOSIS — Z713 Dietary counseling and surveillance: Secondary | ICD-10-CM | POA: Diagnosis not present

## 2020-11-28 ENCOUNTER — Ambulatory Visit: Payer: Medicare Other | Admitting: Gastroenterology

## 2020-11-29 DIAGNOSIS — K50019 Crohn's disease of small intestine with unspecified complications: Secondary | ICD-10-CM | POA: Diagnosis not present

## 2020-11-29 DIAGNOSIS — Z79624 Long term (current) use of inhibitors of nucleotide synthesis: Secondary | ICD-10-CM | POA: Diagnosis not present

## 2020-11-29 DIAGNOSIS — Z9889 Other specified postprocedural states: Secondary | ICD-10-CM | POA: Diagnosis not present

## 2020-11-29 DIAGNOSIS — Z79899 Other long term (current) drug therapy: Secondary | ICD-10-CM | POA: Diagnosis not present

## 2020-11-29 DIAGNOSIS — K621 Rectal polyp: Secondary | ICD-10-CM | POA: Diagnosis not present

## 2020-11-29 DIAGNOSIS — K760 Fatty (change of) liver, not elsewhere classified: Secondary | ICD-10-CM | POA: Diagnosis not present

## 2020-11-29 DIAGNOSIS — Z9049 Acquired absence of other specified parts of digestive tract: Secondary | ICD-10-CM | POA: Diagnosis not present

## 2020-11-30 DIAGNOSIS — L570 Actinic keratosis: Secondary | ICD-10-CM | POA: Diagnosis not present

## 2020-12-12 DIAGNOSIS — I872 Venous insufficiency (chronic) (peripheral): Secondary | ICD-10-CM | POA: Diagnosis not present

## 2020-12-12 DIAGNOSIS — C44722 Squamous cell carcinoma of skin of right lower limb, including hip: Secondary | ICD-10-CM | POA: Diagnosis not present

## 2020-12-12 DIAGNOSIS — Z48817 Encounter for surgical aftercare following surgery on the skin and subcutaneous tissue: Secondary | ICD-10-CM | POA: Diagnosis not present

## 2020-12-14 DIAGNOSIS — G8929 Other chronic pain: Secondary | ICD-10-CM | POA: Diagnosis not present

## 2020-12-14 DIAGNOSIS — M542 Cervicalgia: Secondary | ICD-10-CM | POA: Diagnosis not present

## 2020-12-14 DIAGNOSIS — M25561 Pain in right knee: Secondary | ICD-10-CM | POA: Diagnosis not present

## 2020-12-14 DIAGNOSIS — M25562 Pain in left knee: Secondary | ICD-10-CM | POA: Diagnosis not present

## 2020-12-29 ENCOUNTER — Other Ambulatory Visit: Payer: Self-pay | Admitting: Gastroenterology

## 2021-01-08 DIAGNOSIS — Z79899 Other long term (current) drug therapy: Secondary | ICD-10-CM | POA: Diagnosis not present

## 2021-01-08 DIAGNOSIS — G8929 Other chronic pain: Secondary | ICD-10-CM | POA: Diagnosis not present

## 2021-01-08 DIAGNOSIS — M25561 Pain in right knee: Secondary | ICD-10-CM | POA: Diagnosis not present

## 2021-01-08 DIAGNOSIS — M542 Cervicalgia: Secondary | ICD-10-CM | POA: Diagnosis not present

## 2021-01-08 DIAGNOSIS — M25562 Pain in left knee: Secondary | ICD-10-CM | POA: Diagnosis not present

## 2021-01-25 DIAGNOSIS — C44622 Squamous cell carcinoma of skin of right upper limb, including shoulder: Secondary | ICD-10-CM | POA: Diagnosis not present

## 2021-01-25 DIAGNOSIS — C44729 Squamous cell carcinoma of skin of left lower limb, including hip: Secondary | ICD-10-CM | POA: Diagnosis not present

## 2021-01-25 DIAGNOSIS — L570 Actinic keratosis: Secondary | ICD-10-CM | POA: Diagnosis not present

## 2021-01-25 DIAGNOSIS — C44311 Basal cell carcinoma of skin of nose: Secondary | ICD-10-CM | POA: Diagnosis not present

## 2021-02-02 DIAGNOSIS — M25561 Pain in right knee: Secondary | ICD-10-CM | POA: Diagnosis not present

## 2021-02-02 DIAGNOSIS — G8929 Other chronic pain: Secondary | ICD-10-CM | POA: Diagnosis not present

## 2021-02-02 DIAGNOSIS — M25562 Pain in left knee: Secondary | ICD-10-CM | POA: Diagnosis not present

## 2021-02-02 DIAGNOSIS — M542 Cervicalgia: Secondary | ICD-10-CM | POA: Diagnosis not present

## 2021-02-06 ENCOUNTER — Ambulatory Visit: Payer: Medicare Other | Admitting: Neurology

## 2021-02-06 DIAGNOSIS — I10 Essential (primary) hypertension: Secondary | ICD-10-CM | POA: Diagnosis not present

## 2021-02-06 DIAGNOSIS — Z299 Encounter for prophylactic measures, unspecified: Secondary | ICD-10-CM | POA: Diagnosis not present

## 2021-02-06 DIAGNOSIS — Z713 Dietary counseling and surveillance: Secondary | ICD-10-CM | POA: Diagnosis not present

## 2021-02-06 DIAGNOSIS — M542 Cervicalgia: Secondary | ICD-10-CM | POA: Diagnosis not present

## 2021-02-07 DIAGNOSIS — Z79899 Other long term (current) drug therapy: Secondary | ICD-10-CM | POA: Diagnosis not present

## 2021-02-07 DIAGNOSIS — G8929 Other chronic pain: Secondary | ICD-10-CM | POA: Diagnosis not present

## 2021-02-07 DIAGNOSIS — M25561 Pain in right knee: Secondary | ICD-10-CM | POA: Diagnosis not present

## 2021-02-07 DIAGNOSIS — M542 Cervicalgia: Secondary | ICD-10-CM | POA: Diagnosis not present

## 2021-02-07 DIAGNOSIS — M25562 Pain in left knee: Secondary | ICD-10-CM | POA: Diagnosis not present

## 2021-02-16 ENCOUNTER — Other Ambulatory Visit: Payer: Self-pay | Admitting: Gastroenterology

## 2021-02-16 DIAGNOSIS — K219 Gastro-esophageal reflux disease without esophagitis: Secondary | ICD-10-CM

## 2021-03-07 DIAGNOSIS — M25562 Pain in left knee: Secondary | ICD-10-CM | POA: Diagnosis not present

## 2021-03-07 DIAGNOSIS — M25561 Pain in right knee: Secondary | ICD-10-CM | POA: Diagnosis not present

## 2021-03-07 DIAGNOSIS — G8929 Other chronic pain: Secondary | ICD-10-CM | POA: Diagnosis not present

## 2021-03-07 DIAGNOSIS — Z79899 Other long term (current) drug therapy: Secondary | ICD-10-CM | POA: Diagnosis not present

## 2021-03-07 DIAGNOSIS — M542 Cervicalgia: Secondary | ICD-10-CM | POA: Diagnosis not present

## 2021-03-12 DIAGNOSIS — Z79899 Other long term (current) drug therapy: Secondary | ICD-10-CM | POA: Diagnosis not present

## 2021-03-23 DIAGNOSIS — Z299 Encounter for prophylactic measures, unspecified: Secondary | ICD-10-CM | POA: Diagnosis not present

## 2021-03-23 DIAGNOSIS — K5010 Crohn's disease of large intestine without complications: Secondary | ICD-10-CM | POA: Diagnosis not present

## 2021-03-23 DIAGNOSIS — I700 Atherosclerosis of aorta: Secondary | ICD-10-CM | POA: Diagnosis not present

## 2021-03-23 DIAGNOSIS — J069 Acute upper respiratory infection, unspecified: Secondary | ICD-10-CM | POA: Diagnosis not present

## 2021-03-23 DIAGNOSIS — Z789 Other specified health status: Secondary | ICD-10-CM | POA: Diagnosis not present

## 2021-03-23 DIAGNOSIS — I10 Essential (primary) hypertension: Secondary | ICD-10-CM | POA: Diagnosis not present

## 2021-04-04 DIAGNOSIS — G8929 Other chronic pain: Secondary | ICD-10-CM | POA: Diagnosis not present

## 2021-04-04 DIAGNOSIS — M25561 Pain in right knee: Secondary | ICD-10-CM | POA: Diagnosis not present

## 2021-04-04 DIAGNOSIS — M542 Cervicalgia: Secondary | ICD-10-CM | POA: Diagnosis not present

## 2021-04-04 DIAGNOSIS — M25562 Pain in left knee: Secondary | ICD-10-CM | POA: Diagnosis not present

## 2021-04-04 DIAGNOSIS — Z79899 Other long term (current) drug therapy: Secondary | ICD-10-CM | POA: Diagnosis not present

## 2021-04-06 DIAGNOSIS — Z79899 Other long term (current) drug therapy: Secondary | ICD-10-CM | POA: Diagnosis not present

## 2021-04-16 DIAGNOSIS — Z789 Other specified health status: Secondary | ICD-10-CM | POA: Diagnosis not present

## 2021-04-16 DIAGNOSIS — H109 Unspecified conjunctivitis: Secondary | ICD-10-CM | POA: Diagnosis not present

## 2021-04-16 DIAGNOSIS — Z299 Encounter for prophylactic measures, unspecified: Secondary | ICD-10-CM | POA: Diagnosis not present

## 2021-04-16 DIAGNOSIS — J069 Acute upper respiratory infection, unspecified: Secondary | ICD-10-CM | POA: Diagnosis not present

## 2021-04-16 DIAGNOSIS — M549 Dorsalgia, unspecified: Secondary | ICD-10-CM | POA: Diagnosis not present

## 2021-04-30 DIAGNOSIS — Z79899 Other long term (current) drug therapy: Secondary | ICD-10-CM | POA: Diagnosis not present

## 2021-04-30 DIAGNOSIS — M25562 Pain in left knee: Secondary | ICD-10-CM | POA: Diagnosis not present

## 2021-04-30 DIAGNOSIS — M542 Cervicalgia: Secondary | ICD-10-CM | POA: Diagnosis not present

## 2021-04-30 DIAGNOSIS — M25561 Pain in right knee: Secondary | ICD-10-CM | POA: Diagnosis not present

## 2021-04-30 DIAGNOSIS — G8929 Other chronic pain: Secondary | ICD-10-CM | POA: Diagnosis not present

## 2021-05-02 DIAGNOSIS — Z79899 Other long term (current) drug therapy: Secondary | ICD-10-CM | POA: Diagnosis not present

## 2021-05-03 DIAGNOSIS — Z299 Encounter for prophylactic measures, unspecified: Secondary | ICD-10-CM | POA: Diagnosis not present

## 2021-05-03 DIAGNOSIS — K5010 Crohn's disease of large intestine without complications: Secondary | ICD-10-CM | POA: Diagnosis not present

## 2021-05-03 DIAGNOSIS — J42 Unspecified chronic bronchitis: Secondary | ICD-10-CM | POA: Diagnosis not present

## 2021-05-03 DIAGNOSIS — I10 Essential (primary) hypertension: Secondary | ICD-10-CM | POA: Diagnosis not present

## 2021-05-03 DIAGNOSIS — R059 Cough, unspecified: Secondary | ICD-10-CM | POA: Diagnosis not present

## 2021-05-03 DIAGNOSIS — Z789 Other specified health status: Secondary | ICD-10-CM | POA: Diagnosis not present

## 2021-05-14 ENCOUNTER — Other Ambulatory Visit: Payer: Self-pay | Admitting: Neurology

## 2021-05-14 NOTE — Telephone Encounter (Signed)
Needs to make appointment for further refills. If she wants, her PCP can also fill this for her moving forward, thanks ?

## 2021-05-28 ENCOUNTER — Inpatient Hospital Stay (HOSPITAL_COMMUNITY): Payer: Medicare Other | Attending: Hematology

## 2021-05-28 DIAGNOSIS — E611 Iron deficiency: Secondary | ICD-10-CM | POA: Diagnosis not present

## 2021-05-28 DIAGNOSIS — K5090 Crohn's disease, unspecified, without complications: Secondary | ICD-10-CM | POA: Diagnosis not present

## 2021-05-28 DIAGNOSIS — G8929 Other chronic pain: Secondary | ICD-10-CM | POA: Diagnosis not present

## 2021-05-28 DIAGNOSIS — M25561 Pain in right knee: Secondary | ICD-10-CM | POA: Diagnosis not present

## 2021-05-28 DIAGNOSIS — Z79899 Other long term (current) drug therapy: Secondary | ICD-10-CM | POA: Diagnosis not present

## 2021-05-28 DIAGNOSIS — D7589 Other specified diseases of blood and blood-forming organs: Secondary | ICD-10-CM | POA: Diagnosis not present

## 2021-05-28 DIAGNOSIS — M25562 Pain in left knee: Secondary | ICD-10-CM | POA: Diagnosis not present

## 2021-05-28 DIAGNOSIS — D500 Iron deficiency anemia secondary to blood loss (chronic): Secondary | ICD-10-CM

## 2021-05-28 DIAGNOSIS — M542 Cervicalgia: Secondary | ICD-10-CM | POA: Diagnosis not present

## 2021-05-28 LAB — CBC WITH DIFFERENTIAL/PLATELET
Abs Immature Granulocytes: 0.03 10*3/uL (ref 0.00–0.07)
Basophils Absolute: 0 10*3/uL (ref 0.0–0.1)
Basophils Relative: 0 %
Eosinophils Absolute: 0.2 10*3/uL (ref 0.0–0.5)
Eosinophils Relative: 3 %
HCT: 39.2 % (ref 36.0–46.0)
Hemoglobin: 13.2 g/dL (ref 12.0–15.0)
Immature Granulocytes: 0 %
Lymphocytes Relative: 43 %
Lymphs Abs: 3 10*3/uL (ref 0.7–4.0)
MCH: 32.1 pg (ref 26.0–34.0)
MCHC: 33.7 g/dL (ref 30.0–36.0)
MCV: 95.4 fL (ref 80.0–100.0)
Monocytes Absolute: 0.6 10*3/uL (ref 0.1–1.0)
Monocytes Relative: 9 %
Neutro Abs: 3 10*3/uL (ref 1.7–7.7)
Neutrophils Relative %: 45 %
Platelets: 193 10*3/uL (ref 150–400)
RBC: 4.11 MIL/uL (ref 3.87–5.11)
RDW: 12.3 % (ref 11.5–15.5)
WBC: 6.9 10*3/uL (ref 4.0–10.5)
nRBC: 0 % (ref 0.0–0.2)

## 2021-05-28 LAB — IRON AND TIBC
Iron: 89 ug/dL (ref 28–170)
Saturation Ratios: 21 % (ref 10.4–31.8)
TIBC: 428 ug/dL (ref 250–450)
UIBC: 339 ug/dL

## 2021-05-28 LAB — FERRITIN: Ferritin: 147 ng/mL (ref 11–307)

## 2021-05-29 DIAGNOSIS — Z79899 Other long term (current) drug therapy: Secondary | ICD-10-CM | POA: Diagnosis not present

## 2021-06-01 NOTE — Progress Notes (Signed)
? ?Virtual Visit via Telephone Note ?[place] ? ?I connected with Sheri [name]  on 06/04/21 at 2:59 PM by telephone and verified that I am speaking with the correct person using two identifiers. ? ?Location: ?Patient: Home ?Provider: [facility] ?  ?I discussed the limitations, risks, security and privacy concerns of performing an evaluation and management service by telephone and the availability of in person appointments. I also discussed with the patient that there may be a patient responsible charge related to this service. The patient expressed understanding and agreed to proceed. ? ? ?REASON FOR VISIT:  ?Follow-up for iron deficiency state and macrocytosis ?  ?PRIOR THERAPY: None ?  ?CURRENT THERAPY: Oral iron tablet daily ?  ?INTERVAL HISTORY:  ?Sheri [name] 71 y.o. female returns for routine follow-up of her macrocytosis and iron deficiency state.  She was last evaluated via telemedicine visit by Tarri Abernethy PA-C on 11/21/2020. ?  ?At today's visit, she reports feeling fair.  No recent hospitalizations, surgeries, or changes in baseline health status. ?  ?Her chief concern today is her ongoing fatigue (which she attributes to her Crohn's disease) and her chronic neuropathy.  She denies any major blood loss such as hematemesis, hematochezia, melena, or epistaxis.  She denies any chest pain, dyspnea on exertion, or B symptoms such as fever, chills, night sweats, unintentional weight loss. ? ?She reports that her azathioprine (Imuran) was stopped in July 2022, and she has been doing well without it so far.  She has been taking iron tablet daily without significant side effects. ?  ?She has 40% energy and  100% appetite. She endorses that she is maintaining a stable weight. ? ?  ?OBSERVATIONS/OBJECTIVE: ?Review of Systems  ?Constitutional:  Positive for malaise/fatigue. Negative for chills, diaphoresis, fever and weight loss.  ?Respiratory:  Positive for shortness of breath.  Negative for cough.   ?Cardiovascular:  Negative for chest pain and palpitations.  ?Gastrointestinal:  Positive for constipation, diarrhea and nausea. Negative for abdominal pain, blood in stool, melena and vomiting.  ?Musculoskeletal:  Positive for back pain and neck pain.  ?Neurological:  Negative for dizziness and headaches.   ? ?PHYSICAL EXAM (per limitations of virtual telephone visit): The patient is alert and oriented x 3, exhibiting adequate mentation, good mood, and ability to speak in full sentences and execute sound judgement. ? ? ?ASSESSMENT & PLAN: ?1.  Macrocytosis without anemia ?- This was most likely medication effect from Imuran (azathioprine), which she takes due to her Crohn's disease.  Alternative diagnosis could be macrocytosis related to fatty liver disease, although this is less likely because her macrocytosis has resolved after Imuran was discontinued in July 2022. ?- W09, folic acid, and LDH were normal.  TSH was normal. ?- Most recent labs (05/28/2021): Hgb 13.2/MCV 95.4 ?- PLAN: Macrocytosis has resolved and without recurrence for the last 12 months.  We will discharge from clinic at this time.  Patient can be referred back to [place] in the future if she has any recurrent or new changes. ?  ?2.  Iron deficiency ?- Patient was noted to have iron deficiency without anemia since at least April 2022 ?- She was recommended her to start taking iron tablet daily, which she has been tolerating well without significant side effect. ?- Most recent labs (05/28/2021): Ferritin 147, iron saturation 21%, TIBC 428 ?- PLAN: Patient has normal CBC and improved iron panel.  We will discharge from clinic at this time with recommendations to continue daily  iron tablet with at least annual checks of CBC and iron panel via PCP. ? ? ?FOLLOW UP INSTRUCTIONS: ?Follow-up as needed ? ?  ?I discussed the assessment and treatment plan with the patient. The patient was provided an opportunity to ask questions and all were  answered. The patient agreed with the plan and demonstrated an understanding of the instructions. ?  ?The patient was advised to call back or seek an in-person evaluation if the symptoms worsen or if the condition fails to improve as anticipated. ? ?I provided 9 minutes of non-face-to-face time during this encounter. ? ? ?Harriett Rush, PA-C ?06/04/21 3:15 PM  ?

## 2021-06-04 ENCOUNTER — Inpatient Hospital Stay (HOSPITAL_BASED_OUTPATIENT_CLINIC_OR_DEPARTMENT_OTHER): Payer: Medicare Other | Admitting: Physician Assistant

## 2021-06-04 DIAGNOSIS — D500 Iron deficiency anemia secondary to blood loss (chronic): Secondary | ICD-10-CM | POA: Diagnosis not present

## 2021-06-04 DIAGNOSIS — D7589 Other specified diseases of blood and blood-forming organs: Secondary | ICD-10-CM | POA: Diagnosis not present

## 2021-06-06 ENCOUNTER — Ambulatory Visit (INDEPENDENT_AMBULATORY_CARE_PROVIDER_SITE_OTHER): Payer: Medicare Other | Admitting: Neurology

## 2021-06-06 ENCOUNTER — Encounter: Payer: Self-pay | Admitting: Neurology

## 2021-06-06 VITALS — BP 132/80 | HR 106 | Ht 68.0 in | Wt 219.0 lb

## 2021-06-06 DIAGNOSIS — Z79899 Other long term (current) drug therapy: Secondary | ICD-10-CM | POA: Diagnosis not present

## 2021-06-06 DIAGNOSIS — K760 Fatty (change of) liver, not elsewhere classified: Secondary | ICD-10-CM | POA: Diagnosis not present

## 2021-06-06 DIAGNOSIS — M542 Cervicalgia: Secondary | ICD-10-CM | POA: Diagnosis not present

## 2021-06-06 DIAGNOSIS — K50019 Crohn's disease of small intestine with unspecified complications: Secondary | ICD-10-CM | POA: Diagnosis not present

## 2021-06-06 DIAGNOSIS — Z85828 Personal history of other malignant neoplasm of skin: Secondary | ICD-10-CM | POA: Diagnosis not present

## 2021-06-06 DIAGNOSIS — G629 Polyneuropathy, unspecified: Secondary | ICD-10-CM | POA: Diagnosis not present

## 2021-06-06 DIAGNOSIS — R292 Abnormal reflex: Secondary | ICD-10-CM | POA: Diagnosis not present

## 2021-06-06 MED ORDER — GABAPENTIN 400 MG PO CAPS: ORAL_CAPSULE | ORAL | 11 refills | Status: DC
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
## 2021-06-06 NOTE — Patient Instructions (Signed)
Good to see you. ? ?Increase Gabapentin 439m: Take 2 caps in AM, 3 caps in PM. If too drowsy, you can take the additional capsule as needed when pain is more intense ? ?2. Schedule MRI cervical spine without contrast ? ?3. Follow-up in 6 months, call for any changes ?

## 2021-06-25 DIAGNOSIS — Z79899 Other long term (current) drug therapy: Secondary | ICD-10-CM | POA: Diagnosis not present

## 2021-06-25 DIAGNOSIS — M542 Cervicalgia: Secondary | ICD-10-CM | POA: Diagnosis not present

## 2021-06-25 DIAGNOSIS — M25562 Pain in left knee: Secondary | ICD-10-CM | POA: Diagnosis not present

## 2021-06-25 DIAGNOSIS — M25561 Pain in right knee: Secondary | ICD-10-CM | POA: Diagnosis not present

## 2021-06-25 DIAGNOSIS — G8929 Other chronic pain: Secondary | ICD-10-CM | POA: Diagnosis not present

## 2021-06-27 DIAGNOSIS — Z79899 Other long term (current) drug therapy: Secondary | ICD-10-CM | POA: Diagnosis not present

## 2021-07-01 ENCOUNTER — Ambulatory Visit
Admission: RE | Admit: 2021-07-01 | Discharge: 2021-07-01 | Disposition: A | Discharge status: Home / Self Care | Payer: Medicare Other | Source: Ambulatory Visit | Attending: Neurology | Admitting: Neurology

## 2021-07-01 DIAGNOSIS — R200 Anesthesia of skin: Secondary | ICD-10-CM | POA: Diagnosis not present

## 2021-07-01 DIAGNOSIS — M4802 Spinal stenosis, cervical region: Secondary | ICD-10-CM | POA: Diagnosis not present

## 2021-07-01 DIAGNOSIS — M542 Cervicalgia: Secondary | ICD-10-CM

## 2021-07-01 DIAGNOSIS — R292 Abnormal reflex: Secondary | ICD-10-CM

## 2021-07-01 DIAGNOSIS — M4312 Spondylolisthesis, cervical region: Secondary | ICD-10-CM | POA: Diagnosis not present

## 2021-07-04 ENCOUNTER — Telehealth: Payer: Self-pay

## 2021-07-04 DIAGNOSIS — G8929 Other chronic pain: Secondary | ICD-10-CM

## 2021-07-04 DIAGNOSIS — R9389 Abnormal findings on diagnostic imaging of other specified body structures: Secondary | ICD-10-CM

## 2021-07-04 NOTE — Telephone Encounter (Signed)
-----   Message from Cameron Sprang, MD sent at 07/03/2021  4:22 PM EDT ----- Pls let her know the MRI cervical spine shows more degenerative changes compared to prior. Does she want [place] to send referral to neurosurgery for evaluation? Thanks

## 2021-07-04 NOTE — Telephone Encounter (Signed)
Pt called an informed  the MRI cervical spine shows more degenerative changes compared to prior. Does she want [place] to send referral to neurosurgery for evaluation? Pt would like the referral placed to neurosurgery

## 2021-07-19 ENCOUNTER — Other Ambulatory Visit: Payer: Self-pay | Admitting: Gastroenterology

## 2021-07-19 DIAGNOSIS — R292 Abnormal reflex: Secondary | ICD-10-CM | POA: Diagnosis not present

## 2021-07-19 DIAGNOSIS — M542 Cervicalgia: Secondary | ICD-10-CM | POA: Diagnosis not present

## 2021-07-23 DIAGNOSIS — M542 Cervicalgia: Secondary | ICD-10-CM | POA: Diagnosis not present

## 2021-07-23 DIAGNOSIS — G8929 Other chronic pain: Secondary | ICD-10-CM | POA: Diagnosis not present

## 2021-07-23 DIAGNOSIS — M25561 Pain in right knee: Secondary | ICD-10-CM | POA: Diagnosis not present

## 2021-07-23 DIAGNOSIS — Z79899 Other long term (current) drug therapy: Secondary | ICD-10-CM | POA: Diagnosis not present

## 2021-07-23 DIAGNOSIS — M25562 Pain in left knee: Secondary | ICD-10-CM | POA: Diagnosis not present

## 2021-07-25 DIAGNOSIS — Z79899 Other long term (current) drug therapy: Secondary | ICD-10-CM | POA: Diagnosis not present

## 2021-08-03 ENCOUNTER — Ambulatory Visit: Payer: Medicare Other | Admitting: Neurology

## 2021-08-20 DIAGNOSIS — M25562 Pain in left knee: Secondary | ICD-10-CM | POA: Diagnosis not present

## 2021-08-20 DIAGNOSIS — M5442 Lumbago with sciatica, left side: Secondary | ICD-10-CM | POA: Diagnosis not present

## 2021-08-20 DIAGNOSIS — Z79899 Other long term (current) drug therapy: Secondary | ICD-10-CM | POA: Diagnosis not present

## 2021-08-20 DIAGNOSIS — M5441 Lumbago with sciatica, right side: Secondary | ICD-10-CM | POA: Diagnosis not present

## 2021-08-20 DIAGNOSIS — G8929 Other chronic pain: Secondary | ICD-10-CM | POA: Diagnosis not present

## 2021-08-20 DIAGNOSIS — R110 Nausea: Secondary | ICD-10-CM | POA: Diagnosis not present

## 2021-08-20 DIAGNOSIS — M25561 Pain in right knee: Secondary | ICD-10-CM | POA: Diagnosis not present

## 2021-08-20 DIAGNOSIS — M542 Cervicalgia: Secondary | ICD-10-CM | POA: Diagnosis not present

## 2021-08-20 DIAGNOSIS — K5010 Crohn's disease of large intestine without complications: Secondary | ICD-10-CM | POA: Diagnosis not present

## 2021-08-22 DIAGNOSIS — Z79899 Other long term (current) drug therapy: Secondary | ICD-10-CM | POA: Diagnosis not present

## 2021-09-12 DIAGNOSIS — M25562 Pain in left knee: Secondary | ICD-10-CM | POA: Diagnosis not present

## 2021-09-12 DIAGNOSIS — K5010 Crohn's disease of large intestine without complications: Secondary | ICD-10-CM | POA: Diagnosis not present

## 2021-09-12 DIAGNOSIS — M5442 Lumbago with sciatica, left side: Secondary | ICD-10-CM | POA: Diagnosis not present

## 2021-09-12 DIAGNOSIS — M25561 Pain in right knee: Secondary | ICD-10-CM | POA: Diagnosis not present

## 2021-09-12 DIAGNOSIS — R110 Nausea: Secondary | ICD-10-CM | POA: Diagnosis not present

## 2021-09-12 DIAGNOSIS — M542 Cervicalgia: Secondary | ICD-10-CM | POA: Diagnosis not present

## 2021-09-12 DIAGNOSIS — M5441 Lumbago with sciatica, right side: Secondary | ICD-10-CM | POA: Diagnosis not present

## 2021-09-12 DIAGNOSIS — Z79899 Other long term (current) drug therapy: Secondary | ICD-10-CM | POA: Diagnosis not present

## 2021-09-12 DIAGNOSIS — G8929 Other chronic pain: Secondary | ICD-10-CM | POA: Diagnosis not present

## 2021-09-20 ENCOUNTER — Other Ambulatory Visit: Payer: Self-pay | Admitting: *Deleted

## 2021-09-20 NOTE — Patient Outreach (Signed)
  Care Coordination   09/20/2021  Name: Sheri [name] MRN: [id number] DOB: [birth year]   Care Coordination Outreach Attempts:  An unsuccessful telephone outreach was attempted today to offer the patient information about available care coordination services as a benefit of their health plan. HIPAA compliant messages left on voicemail, providing contact information for CSW, encouraging patient to return CSW's call at her earliest convenience.  Follow Up Plan:  Additional outreach attempts will be made to offer the patient care coordination information and services.   Encounter Outcome:  No Answer.   Care Coordination Interventions Activated:  No    Care Coordination Interventions:  No, not indicated.    Nat Christen, BSW, MSW, LCSW  Licensed Education officer, [facility]  Mailing [place] [address] [place], [place] [address] Physical Address[address] [place], [place] [address] Toll Free Main # [phone] Fax # [phone] Cell # [phone] [email or web address]

## 2021-09-25 ENCOUNTER — Other Ambulatory Visit: Payer: Self-pay | Admitting: *Deleted

## 2021-09-25 NOTE — Patient Outreach (Signed)
  Care Coordination   09/25/2021  Name: AVIAN GREENAWALT MRN: [id number] DOB: 08-14-1950   Care Coordination Outreach Attempts:  A second unsuccessful outreach was attempted today to offer the patient with information about available care coordination services as a benefit of their health plan.   HIPAA compliant message left on voicemail, providing contact information for CSW, encouraging patient to return CSW's call at her earliest convenience.  Follow Up Plan:  Additional outreach attempts will be made to offer the patient care coordination information and services.   Encounter Outcome:  No Answer.   Care Coordination Interventions Activated:  No.    Care Coordination Interventions:  No, not indicated.    Nat Christen, BSW, MSW, LCSW  Licensed Education officer, [facility]  Mailing [place] [address] [place], [place] [address] Physical Address[address] [place], [place] [address] Toll Free Main # [phone] Fax # [phone] Cell # [phone] [email or web address]

## 2021-10-01 ENCOUNTER — Other Ambulatory Visit: Payer: Self-pay | Admitting: *Deleted

## 2021-10-01 NOTE — Patient Outreach (Signed)
  Care Coordination   10/01/2021  Name: LYNNEA VANDERVOORT MRN: [id number] DOB: 1950/05/10   Care Coordination Outreach Attempts:  A third unsuccessful outreach was attempted today to offer the patient with information about available care coordination services as a benefit of their health plan. HIPAA compliant message left on voicemail, providing contact information for CSW, encouraging patient to return CSW's call at her earliest convenience.  Follow Up Plan:  No further outreach attempts will be made at this time. We have been unable to contact the patient to offer or enroll patient in care coordination services.  Encounter Outcome:  No Answer.   Care Coordination Interventions Activated:  No.    Care Coordination Interventions:  No, not indicated.    Nat Christen, BSW, MSW, LCSW  Licensed Education officer, environmental [facility]  Mailing [place] [address], [place], [place] [address] Physical Address[address] [place], [place] [address] Toll Free Main # [phone] Fax # [phone] Cell # [phone] [email or web address]

## 2021-10-10 DIAGNOSIS — M5442 Lumbago with sciatica, left side: Secondary | ICD-10-CM | POA: Diagnosis not present

## 2021-10-10 DIAGNOSIS — M25561 Pain in right knee: Secondary | ICD-10-CM | POA: Diagnosis not present

## 2021-10-10 DIAGNOSIS — Z79899 Other long term (current) drug therapy: Secondary | ICD-10-CM | POA: Diagnosis not present

## 2021-10-10 DIAGNOSIS — K5010 Crohn's disease of large intestine without complications: Secondary | ICD-10-CM | POA: Diagnosis not present

## 2021-10-10 DIAGNOSIS — M5441 Lumbago with sciatica, right side: Secondary | ICD-10-CM | POA: Diagnosis not present

## 2021-10-10 DIAGNOSIS — R110 Nausea: Secondary | ICD-10-CM | POA: Diagnosis not present

## 2021-10-10 DIAGNOSIS — M542 Cervicalgia: Secondary | ICD-10-CM | POA: Diagnosis not present

## 2021-10-10 DIAGNOSIS — M25562 Pain in left knee: Secondary | ICD-10-CM | POA: Diagnosis not present

## 2021-10-10 DIAGNOSIS — G8929 Other chronic pain: Secondary | ICD-10-CM | POA: Diagnosis not present

## 2021-10-25 DIAGNOSIS — D485 Neoplasm of uncertain behavior of skin: Secondary | ICD-10-CM | POA: Diagnosis not present

## 2021-10-25 DIAGNOSIS — L570 Actinic keratosis: Secondary | ICD-10-CM | POA: Diagnosis not present

## 2021-10-25 DIAGNOSIS — C44729 Squamous cell carcinoma of skin of left lower limb, including hip: Secondary | ICD-10-CM | POA: Diagnosis not present

## 2021-11-06 DIAGNOSIS — M542 Cervicalgia: Secondary | ICD-10-CM | POA: Diagnosis not present

## 2021-11-06 DIAGNOSIS — G8929 Other chronic pain: Secondary | ICD-10-CM | POA: Diagnosis not present

## 2021-11-06 DIAGNOSIS — M25562 Pain in left knee: Secondary | ICD-10-CM | POA: Diagnosis not present

## 2021-11-06 DIAGNOSIS — K5010 Crohn's disease of large intestine without complications: Secondary | ICD-10-CM | POA: Diagnosis not present

## 2021-11-06 DIAGNOSIS — M5441 Lumbago with sciatica, right side: Secondary | ICD-10-CM | POA: Diagnosis not present

## 2021-11-06 DIAGNOSIS — R110 Nausea: Secondary | ICD-10-CM | POA: Diagnosis not present

## 2021-11-06 DIAGNOSIS — M5442 Lumbago with sciatica, left side: Secondary | ICD-10-CM | POA: Diagnosis not present

## 2021-11-06 DIAGNOSIS — M25561 Pain in right knee: Secondary | ICD-10-CM | POA: Diagnosis not present

## 2021-11-06 DIAGNOSIS — Z79899 Other long term (current) drug therapy: Secondary | ICD-10-CM | POA: Diagnosis not present

## 2021-11-08 DIAGNOSIS — L570 Actinic keratosis: Secondary | ICD-10-CM | POA: Diagnosis not present

## 2021-11-08 DIAGNOSIS — C44729 Squamous cell carcinoma of skin of left lower limb, including hip: Secondary | ICD-10-CM | POA: Diagnosis not present

## 2021-11-09 DIAGNOSIS — Z79899 Other long term (current) drug therapy: Secondary | ICD-10-CM | POA: Diagnosis not present

## 2021-11-14 DIAGNOSIS — E538 Deficiency of other specified B group vitamins: Secondary | ICD-10-CM | POA: Diagnosis not present

## 2021-11-14 DIAGNOSIS — Z85828 Personal history of other malignant neoplasm of skin: Secondary | ICD-10-CM | POA: Diagnosis not present

## 2021-11-14 DIAGNOSIS — R140 Abdominal distension (gaseous): Secondary | ICD-10-CM | POA: Diagnosis not present

## 2021-11-14 DIAGNOSIS — Z79899 Other long term (current) drug therapy: Secondary | ICD-10-CM | POA: Diagnosis not present

## 2021-11-14 DIAGNOSIS — K50019 Crohn's disease of small intestine with unspecified complications: Secondary | ICD-10-CM | POA: Diagnosis not present

## 2021-11-29 DIAGNOSIS — R160 Hepatomegaly, not elsewhere classified: Secondary | ICD-10-CM | POA: Diagnosis not present

## 2021-11-29 DIAGNOSIS — K760 Fatty (change of) liver, not elsewhere classified: Secondary | ICD-10-CM | POA: Diagnosis not present

## 2021-11-30 DIAGNOSIS — R300 Dysuria: Secondary | ICD-10-CM | POA: Diagnosis not present

## 2021-11-30 DIAGNOSIS — K5010 Crohn's disease of large intestine without complications: Secondary | ICD-10-CM | POA: Diagnosis not present

## 2021-11-30 DIAGNOSIS — I10 Essential (primary) hypertension: Secondary | ICD-10-CM | POA: Diagnosis not present

## 2021-11-30 DIAGNOSIS — Z299 Encounter for prophylactic measures, unspecified: Secondary | ICD-10-CM | POA: Diagnosis not present

## 2021-11-30 DIAGNOSIS — M545 Low back pain, unspecified: Secondary | ICD-10-CM | POA: Diagnosis not present

## 2021-11-30 DIAGNOSIS — K219 Gastro-esophageal reflux disease without esophagitis: Secondary | ICD-10-CM | POA: Diagnosis not present

## 2021-12-04 DIAGNOSIS — K5010 Crohn's disease of large intestine without complications: Secondary | ICD-10-CM | POA: Diagnosis not present

## 2021-12-04 DIAGNOSIS — M25562 Pain in left knee: Secondary | ICD-10-CM | POA: Diagnosis not present

## 2021-12-04 DIAGNOSIS — G8929 Other chronic pain: Secondary | ICD-10-CM | POA: Diagnosis not present

## 2021-12-04 DIAGNOSIS — M542 Cervicalgia: Secondary | ICD-10-CM | POA: Diagnosis not present

## 2021-12-04 DIAGNOSIS — Z79899 Other long term (current) drug therapy: Secondary | ICD-10-CM | POA: Diagnosis not present

## 2021-12-04 DIAGNOSIS — R110 Nausea: Secondary | ICD-10-CM | POA: Diagnosis not present

## 2021-12-04 DIAGNOSIS — M5441 Lumbago with sciatica, right side: Secondary | ICD-10-CM | POA: Diagnosis not present

## 2021-12-04 DIAGNOSIS — M25561 Pain in right knee: Secondary | ICD-10-CM | POA: Diagnosis not present

## 2021-12-04 DIAGNOSIS — M5442 Lumbago with sciatica, left side: Secondary | ICD-10-CM | POA: Diagnosis not present

## 2021-12-05 DIAGNOSIS — Z79899 Other long term (current) drug therapy: Secondary | ICD-10-CM | POA: Diagnosis not present

## 2021-12-13 DIAGNOSIS — L570 Actinic keratosis: Secondary | ICD-10-CM | POA: Diagnosis not present

## 2021-12-13 DIAGNOSIS — D485 Neoplasm of uncertain behavior of skin: Secondary | ICD-10-CM | POA: Diagnosis not present

## 2021-12-13 DIAGNOSIS — C44722 Squamous cell carcinoma of skin of right lower limb, including hip: Secondary | ICD-10-CM | POA: Diagnosis not present

## 2021-12-24 ENCOUNTER — Ambulatory Visit: Payer: Medicare Other | Admitting: Neurology

## 2021-12-27 DIAGNOSIS — C44722 Squamous cell carcinoma of skin of right lower limb, including hip: Secondary | ICD-10-CM | POA: Diagnosis not present

## 2021-12-31 DIAGNOSIS — M545 Low back pain, unspecified: Secondary | ICD-10-CM | POA: Diagnosis not present

## 2021-12-31 DIAGNOSIS — I10 Essential (primary) hypertension: Secondary | ICD-10-CM | POA: Diagnosis not present

## 2021-12-31 DIAGNOSIS — Z299 Encounter for prophylactic measures, unspecified: Secondary | ICD-10-CM | POA: Diagnosis not present

## 2021-12-31 DIAGNOSIS — Z713 Dietary counseling and surveillance: Secondary | ICD-10-CM | POA: Diagnosis not present

## 2021-12-31 DIAGNOSIS — R300 Dysuria: Secondary | ICD-10-CM | POA: Diagnosis not present

## 2022-01-01 DIAGNOSIS — R110 Nausea: Secondary | ICD-10-CM | POA: Diagnosis not present

## 2022-01-01 DIAGNOSIS — M25561 Pain in right knee: Secondary | ICD-10-CM | POA: Diagnosis not present

## 2022-01-01 DIAGNOSIS — Z79899 Other long term (current) drug therapy: Secondary | ICD-10-CM | POA: Diagnosis not present

## 2022-01-01 DIAGNOSIS — K5010 Crohn's disease of large intestine without complications: Secondary | ICD-10-CM | POA: Diagnosis not present

## 2022-01-01 DIAGNOSIS — M5441 Lumbago with sciatica, right side: Secondary | ICD-10-CM | POA: Diagnosis not present

## 2022-01-01 DIAGNOSIS — M542 Cervicalgia: Secondary | ICD-10-CM | POA: Diagnosis not present

## 2022-01-01 DIAGNOSIS — G8929 Other chronic pain: Secondary | ICD-10-CM | POA: Diagnosis not present

## 2022-01-01 DIAGNOSIS — M25562 Pain in left knee: Secondary | ICD-10-CM | POA: Diagnosis not present

## 2022-01-01 DIAGNOSIS — M5442 Lumbago with sciatica, left side: Secondary | ICD-10-CM | POA: Diagnosis not present

## 2022-01-10 DIAGNOSIS — C44729 Squamous cell carcinoma of skin of left lower limb, including hip: Secondary | ICD-10-CM | POA: Diagnosis not present

## 2022-01-15 DIAGNOSIS — Z7189 Other specified counseling: Secondary | ICD-10-CM | POA: Diagnosis not present

## 2022-01-15 DIAGNOSIS — I10 Essential (primary) hypertension: Secondary | ICD-10-CM | POA: Diagnosis not present

## 2022-01-15 DIAGNOSIS — Z0000 Encounter for general adult medical examination without abnormal findings: Secondary | ICD-10-CM | POA: Diagnosis not present

## 2022-01-15 DIAGNOSIS — K5010 Crohn's disease of large intestine without complications: Secondary | ICD-10-CM | POA: Diagnosis not present

## 2022-01-15 DIAGNOSIS — I700 Atherosclerosis of aorta: Secondary | ICD-10-CM | POA: Diagnosis not present

## 2022-01-15 DIAGNOSIS — Z299 Encounter for prophylactic measures, unspecified: Secondary | ICD-10-CM | POA: Diagnosis not present

## 2022-01-22 DIAGNOSIS — K648 Other hemorrhoids: Secondary | ICD-10-CM | POA: Diagnosis not present

## 2022-01-22 DIAGNOSIS — K644 Residual hemorrhoidal skin tags: Secondary | ICD-10-CM | POA: Diagnosis not present

## 2022-01-22 DIAGNOSIS — K50019 Crohn's disease of small intestine with unspecified complications: Secondary | ICD-10-CM | POA: Diagnosis not present

## 2022-01-22 DIAGNOSIS — K50012 Crohn's disease of small intestine with intestinal obstruction: Secondary | ICD-10-CM | POA: Diagnosis not present

## 2022-01-22 DIAGNOSIS — Z980 Intestinal bypass and anastomosis status: Secondary | ICD-10-CM | POA: Diagnosis not present

## 2022-01-22 DIAGNOSIS — K6389 Other specified diseases of intestine: Secondary | ICD-10-CM | POA: Diagnosis not present

## 2022-01-24 DIAGNOSIS — L570 Actinic keratosis: Secondary | ICD-10-CM | POA: Diagnosis not present

## 2022-01-24 DIAGNOSIS — D485 Neoplasm of uncertain behavior of skin: Secondary | ICD-10-CM | POA: Diagnosis not present

## 2022-01-24 DIAGNOSIS — C44629 Squamous cell carcinoma of skin of left upper limb, including shoulder: Secondary | ICD-10-CM | POA: Diagnosis not present

## 2022-01-29 DIAGNOSIS — Z79899 Other long term (current) drug therapy: Secondary | ICD-10-CM | POA: Diagnosis not present

## 2022-01-29 DIAGNOSIS — R5383 Other fatigue: Secondary | ICD-10-CM | POA: Diagnosis not present

## 2022-01-29 DIAGNOSIS — K120 Recurrent oral aphthae: Secondary | ICD-10-CM | POA: Diagnosis not present

## 2022-01-29 DIAGNOSIS — Z0000 Encounter for general adult medical examination without abnormal findings: Secondary | ICD-10-CM | POA: Diagnosis not present

## 2022-01-29 DIAGNOSIS — I10 Essential (primary) hypertension: Secondary | ICD-10-CM | POA: Diagnosis not present

## 2022-01-29 DIAGNOSIS — Z299 Encounter for prophylactic measures, unspecified: Secondary | ICD-10-CM | POA: Diagnosis not present

## 2022-01-29 DIAGNOSIS — E7800 Pure hypercholesterolemia, unspecified: Secondary | ICD-10-CM | POA: Diagnosis not present

## 2022-01-30 DIAGNOSIS — M5442 Lumbago with sciatica, left side: Secondary | ICD-10-CM | POA: Diagnosis not present

## 2022-01-30 DIAGNOSIS — R110 Nausea: Secondary | ICD-10-CM | POA: Diagnosis not present

## 2022-01-30 DIAGNOSIS — M25561 Pain in right knee: Secondary | ICD-10-CM | POA: Diagnosis not present

## 2022-01-30 DIAGNOSIS — R030 Elevated blood-pressure reading, without diagnosis of hypertension: Secondary | ICD-10-CM | POA: Diagnosis not present

## 2022-01-30 DIAGNOSIS — Z79899 Other long term (current) drug therapy: Secondary | ICD-10-CM | POA: Diagnosis not present

## 2022-01-30 DIAGNOSIS — M542 Cervicalgia: Secondary | ICD-10-CM | POA: Diagnosis not present

## 2022-01-30 DIAGNOSIS — G8929 Other chronic pain: Secondary | ICD-10-CM | POA: Diagnosis not present

## 2022-01-30 DIAGNOSIS — M5441 Lumbago with sciatica, right side: Secondary | ICD-10-CM | POA: Diagnosis not present

## 2022-01-30 DIAGNOSIS — K5010 Crohn's disease of large intestine without complications: Secondary | ICD-10-CM | POA: Diagnosis not present

## 2022-01-30 DIAGNOSIS — M25562 Pain in left knee: Secondary | ICD-10-CM | POA: Diagnosis not present

## 2022-01-31 DIAGNOSIS — Z79899 Other long term (current) drug therapy: Secondary | ICD-10-CM | POA: Diagnosis not present

## 2022-02-01 ENCOUNTER — Other Ambulatory Visit: Payer: Self-pay

## 2022-02-01 DIAGNOSIS — Z1231 Encounter for screening mammogram for malignant neoplasm of breast: Secondary | ICD-10-CM

## 2022-02-06 DIAGNOSIS — I10 Essential (primary) hypertension: Secondary | ICD-10-CM | POA: Diagnosis not present

## 2022-02-06 DIAGNOSIS — N390 Urinary tract infection, site not specified: Secondary | ICD-10-CM | POA: Diagnosis not present

## 2022-02-19 ENCOUNTER — Inpatient Hospital Stay: Admission: RE | Admit: 2022-02-19 | Payer: 59 | Source: Ambulatory Visit

## 2022-02-24 ENCOUNTER — Other Ambulatory Visit: Payer: Self-pay | Admitting: Gastroenterology

## 2022-02-24 DIAGNOSIS — K219 Gastro-esophageal reflux disease without esophagitis: Secondary | ICD-10-CM

## 2022-02-26 DIAGNOSIS — K5010 Crohn's disease of large intestine without complications: Secondary | ICD-10-CM | POA: Diagnosis not present

## 2022-02-26 DIAGNOSIS — N390 Urinary tract infection, site not specified: Secondary | ICD-10-CM | POA: Diagnosis not present

## 2022-02-26 DIAGNOSIS — R300 Dysuria: Secondary | ICD-10-CM | POA: Diagnosis not present

## 2022-02-26 DIAGNOSIS — G8929 Other chronic pain: Secondary | ICD-10-CM | POA: Diagnosis not present

## 2022-02-26 DIAGNOSIS — M542 Cervicalgia: Secondary | ICD-10-CM | POA: Diagnosis not present

## 2022-02-26 DIAGNOSIS — M5442 Lumbago with sciatica, left side: Secondary | ICD-10-CM | POA: Diagnosis not present

## 2022-02-26 DIAGNOSIS — R110 Nausea: Secondary | ICD-10-CM | POA: Diagnosis not present

## 2022-02-26 DIAGNOSIS — M25562 Pain in left knee: Secondary | ICD-10-CM | POA: Diagnosis not present

## 2022-02-26 DIAGNOSIS — M5441 Lumbago with sciatica, right side: Secondary | ICD-10-CM | POA: Diagnosis not present

## 2022-02-26 DIAGNOSIS — Z79899 Other long term (current) drug therapy: Secondary | ICD-10-CM | POA: Diagnosis not present

## 2022-02-26 DIAGNOSIS — M25561 Pain in right knee: Secondary | ICD-10-CM | POA: Diagnosis not present

## 2022-03-11 DIAGNOSIS — M62830 Muscle spasm of back: Secondary | ICD-10-CM | POA: Diagnosis not present

## 2022-03-11 DIAGNOSIS — K5010 Crohn's disease of large intestine without complications: Secondary | ICD-10-CM | POA: Diagnosis not present

## 2022-03-11 DIAGNOSIS — I700 Atherosclerosis of aorta: Secondary | ICD-10-CM | POA: Diagnosis not present

## 2022-03-28 DIAGNOSIS — L570 Actinic keratosis: Secondary | ICD-10-CM | POA: Diagnosis not present

## 2022-03-28 DIAGNOSIS — C44722 Squamous cell carcinoma of skin of right lower limb, including hip: Secondary | ICD-10-CM | POA: Diagnosis not present

## 2022-03-28 DIAGNOSIS — D485 Neoplasm of uncertain behavior of skin: Secondary | ICD-10-CM | POA: Diagnosis not present

## 2022-04-11 DIAGNOSIS — C44722 Squamous cell carcinoma of skin of right lower limb, including hip: Secondary | ICD-10-CM | POA: Diagnosis not present

## 2022-04-11 DIAGNOSIS — L570 Actinic keratosis: Secondary | ICD-10-CM | POA: Diagnosis not present

## 2022-04-18 DIAGNOSIS — M25512 Pain in left shoulder: Secondary | ICD-10-CM | POA: Diagnosis not present

## 2022-04-18 DIAGNOSIS — R5383 Other fatigue: Secondary | ICD-10-CM | POA: Diagnosis not present

## 2022-05-02 DIAGNOSIS — I700 Atherosclerosis of aorta: Secondary | ICD-10-CM | POA: Diagnosis not present

## 2022-05-02 DIAGNOSIS — I10 Essential (primary) hypertension: Secondary | ICD-10-CM | POA: Diagnosis not present

## 2022-05-02 DIAGNOSIS — E559 Vitamin D deficiency, unspecified: Secondary | ICD-10-CM | POA: Diagnosis not present

## 2022-05-02 DIAGNOSIS — K5010 Crohn's disease of large intestine without complications: Secondary | ICD-10-CM | POA: Diagnosis not present

## 2022-05-02 DIAGNOSIS — E538 Deficiency of other specified B group vitamins: Secondary | ICD-10-CM | POA: Diagnosis not present

## 2022-05-02 DIAGNOSIS — Z299 Encounter for prophylactic measures, unspecified: Secondary | ICD-10-CM | POA: Diagnosis not present

## 2022-05-14 DIAGNOSIS — I10 Essential (primary) hypertension: Secondary | ICD-10-CM | POA: Diagnosis not present

## 2022-05-14 DIAGNOSIS — I700 Atherosclerosis of aorta: Secondary | ICD-10-CM | POA: Diagnosis not present

## 2022-05-14 DIAGNOSIS — Z299 Encounter for prophylactic measures, unspecified: Secondary | ICD-10-CM | POA: Diagnosis not present

## 2022-05-27 ENCOUNTER — Other Ambulatory Visit: Payer: Self-pay | Admitting: Gastroenterology

## 2022-05-27 ENCOUNTER — Other Ambulatory Visit: Payer: Self-pay | Admitting: Neurology

## 2022-05-27 DIAGNOSIS — K219 Gastro-esophageal reflux disease without esophagitis: Secondary | ICD-10-CM

## 2022-06-03 IMAGING — MR MR CERVICAL SPINE W/O CM
5 series · 38 of 48 positions shown · non-contrast
Comparison: Cervical spine MRI 09/03/2017

CLINICAL DATA: Chronic left greater than right neck pain.
Hyperreflexia. Numbness and weakness in the hands/fingers.

EXAM:
MRI CERVICAL SPINE WITHOUT CONTRAST
TECHNIQUE: Multiplanar, multisequence MR imaging of the cervical spine was
performed. No intravenous contrast was administered.

[Series 2: T2 · sagittal · 3.0mm · 0.41mm/px · 8 of 20 slices shown (1 of 2)]
[im 1/20]
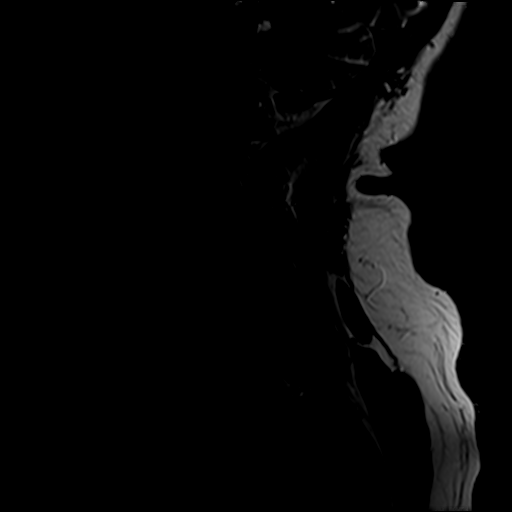
[im 3/20]
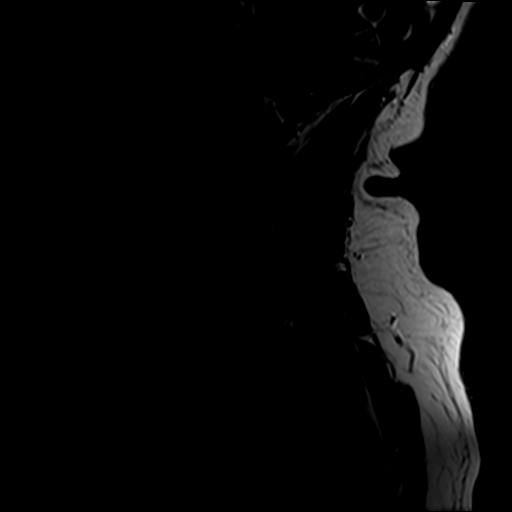
[im 6/20]
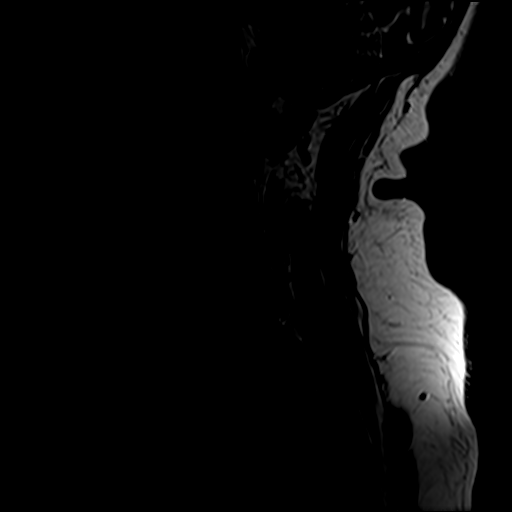
[im 9/20]
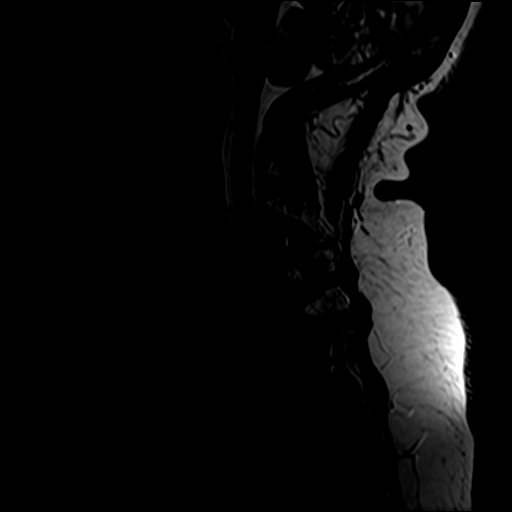
[im 11/20]
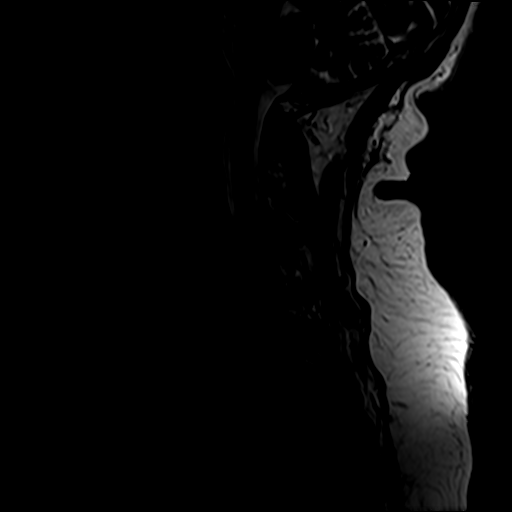
[im 14/20]
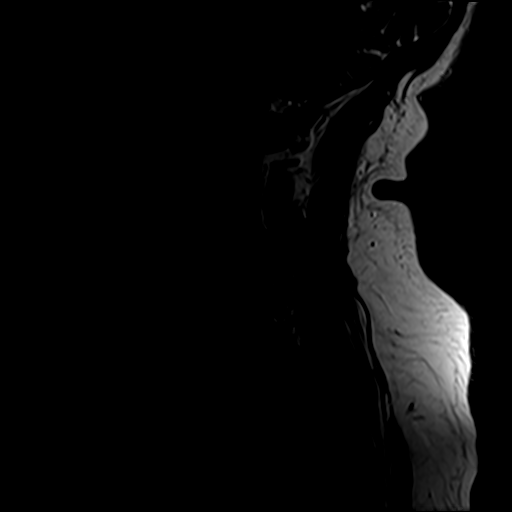
[im 17/20]
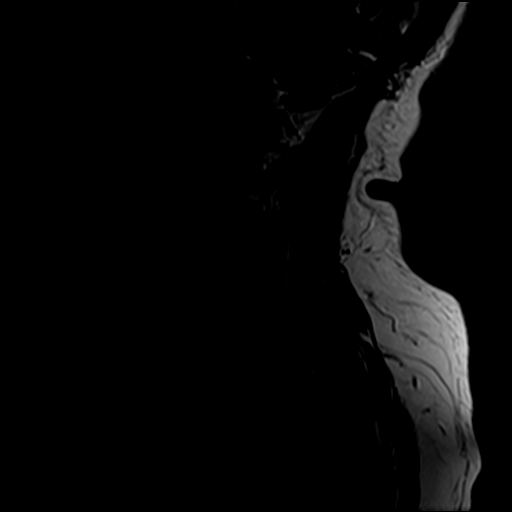
[im 20/20]
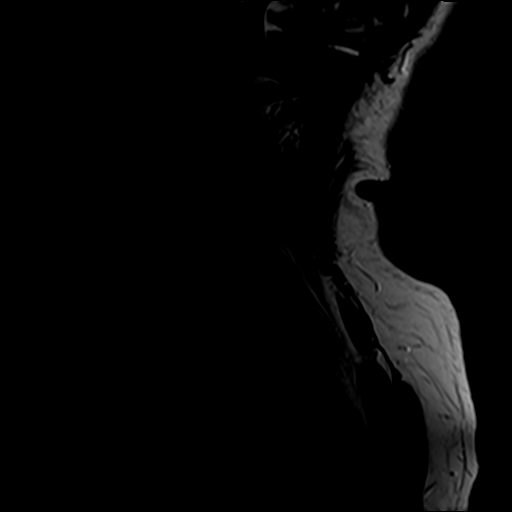

[Series 3: STIR · sagittal · 3.0mm · 0.82mm/px · 9 of 20 slices shown]
[im 1/20]
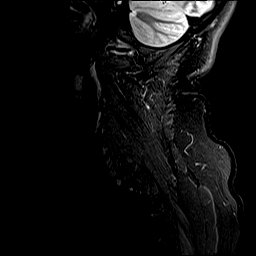
[im 3/20]
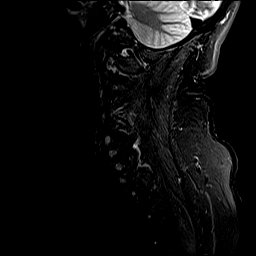
[im 5/20]
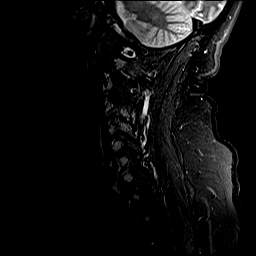
[im 8/20]
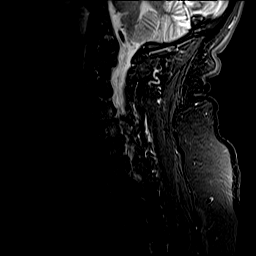
[im 10/20]
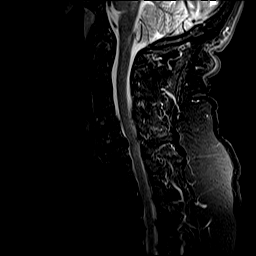
[im 12/20]
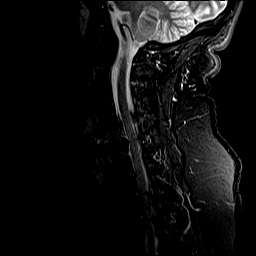
[im 15/20]
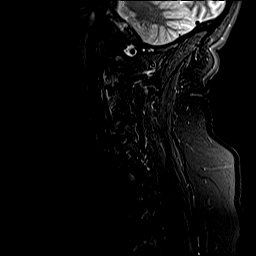
[im 17/20]
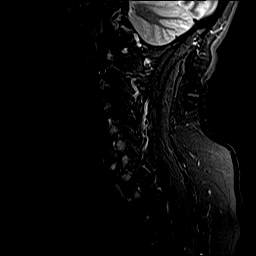
[im 20/20]
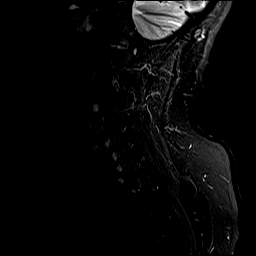

[Series 4: T1 · sagittal · 3.0mm · 0.82mm/px · 9 of 20 slices shown]
[im 1/20]
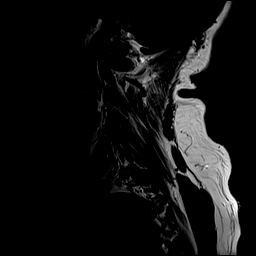
[im 3/20]
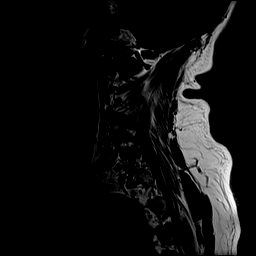
[im 5/20]
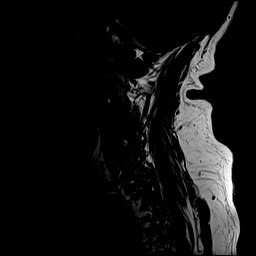
[im 8/20]
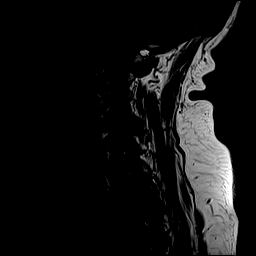
[im 10/20]
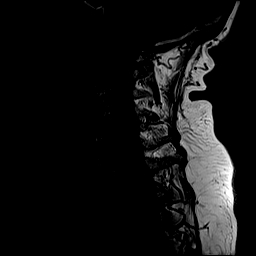
[im 12/20]
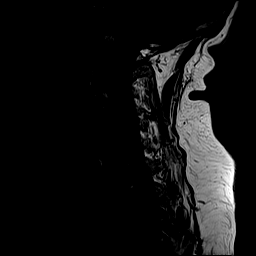
[im 15/20]
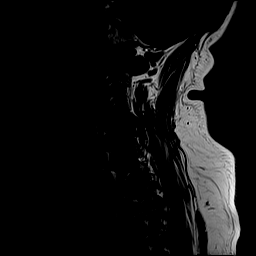
[im 17/20]
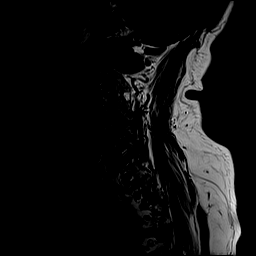
[im 20/20]
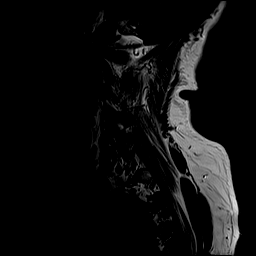

[Series 5: T2 · axial · 3.0mm · 0.70mm/px · z∈[-109,-21]mm · 11 of 26 slices shown (2 of 2)]
[im 1/26]
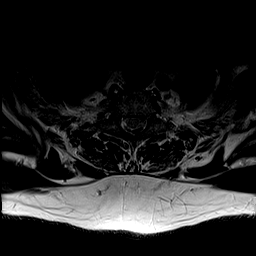
[im 3/26]
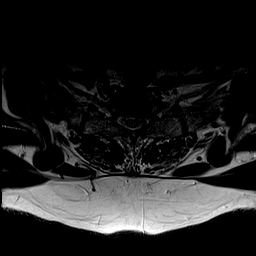
[im 6/26]
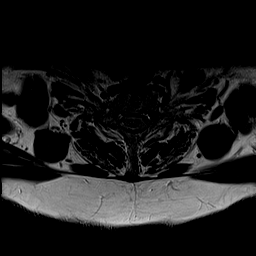
[im 8/26]
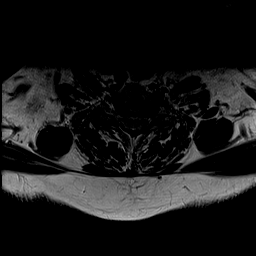
[im 11/26]
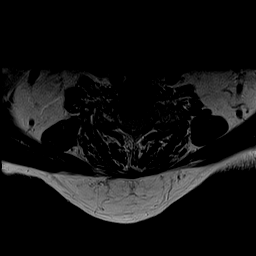
[im 13/26]
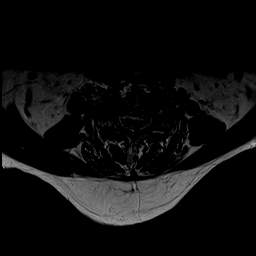
[im 16/26]
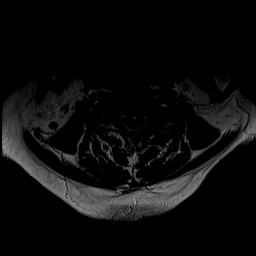
[im 18/26]
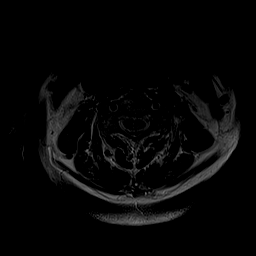
[im 21/26]
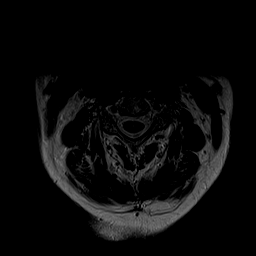
[im 23/26]
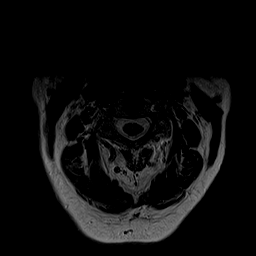
[im 26/26]
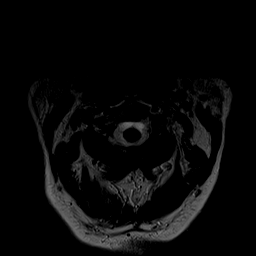

[Series 6: GRE · axial · 3.0mm · 0.35mm/px · 1 of 26 slices shown]
[im 1/26]
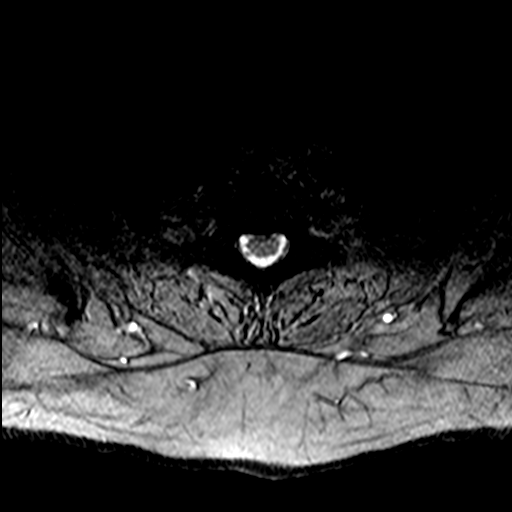

[38 of 48 positions shown; findings below may reference images not displayed]

FINDINGS: Alignment: Chronic straightening of the normal cervical lordosis.
Unchanged trace anterolisthesis of C4 on C5 and C7 on T1.

Vertebrae: No fracture or suspicious marrow lesion. New mild
degenerative endplate edema at C4-5 and C5-6. New moderate
right-sided facet edema at C4-5, also likely degenerative. Facet
edema at T1-2 on the prior MRI has resolved.

Cord: Normal signal.

Posterior Fossa, vertebral arteries, paraspinal tissues:
Unremarkable.

Disc levels:

C2-3: Unchanged interbody and facet ankylosis.  No stenosis.

C3-4: Unchanged interbody and facet ankylosis.  No stenosis.

C4-5: Leftward disc bulging, uncovertebral spurring, and severe
facet arthrosis result in mild-to-moderate right and moderate to
severe left neural foraminal stenosis without spinal stenosis. Right
facet arthrosis has progressed, and right neural foraminal stenosis
is new.

C5-6: Moderate disc space narrowing. Increased disc bulging/broad
central disc protrusion, uncovertebral spurring, and
mild-to-moderate facet arthrosis result in increased,
mild-to-moderate spinal stenosis and moderate to severe bilateral
neural foraminal stenosis.

C6-7: Moderate disc space narrowing. Disc bulging, uncovertebral
spurring, and mild-to-moderate right facet arthrosis result in mild
spinal stenosis and mild right and moderate to severe left neural
foraminal stenosis, unchanged.

C7-T1: Severe facet arthrosis with interval facet ankylosis
bilaterally. Anterolisthesis with disc uncovering, uncovertebral
spurring, and facet arthrosis result in mild bilateral neural
foraminal stenosis without spinal stenosis.

T1-2: Only imaged sagittally. Disc bulging and severe facet
arthrosis result in moderate to severe right and mild-to-moderate
left neural foraminal stenosis and at most mild spinal stenosis,
unchanged.
IMPRESSION: 1. Progressive, severe right facet arthrosis at C4-5 with facet
edema and new mild-to-moderate right neural foraminal stenosis.
Chronic moderate to severe left neural foraminal stenosis.
2. Progressive, mild-to-moderate spinal stenosis and moderate to
severe bilateral neural foraminal stenosis at C5-6.
3. Unchanged mild spinal stenosis and moderate to severe left neural
foraminal stenosis at C6-7.
4. Unchanged moderate to severe right neural foraminal stenosis at
T1-2.

## 2022-06-27 DIAGNOSIS — L570 Actinic keratosis: Secondary | ICD-10-CM | POA: Diagnosis not present

## 2022-06-27 DIAGNOSIS — D485 Neoplasm of uncertain behavior of skin: Secondary | ICD-10-CM | POA: Diagnosis not present

## 2022-06-27 DIAGNOSIS — C44722 Squamous cell carcinoma of skin of right lower limb, including hip: Secondary | ICD-10-CM | POA: Diagnosis not present

## 2022-06-27 DIAGNOSIS — L853 Xerosis cutis: Secondary | ICD-10-CM | POA: Diagnosis not present

## 2022-06-27 DIAGNOSIS — N390 Urinary tract infection, site not specified: Secondary | ICD-10-CM | POA: Diagnosis not present

## 2022-06-27 DIAGNOSIS — R52 Pain, unspecified: Secondary | ICD-10-CM | POA: Diagnosis not present

## 2022-06-27 DIAGNOSIS — Z299 Encounter for prophylactic measures, unspecified: Secondary | ICD-10-CM | POA: Diagnosis not present

## 2022-06-27 DIAGNOSIS — G5603 Carpal tunnel syndrome, bilateral upper limbs: Secondary | ICD-10-CM | POA: Diagnosis not present

## 2022-06-27 DIAGNOSIS — C44729 Squamous cell carcinoma of skin of left lower limb, including hip: Secondary | ICD-10-CM | POA: Diagnosis not present

## 2022-06-27 DIAGNOSIS — I10 Essential (primary) hypertension: Secondary | ICD-10-CM | POA: Diagnosis not present

## 2022-07-04 DIAGNOSIS — E538 Deficiency of other specified B group vitamins: Secondary | ICD-10-CM | POA: Diagnosis not present

## 2022-07-04 DIAGNOSIS — I10 Essential (primary) hypertension: Secondary | ICD-10-CM | POA: Diagnosis not present

## 2022-07-04 DIAGNOSIS — N390 Urinary tract infection, site not specified: Secondary | ICD-10-CM | POA: Diagnosis not present

## 2022-07-04 DIAGNOSIS — Z299 Encounter for prophylactic measures, unspecified: Secondary | ICD-10-CM | POA: Diagnosis not present

## 2022-07-05 DIAGNOSIS — N390 Urinary tract infection, site not specified: Secondary | ICD-10-CM | POA: Diagnosis not present

## 2022-07-12 ENCOUNTER — Ambulatory Visit (INDEPENDENT_AMBULATORY_CARE_PROVIDER_SITE_OTHER): Payer: 59 | Admitting: Neurology

## 2022-07-12 ENCOUNTER — Encounter: Payer: Self-pay | Admitting: Neurology

## 2022-07-12 VITALS — BP 160/64 | HR 64 | Ht 69.0 in | Wt 217.2 lb

## 2022-07-12 DIAGNOSIS — R202 Paresthesia of skin: Secondary | ICD-10-CM

## 2022-07-12 DIAGNOSIS — G629 Polyneuropathy, unspecified: Secondary | ICD-10-CM

## 2022-07-12 DIAGNOSIS — R200 Anesthesia of skin: Secondary | ICD-10-CM

## 2022-07-12 DIAGNOSIS — M79604 Pain in right leg: Secondary | ICD-10-CM | POA: Diagnosis not present

## 2022-07-12 MED ORDER — GABAPENTIN 400 MG PO CAPS: ORAL_CAPSULE | ORAL | 11 refills | Status: DC
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
## 2022-07-12 NOTE — Patient Instructions (Signed)
Always good to see you.  Schedule EMG/NCV of the right arm and leg  2. Continue Gabapentin 400mg : take 2 capsules in AM, 3 capsules in PM  3. Follow-up in 6 months, call for any changes

## 2022-07-18 DIAGNOSIS — C44729 Squamous cell carcinoma of skin of left lower limb, including hip: Secondary | ICD-10-CM | POA: Diagnosis not present

## 2022-07-19 ENCOUNTER — Telehealth: Payer: Self-pay

## 2022-07-19 ENCOUNTER — Ambulatory Visit (INDEPENDENT_AMBULATORY_CARE_PROVIDER_SITE_OTHER): Payer: 59 | Admitting: Neurology

## 2022-07-19 DIAGNOSIS — G5601 Carpal tunnel syndrome, right upper limb: Secondary | ICD-10-CM

## 2022-07-19 DIAGNOSIS — G629 Polyneuropathy, unspecified: Secondary | ICD-10-CM | POA: Diagnosis not present

## 2022-07-19 DIAGNOSIS — M5412 Radiculopathy, cervical region: Secondary | ICD-10-CM

## 2022-07-19 DIAGNOSIS — M79604 Pain in right leg: Secondary | ICD-10-CM

## 2022-07-19 NOTE — Procedures (Signed)
[facility]  [address] [place], [address]  [place], [place] [address] Tel: [phone] Fax: [phone] Test Date:  07/19/2022  Patient: Sheri [name] DOB: 08/19/1950 Physician: Nita Sickle, DO  Sex: Female Height: 5\' 9"  Ref Phys: Patrcia Dolly, MD  ID#: [id number]   Technician:    History: This is a 72 year old female referred for evaluation of right hand and leg pain.  NCV & EMG Findings: Extensive electrodiagnostic testing of the right upper and lower extremity shows:  Right median sensory response shows prolonged latency (4.5 ms).  Right ulnar, sural, and superficial peroneal sensory responses are within normal limits. Right median motor response shows prolonged latency (4.3 ms); of note, there is a right Martin-Gruber anastomosis, a normal anatomic variant.  Right ulnar and peroneal motor responses are within normal limits.  Right tibial motor response shows reduced amplitude (2.0 mV).   Right tibial H reflex study shows prolonged latency.   In the right upper extremity, chronic motor axon loss changes are seen affecting the C5-6 myotome, without accompanying active denervation.   In the right lower extremity, chronic motor axon loss changes are seen affecting the L5-S1 myotome, without accompanying active denervation.     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 Chronic L5-S1 radiculopathy affecting the right lower extremity, moderate. Right median neuropathy at or distal to the wrist, consistent with a clinical diagnosis of carpal tunnel syndrome.  Overall, these findings are moderate in degree electrically. There is no evidence of a large fiber sensorimotor polyneuropathy affecting the right side.   ___________________________ Nita Sickle, DO    Nerve Conduction Studies   Stim Site NR Peak (ms) Norm Peak (ms) O-P Amp (V) Norm O-P Amp  Right Median Anti Sensory (2nd Digit)  32 C  Wrist    *4.5 <3.8 13.9 >10  Right  Sup Peroneal Anti Sensory (Ant Lat Mall)  32 C  12 cm    2.0 <4.6 3.7 >3  Right Sural Anti Sensory (Lat Mall)  32 C  Calf    3.1 <4.6 3.7 >3  Right Ulnar Anti Sensory (5th Digit)  32 C  Wrist    3.0 <3.2 23.0 >5     Stim Site NR Onset (ms) Norm Onset (ms) O-P Amp (mV) Norm O-P Amp Site1 Site2 Delta-0 (ms) Dist (cm) Vel (m/s) Norm Vel (m/s)  Right Median Motor (Abd Poll Brev)  32 C  Wrist    *4.3 <4.0 8.0 >5 Elbow Wrist 5.6 28.0 50 >50  Elbow    9.9  8.3  Ulnar-wrist crossover Elbow 5.4 0.0    Ulnar-wrist crossover    4.5  5.3         Right Peroneal Motor (Ext Dig Brev)  32 C  Ankle    2.7 <6.0 2.7 >2.5 B Fib Ankle 9.3 38.0 41 >40  B Fib    12.0  2.1  Poplt B Fib 1.5 8.0 53 >40  Poplt    13.5  2.1         Right Tibial Motor (Abd Hall Brev)  32 C  Ankle    3.5 <6.0 *2.0 >4 Knee Ankle 10.0 44.0 44 >40  Knee    13.5  1.8         Right Ulnar Motor (Abd Dig Minimi)  32 C  Wrist    2.4 <3.1 10.3 >7 B Elbow Wrist 3.6 20.0 56 >50  B Elbow    6.0  9.3  A Elbow B Elbow 1.7  10.0 59 >50  A Elbow    7.7  9.1          Electromyography   Side Muscle Ins.Act Fibs Fasc Recrt Amp Dur Poly Activation Comment  Right 1stDorInt Nml Nml Nml Nml Nml Nml Nml Nml N/A  Right Abd Poll Brev Nml Nml Nml Nml Nml Nml Nml Nml N/A  Right PronatorTeres Nml Nml Nml *1- *1+ *1+ *1+ Nml N/A  Right Biceps Nml Nml Nml *1- *1+ *1+ *1+ Nml N/A  Right Triceps Nml Nml Nml Nml Nml Nml Nml Nml N/A  Right Deltoid Nml Nml Nml *1- *1+ *1+ *1+ Nml N/A  Right AntTibialis Nml Nml Nml *1- *1+ *1+ *1+ Nml N/A  Right Gastroc Nml Nml Nml *1- *1+ *1+ *1+ Nml N/A  Right Flex Dig Long Nml Nml Nml *1- *1+ *1+ *1+ Nml N/A  Right RectFemoris Nml Nml Nml Nml Nml Nml Nml Nml N/A  Right BicepsFemS Nml Nml Nml *1- *1+ *1+ *1+ Nml N/A      Waveforms:

## 2022-07-22 ENCOUNTER — Telehealth: Payer: Self-pay

## 2022-07-22 NOTE — Telephone Encounter (Signed)
-----   Message from Glendale Chard, DO sent at 07/19/2022  4:18 PM EDT ----- Please let pt know nerve testing shows nerve impingement in the neck and lumbar region. She has been referred to see neurosurgery already.  In addition, there is right carpal tunnel syndrome, which would explain her hand tingling.  I recommend she start using a wrist brace. Thank you.

## 2022-07-22 NOTE — Telephone Encounter (Signed)
Pt called an informed nerve testing shows nerve impingement in the neck and lumbar region. She has been referred to see neurosurgery already. In addition, there is right carpal tunnel syndrome, which would explain her hand tingling. Dr Allena Katz recommend she start using a wrist brace. Pt stated she will talk to her daughter

## 2022-07-24 NOTE — Telephone Encounter (Signed)
Pt called said she needed a referral to Marikay Alar at neuro surgery. Will place order in epic

## 2022-07-24 NOTE — Telephone Encounter (Signed)
F/u    Patient is asking for a call back to discuss nerve testing and referral.

## 2022-07-25 ENCOUNTER — Encounter: Payer: Self-pay | Admitting: Neurology

## 2022-08-26 NOTE — Telephone Encounter (Signed)
error 

## 2022-09-12 DIAGNOSIS — Z79899 Other long term (current) drug therapy: Secondary | ICD-10-CM | POA: Diagnosis not present

## 2022-10-24 DIAGNOSIS — D485 Neoplasm of uncertain behavior of skin: Secondary | ICD-10-CM | POA: Diagnosis not present

## 2022-10-24 DIAGNOSIS — B079 Viral wart, unspecified: Secondary | ICD-10-CM | POA: Diagnosis not present

## 2022-10-24 DIAGNOSIS — L570 Actinic keratosis: Secondary | ICD-10-CM | POA: Diagnosis not present

## 2022-11-05 DIAGNOSIS — R0789 Other chest pain: Secondary | ICD-10-CM | POA: Diagnosis not present

## 2022-11-05 DIAGNOSIS — Z299 Encounter for prophylactic measures, unspecified: Secondary | ICD-10-CM | POA: Diagnosis not present

## 2022-11-05 DIAGNOSIS — I10 Essential (primary) hypertension: Secondary | ICD-10-CM | POA: Diagnosis not present

## 2022-11-06 DIAGNOSIS — M25562 Pain in left knee: Secondary | ICD-10-CM | POA: Diagnosis not present

## 2022-11-06 DIAGNOSIS — Z79899 Other long term (current) drug therapy: Secondary | ICD-10-CM | POA: Diagnosis not present

## 2022-11-06 DIAGNOSIS — M25561 Pain in right knee: Secondary | ICD-10-CM | POA: Diagnosis not present

## 2022-11-06 DIAGNOSIS — G8929 Other chronic pain: Secondary | ICD-10-CM | POA: Diagnosis not present

## 2022-11-06 DIAGNOSIS — R110 Nausea: Secondary | ICD-10-CM | POA: Diagnosis not present

## 2022-12-04 DIAGNOSIS — M25562 Pain in left knee: Secondary | ICD-10-CM | POA: Diagnosis not present

## 2022-12-04 DIAGNOSIS — M25561 Pain in right knee: Secondary | ICD-10-CM | POA: Diagnosis not present

## 2022-12-04 DIAGNOSIS — R110 Nausea: Secondary | ICD-10-CM | POA: Diagnosis not present

## 2022-12-04 DIAGNOSIS — Z79899 Other long term (current) drug therapy: Secondary | ICD-10-CM | POA: Diagnosis not present

## 2022-12-04 DIAGNOSIS — G8929 Other chronic pain: Secondary | ICD-10-CM | POA: Diagnosis not present

## 2022-12-24 DIAGNOSIS — Z1389 Encounter for screening for other disorder: Secondary | ICD-10-CM | POA: Diagnosis not present

## 2022-12-24 DIAGNOSIS — Z0000 Encounter for general adult medical examination without abnormal findings: Secondary | ICD-10-CM | POA: Diagnosis not present

## 2022-12-24 DIAGNOSIS — I700 Atherosclerosis of aorta: Secondary | ICD-10-CM | POA: Diagnosis not present

## 2022-12-24 DIAGNOSIS — Z299 Encounter for prophylactic measures, unspecified: Secondary | ICD-10-CM | POA: Diagnosis not present

## 2022-12-24 DIAGNOSIS — I10 Essential (primary) hypertension: Secondary | ICD-10-CM | POA: Diagnosis not present

## 2022-12-24 DIAGNOSIS — Z7189 Other specified counseling: Secondary | ICD-10-CM | POA: Diagnosis not present

## 2023-01-01 DIAGNOSIS — M25561 Pain in right knee: Secondary | ICD-10-CM | POA: Diagnosis not present

## 2023-01-01 DIAGNOSIS — M539 Dorsopathy, unspecified: Secondary | ICD-10-CM | POA: Diagnosis not present

## 2023-01-01 DIAGNOSIS — M542 Cervicalgia: Secondary | ICD-10-CM | POA: Diagnosis not present

## 2023-01-01 DIAGNOSIS — M25562 Pain in left knee: Secondary | ICD-10-CM | POA: Diagnosis not present

## 2023-01-01 DIAGNOSIS — Z79899 Other long term (current) drug therapy: Secondary | ICD-10-CM | POA: Diagnosis not present

## 2023-01-01 DIAGNOSIS — M5441 Lumbago with sciatica, right side: Secondary | ICD-10-CM | POA: Diagnosis not present

## 2023-01-01 DIAGNOSIS — G8929 Other chronic pain: Secondary | ICD-10-CM | POA: Diagnosis not present

## 2023-01-01 DIAGNOSIS — K5010 Crohn's disease of large intestine without complications: Secondary | ICD-10-CM | POA: Diagnosis not present

## 2023-01-01 DIAGNOSIS — M5442 Lumbago with sciatica, left side: Secondary | ICD-10-CM | POA: Diagnosis not present

## 2023-01-01 DIAGNOSIS — R110 Nausea: Secondary | ICD-10-CM | POA: Diagnosis not present

## 2023-01-03 DIAGNOSIS — Z79899 Other long term (current) drug therapy: Secondary | ICD-10-CM | POA: Diagnosis not present

## 2023-01-03 DIAGNOSIS — K047 Periapical abscess without sinus: Secondary | ICD-10-CM | POA: Diagnosis not present

## 2023-01-23 DIAGNOSIS — L570 Actinic keratosis: Secondary | ICD-10-CM | POA: Diagnosis not present

## 2023-01-29 DIAGNOSIS — M539 Dorsopathy, unspecified: Secondary | ICD-10-CM | POA: Diagnosis not present

## 2023-01-29 DIAGNOSIS — M25561 Pain in right knee: Secondary | ICD-10-CM | POA: Diagnosis not present

## 2023-01-29 DIAGNOSIS — G8929 Other chronic pain: Secondary | ICD-10-CM | POA: Diagnosis not present

## 2023-01-29 DIAGNOSIS — Z79899 Other long term (current) drug therapy: Secondary | ICD-10-CM | POA: Diagnosis not present

## 2023-01-29 DIAGNOSIS — M5441 Lumbago with sciatica, right side: Secondary | ICD-10-CM | POA: Diagnosis not present

## 2023-01-29 DIAGNOSIS — K5010 Crohn's disease of large intestine without complications: Secondary | ICD-10-CM | POA: Diagnosis not present

## 2023-01-29 DIAGNOSIS — M542 Cervicalgia: Secondary | ICD-10-CM | POA: Diagnosis not present

## 2023-01-29 DIAGNOSIS — N10 Acute tubulo-interstitial nephritis: Secondary | ICD-10-CM | POA: Diagnosis not present

## 2023-01-29 DIAGNOSIS — M5442 Lumbago with sciatica, left side: Secondary | ICD-10-CM | POA: Diagnosis not present

## 2023-01-29 DIAGNOSIS — M25562 Pain in left knee: Secondary | ICD-10-CM | POA: Diagnosis not present

## 2023-01-29 DIAGNOSIS — R110 Nausea: Secondary | ICD-10-CM | POA: Diagnosis not present

## 2023-02-07 DIAGNOSIS — R059 Cough, unspecified: Secondary | ICD-10-CM | POA: Diagnosis not present

## 2023-02-07 DIAGNOSIS — R52 Pain, unspecified: Secondary | ICD-10-CM | POA: Diagnosis not present

## 2023-02-07 DIAGNOSIS — Z299 Encounter for prophylactic measures, unspecified: Secondary | ICD-10-CM | POA: Diagnosis not present

## 2023-02-07 DIAGNOSIS — J069 Acute upper respiratory infection, unspecified: Secondary | ICD-10-CM | POA: Diagnosis not present

## 2023-02-07 DIAGNOSIS — R062 Wheezing: Secondary | ICD-10-CM | POA: Diagnosis not present

## 2023-02-10 ENCOUNTER — Encounter: Payer: Self-pay | Admitting: Neurology

## 2023-02-10 ENCOUNTER — Telehealth (INDEPENDENT_AMBULATORY_CARE_PROVIDER_SITE_OTHER): Payer: 59 | Admitting: Neurology

## 2023-02-10 VITALS — Ht 68.0 in | Wt 198.0 lb

## 2023-02-10 DIAGNOSIS — G629 Polyneuropathy, unspecified: Secondary | ICD-10-CM | POA: Diagnosis not present

## 2023-02-10 DIAGNOSIS — M5410 Radiculopathy, site unspecified: Secondary | ICD-10-CM | POA: Diagnosis not present

## 2023-02-10 MED ORDER — GABAPENTIN 400 MG PO CAPS: ORAL_CAPSULE | ORAL | 11 refills | Status: DC
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
## 2023-02-10 NOTE — Patient Instructions (Addendum)
I hope you feel better soon! Continue all your medications. Use wrist splint for right carpal tunnel syndrome. Follow-up in 6 months, call for any changes.

## 2023-02-19 DIAGNOSIS — R0981 Nasal congestion: Secondary | ICD-10-CM | POA: Diagnosis not present

## 2023-02-19 DIAGNOSIS — Z299 Encounter for prophylactic measures, unspecified: Secondary | ICD-10-CM | POA: Diagnosis not present

## 2023-02-21 DIAGNOSIS — Z79899 Other long term (current) drug therapy: Secondary | ICD-10-CM | POA: Diagnosis not present

## 2023-02-21 DIAGNOSIS — M5441 Lumbago with sciatica, right side: Secondary | ICD-10-CM | POA: Diagnosis not present

## 2023-02-21 DIAGNOSIS — M5442 Lumbago with sciatica, left side: Secondary | ICD-10-CM | POA: Diagnosis not present

## 2023-02-21 DIAGNOSIS — M25561 Pain in right knee: Secondary | ICD-10-CM | POA: Diagnosis not present

## 2023-02-21 DIAGNOSIS — M539 Dorsopathy, unspecified: Secondary | ICD-10-CM | POA: Diagnosis not present

## 2023-02-21 DIAGNOSIS — N10 Acute tubulo-interstitial nephritis: Secondary | ICD-10-CM | POA: Diagnosis not present

## 2023-02-21 DIAGNOSIS — R110 Nausea: Secondary | ICD-10-CM | POA: Diagnosis not present

## 2023-02-21 DIAGNOSIS — M25562 Pain in left knee: Secondary | ICD-10-CM | POA: Diagnosis not present

## 2023-02-21 DIAGNOSIS — K5010 Crohn's disease of large intestine without complications: Secondary | ICD-10-CM | POA: Diagnosis not present

## 2023-02-21 DIAGNOSIS — G8929 Other chronic pain: Secondary | ICD-10-CM | POA: Diagnosis not present

## 2023-02-21 DIAGNOSIS — M542 Cervicalgia: Secondary | ICD-10-CM | POA: Diagnosis not present

## 2023-02-26 DIAGNOSIS — Z299 Encounter for prophylactic measures, unspecified: Secondary | ICD-10-CM | POA: Diagnosis not present

## 2023-02-26 DIAGNOSIS — Z09 Encounter for follow-up examination after completed treatment for conditions other than malignant neoplasm: Secondary | ICD-10-CM | POA: Diagnosis not present

## 2023-02-26 DIAGNOSIS — R9389 Abnormal findings on diagnostic imaging of other specified body structures: Secondary | ICD-10-CM | POA: Diagnosis not present

## 2023-02-26 DIAGNOSIS — R0981 Nasal congestion: Secondary | ICD-10-CM | POA: Diagnosis not present

## 2023-02-26 DIAGNOSIS — I10 Essential (primary) hypertension: Secondary | ICD-10-CM | POA: Diagnosis not present

## 2023-03-21 DIAGNOSIS — G8929 Other chronic pain: Secondary | ICD-10-CM | POA: Diagnosis not present

## 2023-03-21 DIAGNOSIS — M542 Cervicalgia: Secondary | ICD-10-CM | POA: Diagnosis not present

## 2023-03-21 DIAGNOSIS — K5010 Crohn's disease of large intestine without complications: Secondary | ICD-10-CM | POA: Diagnosis not present

## 2023-03-21 DIAGNOSIS — M25561 Pain in right knee: Secondary | ICD-10-CM | POA: Diagnosis not present

## 2023-03-21 DIAGNOSIS — M5442 Lumbago with sciatica, left side: Secondary | ICD-10-CM | POA: Diagnosis not present

## 2023-03-21 DIAGNOSIS — M539 Dorsopathy, unspecified: Secondary | ICD-10-CM | POA: Diagnosis not present

## 2023-03-21 DIAGNOSIS — M25562 Pain in left knee: Secondary | ICD-10-CM | POA: Diagnosis not present

## 2023-03-21 DIAGNOSIS — R110 Nausea: Secondary | ICD-10-CM | POA: Diagnosis not present

## 2023-03-21 DIAGNOSIS — M5441 Lumbago with sciatica, right side: Secondary | ICD-10-CM | POA: Diagnosis not present

## 2023-03-21 DIAGNOSIS — Z79899 Other long term (current) drug therapy: Secondary | ICD-10-CM | POA: Diagnosis not present

## 2023-03-21 DIAGNOSIS — N10 Acute tubulo-interstitial nephritis: Secondary | ICD-10-CM | POA: Diagnosis not present

## 2023-04-18 DIAGNOSIS — K5010 Crohn's disease of large intestine without complications: Secondary | ICD-10-CM | POA: Diagnosis not present

## 2023-04-18 DIAGNOSIS — M539 Dorsopathy, unspecified: Secondary | ICD-10-CM | POA: Diagnosis not present

## 2023-04-18 DIAGNOSIS — M5441 Lumbago with sciatica, right side: Secondary | ICD-10-CM | POA: Diagnosis not present

## 2023-04-18 DIAGNOSIS — G8929 Other chronic pain: Secondary | ICD-10-CM | POA: Diagnosis not present

## 2023-04-18 DIAGNOSIS — M25561 Pain in right knee: Secondary | ICD-10-CM | POA: Diagnosis not present

## 2023-04-18 DIAGNOSIS — N10 Acute tubulo-interstitial nephritis: Secondary | ICD-10-CM | POA: Diagnosis not present

## 2023-04-18 DIAGNOSIS — M5442 Lumbago with sciatica, left side: Secondary | ICD-10-CM | POA: Diagnosis not present

## 2023-04-18 DIAGNOSIS — M542 Cervicalgia: Secondary | ICD-10-CM | POA: Diagnosis not present

## 2023-04-18 DIAGNOSIS — R110 Nausea: Secondary | ICD-10-CM | POA: Diagnosis not present

## 2023-04-18 DIAGNOSIS — Z79899 Other long term (current) drug therapy: Secondary | ICD-10-CM | POA: Diagnosis not present

## 2023-04-18 DIAGNOSIS — M25562 Pain in left knee: Secondary | ICD-10-CM | POA: Diagnosis not present

## 2023-04-21 DIAGNOSIS — L570 Actinic keratosis: Secondary | ICD-10-CM | POA: Diagnosis not present

## 2023-04-21 DIAGNOSIS — D485 Neoplasm of uncertain behavior of skin: Secondary | ICD-10-CM | POA: Diagnosis not present

## 2023-04-21 DIAGNOSIS — C44722 Squamous cell carcinoma of skin of right lower limb, including hip: Secondary | ICD-10-CM | POA: Diagnosis not present

## 2023-04-21 DIAGNOSIS — C44729 Squamous cell carcinoma of skin of left lower limb, including hip: Secondary | ICD-10-CM | POA: Diagnosis not present

## 2023-04-30 DIAGNOSIS — K5010 Crohn's disease of large intestine without complications: Secondary | ICD-10-CM | POA: Diagnosis not present

## 2023-04-30 DIAGNOSIS — I10 Essential (primary) hypertension: Secondary | ICD-10-CM | POA: Diagnosis not present

## 2023-04-30 DIAGNOSIS — Z299 Encounter for prophylactic measures, unspecified: Secondary | ICD-10-CM | POA: Diagnosis not present

## 2023-04-30 DIAGNOSIS — I700 Atherosclerosis of aorta: Secondary | ICD-10-CM | POA: Diagnosis not present

## 2023-04-30 DIAGNOSIS — M542 Cervicalgia: Secondary | ICD-10-CM | POA: Diagnosis not present

## 2023-05-01 DIAGNOSIS — C44722 Squamous cell carcinoma of skin of right lower limb, including hip: Secondary | ICD-10-CM | POA: Diagnosis not present

## 2023-05-01 DIAGNOSIS — C44729 Squamous cell carcinoma of skin of left lower limb, including hip: Secondary | ICD-10-CM | POA: Diagnosis not present

## 2023-05-07 DIAGNOSIS — M5030 Other cervical disc degeneration, unspecified cervical region: Secondary | ICD-10-CM | POA: Diagnosis not present

## 2023-05-07 DIAGNOSIS — M4722 Other spondylosis with radiculopathy, cervical region: Secondary | ICD-10-CM | POA: Diagnosis not present

## 2023-05-07 DIAGNOSIS — M4802 Spinal stenosis, cervical region: Secondary | ICD-10-CM | POA: Diagnosis not present

## 2023-05-07 DIAGNOSIS — M47812 Spondylosis without myelopathy or radiculopathy, cervical region: Secondary | ICD-10-CM | POA: Diagnosis not present

## 2023-05-15 DIAGNOSIS — M542 Cervicalgia: Secondary | ICD-10-CM | POA: Diagnosis not present

## 2023-05-15 DIAGNOSIS — N10 Acute tubulo-interstitial nephritis: Secondary | ICD-10-CM | POA: Diagnosis not present

## 2023-05-15 DIAGNOSIS — R110 Nausea: Secondary | ICD-10-CM | POA: Diagnosis not present

## 2023-05-15 DIAGNOSIS — M5441 Lumbago with sciatica, right side: Secondary | ICD-10-CM | POA: Diagnosis not present

## 2023-05-15 DIAGNOSIS — M25562 Pain in left knee: Secondary | ICD-10-CM | POA: Diagnosis not present

## 2023-05-15 DIAGNOSIS — M25561 Pain in right knee: Secondary | ICD-10-CM | POA: Diagnosis not present

## 2023-05-15 DIAGNOSIS — K5010 Crohn's disease of large intestine without complications: Secondary | ICD-10-CM | POA: Diagnosis not present

## 2023-05-15 DIAGNOSIS — G8929 Other chronic pain: Secondary | ICD-10-CM | POA: Diagnosis not present

## 2023-05-15 DIAGNOSIS — Z79899 Other long term (current) drug therapy: Secondary | ICD-10-CM | POA: Diagnosis not present

## 2023-05-15 DIAGNOSIS — M5442 Lumbago with sciatica, left side: Secondary | ICD-10-CM | POA: Diagnosis not present

## 2023-05-15 DIAGNOSIS — M539 Dorsopathy, unspecified: Secondary | ICD-10-CM | POA: Diagnosis not present

## 2023-06-03 ENCOUNTER — Other Ambulatory Visit: Payer: Self-pay | Admitting: Gastroenterology

## 2023-06-03 DIAGNOSIS — K219 Gastro-esophageal reflux disease without esophagitis: Secondary | ICD-10-CM

## 2023-06-04 DIAGNOSIS — M4722 Other spondylosis with radiculopathy, cervical region: Secondary | ICD-10-CM | POA: Diagnosis not present

## 2023-06-04 DIAGNOSIS — M5030 Other cervical disc degeneration, unspecified cervical region: Secondary | ICD-10-CM | POA: Diagnosis not present

## 2023-06-04 DIAGNOSIS — M4802 Spinal stenosis, cervical region: Secondary | ICD-10-CM | POA: Diagnosis not present

## 2023-06-11 DIAGNOSIS — R110 Nausea: Secondary | ICD-10-CM | POA: Diagnosis not present

## 2023-06-11 DIAGNOSIS — M25562 Pain in left knee: Secondary | ICD-10-CM | POA: Diagnosis not present

## 2023-06-11 DIAGNOSIS — G8929 Other chronic pain: Secondary | ICD-10-CM | POA: Diagnosis not present

## 2023-06-11 DIAGNOSIS — M539 Dorsopathy, unspecified: Secondary | ICD-10-CM | POA: Diagnosis not present

## 2023-06-11 DIAGNOSIS — M5442 Lumbago with sciatica, left side: Secondary | ICD-10-CM | POA: Diagnosis not present

## 2023-06-11 DIAGNOSIS — M5441 Lumbago with sciatica, right side: Secondary | ICD-10-CM | POA: Diagnosis not present

## 2023-06-11 DIAGNOSIS — K5010 Crohn's disease of large intestine without complications: Secondary | ICD-10-CM | POA: Diagnosis not present

## 2023-06-11 DIAGNOSIS — M25561 Pain in right knee: Secondary | ICD-10-CM | POA: Diagnosis not present

## 2023-06-11 DIAGNOSIS — N10 Acute tubulo-interstitial nephritis: Secondary | ICD-10-CM | POA: Diagnosis not present

## 2023-06-11 DIAGNOSIS — Z79899 Other long term (current) drug therapy: Secondary | ICD-10-CM | POA: Diagnosis not present

## 2023-06-11 DIAGNOSIS — M542 Cervicalgia: Secondary | ICD-10-CM | POA: Diagnosis not present

## 2023-07-02 DIAGNOSIS — M7918 Myalgia, other site: Secondary | ICD-10-CM | POA: Diagnosis not present

## 2023-07-02 DIAGNOSIS — M4722 Other spondylosis with radiculopathy, cervical region: Secondary | ICD-10-CM | POA: Diagnosis not present

## 2023-07-02 DIAGNOSIS — M47812 Spondylosis without myelopathy or radiculopathy, cervical region: Secondary | ICD-10-CM | POA: Diagnosis not present

## 2023-07-02 DIAGNOSIS — M4802 Spinal stenosis, cervical region: Secondary | ICD-10-CM | POA: Diagnosis not present

## 2023-07-02 DIAGNOSIS — M5030 Other cervical disc degeneration, unspecified cervical region: Secondary | ICD-10-CM | POA: Diagnosis not present

## 2023-07-02 DIAGNOSIS — G8929 Other chronic pain: Secondary | ICD-10-CM | POA: Diagnosis not present

## 2023-07-07 DIAGNOSIS — M539 Dorsopathy, unspecified: Secondary | ICD-10-CM | POA: Diagnosis not present

## 2023-07-07 DIAGNOSIS — Z79899 Other long term (current) drug therapy: Secondary | ICD-10-CM | POA: Diagnosis not present

## 2023-07-07 DIAGNOSIS — N10 Acute tubulo-interstitial nephritis: Secondary | ICD-10-CM | POA: Diagnosis not present

## 2023-07-07 DIAGNOSIS — K5010 Crohn's disease of large intestine without complications: Secondary | ICD-10-CM | POA: Diagnosis not present

## 2023-07-07 DIAGNOSIS — M542 Cervicalgia: Secondary | ICD-10-CM | POA: Diagnosis not present

## 2023-07-15 ENCOUNTER — Other Ambulatory Visit: Payer: Self-pay | Admitting: Neurology

## 2023-08-04 DIAGNOSIS — M5441 Lumbago with sciatica, right side: Secondary | ICD-10-CM | POA: Diagnosis not present

## 2023-08-04 DIAGNOSIS — Z79899 Other long term (current) drug therapy: Secondary | ICD-10-CM | POA: Diagnosis not present

## 2023-08-04 DIAGNOSIS — M5442 Lumbago with sciatica, left side: Secondary | ICD-10-CM | POA: Diagnosis not present

## 2023-08-04 DIAGNOSIS — M25561 Pain in right knee: Secondary | ICD-10-CM | POA: Diagnosis not present

## 2023-08-04 DIAGNOSIS — K5010 Crohn's disease of large intestine without complications: Secondary | ICD-10-CM | POA: Diagnosis not present

## 2023-08-04 DIAGNOSIS — N10 Acute tubulo-interstitial nephritis: Secondary | ICD-10-CM | POA: Diagnosis not present

## 2023-08-04 DIAGNOSIS — M542 Cervicalgia: Secondary | ICD-10-CM | POA: Diagnosis not present

## 2023-08-04 DIAGNOSIS — M539 Dorsopathy, unspecified: Secondary | ICD-10-CM | POA: Diagnosis not present

## 2023-08-04 DIAGNOSIS — M25562 Pain in left knee: Secondary | ICD-10-CM | POA: Diagnosis not present

## 2023-08-04 DIAGNOSIS — R110 Nausea: Secondary | ICD-10-CM | POA: Diagnosis not present

## 2023-08-13 DIAGNOSIS — M4802 Spinal stenosis, cervical region: Secondary | ICD-10-CM | POA: Diagnosis not present

## 2023-08-13 DIAGNOSIS — M4722 Other spondylosis with radiculopathy, cervical region: Secondary | ICD-10-CM | POA: Diagnosis not present

## 2023-08-13 DIAGNOSIS — M5030 Other cervical disc degeneration, unspecified cervical region: Secondary | ICD-10-CM | POA: Diagnosis not present

## 2023-08-20 DIAGNOSIS — M4722 Other spondylosis with radiculopathy, cervical region: Secondary | ICD-10-CM | POA: Diagnosis not present

## 2023-08-20 DIAGNOSIS — M4802 Spinal stenosis, cervical region: Secondary | ICD-10-CM | POA: Diagnosis not present

## 2023-08-20 DIAGNOSIS — M5030 Other cervical disc degeneration, unspecified cervical region: Secondary | ICD-10-CM | POA: Diagnosis not present

## 2023-09-04 DIAGNOSIS — M539 Dorsopathy, unspecified: Secondary | ICD-10-CM | POA: Diagnosis not present

## 2023-09-04 DIAGNOSIS — M25562 Pain in left knee: Secondary | ICD-10-CM | POA: Diagnosis not present

## 2023-09-04 DIAGNOSIS — M79641 Pain in right hand: Secondary | ICD-10-CM | POA: Diagnosis not present

## 2023-09-04 DIAGNOSIS — M79642 Pain in left hand: Secondary | ICD-10-CM | POA: Diagnosis not present

## 2023-09-04 DIAGNOSIS — M25561 Pain in right knee: Secondary | ICD-10-CM | POA: Diagnosis not present

## 2023-09-04 DIAGNOSIS — M5442 Lumbago with sciatica, left side: Secondary | ICD-10-CM | POA: Diagnosis not present

## 2023-09-04 DIAGNOSIS — M5441 Lumbago with sciatica, right side: Secondary | ICD-10-CM | POA: Diagnosis not present

## 2023-09-04 DIAGNOSIS — R5383 Other fatigue: Secondary | ICD-10-CM | POA: Diagnosis not present

## 2023-09-04 DIAGNOSIS — M542 Cervicalgia: Secondary | ICD-10-CM | POA: Diagnosis not present

## 2023-09-04 DIAGNOSIS — M546 Pain in thoracic spine: Secondary | ICD-10-CM | POA: Diagnosis not present

## 2023-09-04 DIAGNOSIS — D539 Nutritional anemia, unspecified: Secondary | ICD-10-CM | POA: Diagnosis not present

## 2023-09-04 DIAGNOSIS — M129 Arthropathy, unspecified: Secondary | ICD-10-CM | POA: Diagnosis not present

## 2023-09-04 DIAGNOSIS — E559 Vitamin D deficiency, unspecified: Secondary | ICD-10-CM | POA: Diagnosis not present

## 2023-09-04 DIAGNOSIS — G8929 Other chronic pain: Secondary | ICD-10-CM | POA: Diagnosis not present

## 2023-09-04 DIAGNOSIS — Z79899 Other long term (current) drug therapy: Secondary | ICD-10-CM | POA: Diagnosis not present

## 2023-09-08 DIAGNOSIS — K5010 Crohn's disease of large intestine without complications: Secondary | ICD-10-CM | POA: Diagnosis not present

## 2023-09-08 DIAGNOSIS — I10 Essential (primary) hypertension: Secondary | ICD-10-CM | POA: Diagnosis not present

## 2023-09-08 DIAGNOSIS — Z299 Encounter for prophylactic measures, unspecified: Secondary | ICD-10-CM | POA: Diagnosis not present

## 2023-09-08 DIAGNOSIS — R220 Localized swelling, mass and lump, head: Secondary | ICD-10-CM | POA: Diagnosis not present

## 2023-09-08 DIAGNOSIS — R52 Pain, unspecified: Secondary | ICD-10-CM | POA: Diagnosis not present

## 2023-09-08 DIAGNOSIS — K0889 Other specified disorders of teeth and supporting structures: Secondary | ICD-10-CM | POA: Diagnosis not present

## 2023-09-09 ENCOUNTER — Telehealth: Payer: 59 | Admitting: Neurology

## 2023-09-09 ENCOUNTER — Encounter: Payer: Self-pay | Admitting: Neurology

## 2023-09-09 VITALS — Ht 68.0 in | Wt 203.0 lb

## 2023-09-09 DIAGNOSIS — G629 Polyneuropathy, unspecified: Secondary | ICD-10-CM | POA: Diagnosis not present

## 2023-09-09 MED ORDER — GABAPENTIN 400 MG PO CAPS: ORAL_CAPSULE | ORAL | 11 refills | Status: DC

## 2023-09-09 NOTE — Patient Instructions (Addendum)
 I hope you feel better soon!  Try the Lidocaine  cream or ointment 5% over the counter.   2. Continue Gabapentin  400mg : Take 2 caps in AM, 3 caps in PM  3. Let me know if lidocaine  is not helpful and if you would like to add a different medication to hopefully help with neuropathy  4. Follow-up in 6 months, call for any changes
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
## 2023-10-06 DIAGNOSIS — M5442 Lumbago with sciatica, left side: Secondary | ICD-10-CM | POA: Diagnosis not present

## 2023-10-06 DIAGNOSIS — Z79899 Other long term (current) drug therapy: Secondary | ICD-10-CM | POA: Diagnosis not present

## 2023-10-06 DIAGNOSIS — M5441 Lumbago with sciatica, right side: Secondary | ICD-10-CM | POA: Diagnosis not present

## 2023-10-06 DIAGNOSIS — M539 Dorsopathy, unspecified: Secondary | ICD-10-CM | POA: Diagnosis not present

## 2023-10-06 DIAGNOSIS — G8929 Other chronic pain: Secondary | ICD-10-CM | POA: Diagnosis not present

## 2023-10-16 DIAGNOSIS — Z79899 Other long term (current) drug therapy: Secondary | ICD-10-CM | POA: Diagnosis not present

## 2023-10-16 DIAGNOSIS — I10 Essential (primary) hypertension: Secondary | ICD-10-CM | POA: Diagnosis not present

## 2023-10-16 DIAGNOSIS — K5010 Crohn's disease of large intestine without complications: Secondary | ICD-10-CM | POA: Diagnosis not present

## 2023-10-16 DIAGNOSIS — R5383 Other fatigue: Secondary | ICD-10-CM | POA: Diagnosis not present

## 2023-10-16 DIAGNOSIS — Z299 Encounter for prophylactic measures, unspecified: Secondary | ICD-10-CM | POA: Diagnosis not present

## 2023-10-16 DIAGNOSIS — Z7189 Other specified counseling: Secondary | ICD-10-CM | POA: Diagnosis not present

## 2023-10-16 DIAGNOSIS — R3911 Hesitancy of micturition: Secondary | ICD-10-CM | POA: Diagnosis not present

## 2023-10-16 DIAGNOSIS — Z0000 Encounter for general adult medical examination without abnormal findings: Secondary | ICD-10-CM | POA: Diagnosis not present

## 2023-11-08 DIAGNOSIS — G8929 Other chronic pain: Secondary | ICD-10-CM | POA: Diagnosis not present

## 2023-11-08 DIAGNOSIS — M797 Fibromyalgia: Secondary | ICD-10-CM | POA: Diagnosis not present

## 2023-11-08 DIAGNOSIS — Z79899 Other long term (current) drug therapy: Secondary | ICD-10-CM | POA: Diagnosis not present

## 2023-11-08 DIAGNOSIS — M5441 Lumbago with sciatica, right side: Secondary | ICD-10-CM | POA: Diagnosis not present

## 2023-11-08 DIAGNOSIS — M25561 Pain in right knee: Secondary | ICD-10-CM | POA: Diagnosis not present

## 2023-11-08 DIAGNOSIS — M25562 Pain in left knee: Secondary | ICD-10-CM | POA: Diagnosis not present

## 2023-11-08 DIAGNOSIS — M5442 Lumbago with sciatica, left side: Secondary | ICD-10-CM | POA: Diagnosis not present

## 2023-11-11 ENCOUNTER — Other Ambulatory Visit: Payer: Self-pay | Admitting: Family Medicine

## 2023-11-11 DIAGNOSIS — Z1231 Encounter for screening mammogram for malignant neoplasm of breast: Secondary | ICD-10-CM

## 2023-11-13 DIAGNOSIS — Z79899 Other long term (current) drug therapy: Secondary | ICD-10-CM | POA: Diagnosis not present

## 2023-11-18 ENCOUNTER — Inpatient Hospital Stay: Admission: RE | Admit: 2023-11-18 | Source: Ambulatory Visit

## 2023-11-26 ENCOUNTER — Inpatient Hospital Stay: Admission: RE | Admit: 2023-11-26 | Source: Ambulatory Visit

## 2023-12-05 DIAGNOSIS — G8929 Other chronic pain: Secondary | ICD-10-CM | POA: Diagnosis not present

## 2023-12-05 DIAGNOSIS — R030 Elevated blood-pressure reading, without diagnosis of hypertension: Secondary | ICD-10-CM | POA: Diagnosis not present

## 2023-12-05 DIAGNOSIS — K5010 Crohn's disease of large intestine without complications: Secondary | ICD-10-CM | POA: Diagnosis not present

## 2023-12-05 DIAGNOSIS — Z23 Encounter for immunization: Secondary | ICD-10-CM | POA: Diagnosis not present

## 2023-12-05 DIAGNOSIS — M818 Other osteoporosis without current pathological fracture: Secondary | ICD-10-CM | POA: Diagnosis not present

## 2023-12-05 DIAGNOSIS — M797 Fibromyalgia: Secondary | ICD-10-CM | POA: Diagnosis not present

## 2023-12-25 ENCOUNTER — Encounter: Payer: Self-pay | Admitting: Neurology

## 2023-12-31 ENCOUNTER — Inpatient Hospital Stay: Admission: RE | Admit: 2023-12-31 | Source: Ambulatory Visit

## 2024-01-13 ENCOUNTER — Ambulatory Visit
Admission: RE | Admit: 2024-01-13 | Discharge: 2024-01-13 | Disposition: A | Source: Ambulatory Visit | Attending: Family Medicine | Admitting: Family Medicine

## 2024-01-13 DIAGNOSIS — Z1231 Encounter for screening mammogram for malignant neoplasm of breast: Secondary | ICD-10-CM

## 2024-01-21 ENCOUNTER — Other Ambulatory Visit: Payer: Self-pay | Admitting: Family Medicine

## 2024-01-21 DIAGNOSIS — R928 Other abnormal and inconclusive findings on diagnostic imaging of breast: Secondary | ICD-10-CM

## 2024-01-29 ENCOUNTER — Other Ambulatory Visit: Payer: Self-pay | Admitting: Family Medicine

## 2024-01-29 ENCOUNTER — Inpatient Hospital Stay: Admission: RE | Admit: 2024-01-29 | Discharge: 2024-01-29 | Attending: Family Medicine | Admitting: Family Medicine

## 2024-01-29 DIAGNOSIS — R928 Other abnormal and inconclusive findings on diagnostic imaging of breast: Secondary | ICD-10-CM

## 2024-01-29 DIAGNOSIS — R921 Mammographic calcification found on diagnostic imaging of breast: Secondary | ICD-10-CM

## 2024-02-23 ENCOUNTER — Encounter

## 2024-02-26 ENCOUNTER — Ambulatory Visit
Admission: RE | Admit: 2024-02-26 | Discharge: 2024-02-26 | Disposition: A | Source: Ambulatory Visit | Attending: Family Medicine | Admitting: Family Medicine

## 2024-02-26 ENCOUNTER — Other Ambulatory Visit: Payer: Self-pay | Admitting: Family Medicine

## 2024-02-26 DIAGNOSIS — R921 Mammographic calcification found on diagnostic imaging of breast: Secondary | ICD-10-CM

## 2024-02-26 DIAGNOSIS — R928 Other abnormal and inconclusive findings on diagnostic imaging of breast: Secondary | ICD-10-CM

## 2024-02-26 HISTORY — PX: BREAST BIOPSY: SHX20

## 2024-02-27 LAB — SURGICAL PATHOLOGY

## 2024-03-08 ENCOUNTER — Other Ambulatory Visit: Payer: Self-pay | Admitting: Neurology

## 2024-03-19 ENCOUNTER — Ambulatory Visit: Payer: Self-pay | Admitting: Surgery

## 2024-04-16 ENCOUNTER — Ambulatory Visit: Admitting: Neurology
